# Patient Record
Sex: Female | Born: 1975
Health system: Southern US, Community
[De-identification: ages and names within clinical notes are randomized; demographics above are authoritative.]

## PROBLEM LIST (undated history)

## (undated) DIAGNOSIS — I10 Essential (primary) hypertension: Secondary | ICD-10-CM

---
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
## 2012-05-22 ENCOUNTER — Emergency Department (HOSPITAL_BASED_OUTPATIENT_CLINIC_OR_DEPARTMENT_OTHER)
Admission: EM | Admit: 2012-05-22 | Discharge: 2012-05-22 | Disposition: A | Discharge status: Home / Self Care | Payer: BC Managed Care – PPO | Attending: Emergency Medicine | Admitting: Emergency Medicine

## 2012-05-22 ENCOUNTER — Encounter (HOSPITAL_BASED_OUTPATIENT_CLINIC_OR_DEPARTMENT_OTHER): Payer: Self-pay | Admitting: Emergency Medicine

## 2012-05-22 DIAGNOSIS — I10 Essential (primary) hypertension: Secondary | ICD-10-CM | POA: Insufficient documentation

## 2012-05-22 DIAGNOSIS — R002 Palpitations: Secondary | ICD-10-CM | POA: Insufficient documentation

## 2012-05-22 HISTORY — DX: Essential (primary) hypertension: I10

## 2012-05-22 LAB — BASIC METABOLIC PANEL
BUN: 11 mg/dL (ref 6–23)
Calcium: 9.3 mg/dL (ref 8.4–10.5)
GFR calc Af Amer: 84 mL/min — ABNORMAL LOW (ref >90)
GFR calc non Af Amer: 73 mL/min — ABNORMAL LOW (ref >90)
Glucose, Bld: 107 mg/dL — ABNORMAL HIGH (ref 70–99)
Sodium: 139 mEq/L (ref 135–145)

## 2012-05-22 LAB — PREGNANCY, URINE: Preg Test, Ur: NEGATIVE

## 2012-05-22 LAB — CBC
MCHC: 34.9 g/dL (ref 30.0–36.0)
MCV: 76.4 fL — ABNORMAL LOW (ref 78.0–100.0)
RBC: 4.5 MIL/uL (ref 3.87–5.11)
RDW: 14.9 % (ref 11.5–15.5)
WBC: 6.2 10*3/uL (ref 4.0–10.5)

## 2012-05-22 MED ORDER — OXYMETAZOLINE HCL 0.05 % NA SOLN
1.0000 | Freq: Once | NASAL | Status: AC
  Administered 2012-05-22: 1 via NASAL

## 2012-05-22 MED ORDER — OXYMETAZOLINE HCL 0.05 % NA SOLN
NASAL | Status: AC
  Administered 2012-05-22: 1 via NASAL
  Filled 2012-05-22: qty 15

## 2012-05-22 NOTE — ED Provider Notes (Signed)
History     CSN: [id number]  Arrival date & time 05/22/12  4098   First MD Initiated Contact with Patient 05/22/12 [phone]      Chief Complaint  Patient presents with  . Dizziness    (Consider location/radiation/quality/duration/timing/severity/associated sxs/prior treatment) HPI Pt presents with c/o feeling lightheaded and palpitations.  Felt that her heart was racing.  She had taken a cold medication (Wal-phed) this morning shortly before symptoms began.  She states she has not had this medication in the past.  It contains pseudoephedrine.  No chest pain, no syncope.  Coworkers called 911.  Pt took the medication for nasal congestion.  She states the palpitations have mostly resolved upon arrival to the ED.  There are no other associated systemic symptoms, there are no other alleviating or modifying factors.   Past Medical History  Diagnosis Date  . Hypertension     History reviewed. No pertinent past surgical history.  No family history on file.  History  Substance Use Topics  . Smoking status: Never Smoker   . Smokeless tobacco: Not on file  . Alcohol Use: No    OB History    Grav Para Term Preterm Abortions TAB SAB Ect Mult Living                  Review of Systems ROS reviewed and all otherwise negative except for mentioned in HPI  Allergies  Review of patient's allergies indicates no known allergies.  Home Medications   Current Outpatient Rx  Name Route Sig Dispense Refill  . AMLODIPINE BESY-BENAZEPRIL HCL 5-40 MG PO CAPS Oral Take 1 capsule by mouth daily.    Marland Kitchen LEVONORGESTREL-ETHINYL ESTRAD 0.1-20 MG-MCG PO TABS Oral Take 1 tablet by mouth daily.      BP 129/84  Pulse 87  Temp 97.4 F (36.3 C) (Oral)  Resp 18  Ht 5\' 7"  (1.702 m)  Wt 313 lb (141.976 kg)  BMI 49.02 kg/m2  SpO2 100%  LMP 05/12/2012 Vitals reviewed Physical Exam Physical Examination: General appearance - alert, well appearing, and in no distress Mental status - alert, oriented to  person, place, and time Eyes - pupils equal and reactive, no conjunctival injection, no scleral icterus Mouth - mucous membranes moist, pharynx normal without lesions Chest - clear to auscultation, no wheezes, rales or rhonchi, symmetric air entry Heart - normal rate, regular rhythm, normal S1, S2, no murmurs, rubs, clicks or gallops Abdomen - soft, nontender, nondistended, no masses or organomegaly Musculoskeletal - no joint tenderness, deformity or swelling Extremities - peripheral pulses normal, no pedal edema, no clubbing or cyanosis Skin - normal coloration and turgor, no rashes  ED Course  Procedures (including critical care time)   Date: 05/22/2012  Rate: 81  Rhythm: normal sinus rhythm  QRS Axis: normal  Intervals: normal  ST/T Wave abnormalities: normal  Conduction Disutrbances: none  Narrative Interpretation: unremarkable     Labs Reviewed  CBC - Abnormal; Notable for the following:    HCT 34.4 (*)     MCV 76.4 (*)     All other components within normal limits  BASIC METABOLIC PANEL - Abnormal; Notable for the following:    Glucose, Bld 107 (*)     GFR calc non Af Amer 73 (*)     GFR calc Af Amer 84 (*)     All other components within normal limits  PREGNANCY, URINE   No results found.   1. Palpitations       MDM  Pt presenting with c/o palpitations after taking decongestant medication.  Pt states symptoms have mostly resolved upon arrival to the ED.  EKG reassuring, labs show no electrolyte abnormalities or anemia.  Urine preg negative.  Vitals also reassuring.  Discharged with strict return precautions.  Pt agreeable with plan.        Ethelda Chick, MD 05/22/12 1246

## 2012-05-22 NOTE — ED Notes (Addendum)
Pt to ED via EMS from work. Pt c/o lightheadedness and "heart racing" after taking Wal-phed at approx 0730.

## 2012-08-17 ENCOUNTER — Encounter (HOSPITAL_BASED_OUTPATIENT_CLINIC_OR_DEPARTMENT_OTHER): Payer: Self-pay | Admitting: *Deleted

## 2012-08-17 ENCOUNTER — Emergency Department (HOSPITAL_BASED_OUTPATIENT_CLINIC_OR_DEPARTMENT_OTHER): Payer: BC Managed Care – PPO

## 2012-08-17 ENCOUNTER — Emergency Department (HOSPITAL_BASED_OUTPATIENT_CLINIC_OR_DEPARTMENT_OTHER)
Admission: EM | Admit: 2012-08-17 | Discharge: 2012-08-17 | Disposition: A | Discharge status: Home / Self Care | Payer: BC Managed Care – PPO | Attending: Emergency Medicine | Admitting: Emergency Medicine

## 2012-08-17 DIAGNOSIS — S63501A Unspecified sprain of right wrist, initial encounter: Secondary | ICD-10-CM

## 2012-08-17 DIAGNOSIS — Z3202 Encounter for pregnancy test, result negative: Secondary | ICD-10-CM | POA: Insufficient documentation

## 2012-08-17 DIAGNOSIS — Z79899 Other long term (current) drug therapy: Secondary | ICD-10-CM | POA: Insufficient documentation

## 2012-08-17 DIAGNOSIS — I10 Essential (primary) hypertension: Secondary | ICD-10-CM | POA: Insufficient documentation

## 2012-08-17 DIAGNOSIS — S63502A Unspecified sprain of left wrist, initial encounter: Secondary | ICD-10-CM

## 2012-08-17 DIAGNOSIS — Y929 Unspecified place or not applicable: Secondary | ICD-10-CM | POA: Insufficient documentation

## 2012-08-17 DIAGNOSIS — S63612A Unspecified sprain of right middle finger, initial encounter: Secondary | ICD-10-CM

## 2012-08-17 DIAGNOSIS — Y9389 Activity, other specified: Secondary | ICD-10-CM | POA: Insufficient documentation

## 2012-08-17 DIAGNOSIS — S20219A Contusion of unspecified front wall of thorax, initial encounter: Secondary | ICD-10-CM

## 2012-08-17 DIAGNOSIS — S39012A Strain of muscle, fascia and tendon of lower back, initial encounter: Secondary | ICD-10-CM

## 2012-08-17 DIAGNOSIS — S161XXA Strain of muscle, fascia and tendon at neck level, initial encounter: Secondary | ICD-10-CM

## 2012-08-17 DIAGNOSIS — S139XXA Sprain of joints and ligaments of unspecified parts of neck, initial encounter: Secondary | ICD-10-CM | POA: Insufficient documentation

## 2012-08-17 DIAGNOSIS — S6390XA Sprain of unspecified part of unspecified wrist and hand, initial encounter: Secondary | ICD-10-CM | POA: Insufficient documentation

## 2012-08-17 DIAGNOSIS — S335XXA Sprain of ligaments of lumbar spine, initial encounter: Secondary | ICD-10-CM | POA: Insufficient documentation

## 2012-08-17 DIAGNOSIS — S63509A Unspecified sprain of unspecified wrist, initial encounter: Secondary | ICD-10-CM | POA: Insufficient documentation

## 2012-08-17 MED ORDER — HYDROCODONE-ACETAMINOPHEN 5-325 MG PO TABS: 2.0000 | ORAL_TABLET | Freq: Four times a day (QID) | ORAL | Status: DC | PRN

## 2012-08-17 MED ORDER — IBUPROFEN 400 MG PO TABS
400.0000 mg | ORAL_TABLET | Freq: Once | ORAL | Status: AC
  Administered 2012-08-17: 400 mg via ORAL
  Filled 2012-08-17: qty 1
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
## 2013-04-04 LAB — HM PAP SMEAR

## 2013-05-03 ENCOUNTER — Emergency Department (HOSPITAL_BASED_OUTPATIENT_CLINIC_OR_DEPARTMENT_OTHER)
Admission: EM | Admit: 2013-05-03 | Discharge: 2013-05-03 | Disposition: A | Discharge status: Home / Self Care | Payer: BC Managed Care – PPO | Attending: Emergency Medicine | Admitting: Emergency Medicine

## 2013-05-03 ENCOUNTER — Encounter (HOSPITAL_BASED_OUTPATIENT_CLINIC_OR_DEPARTMENT_OTHER): Payer: Self-pay | Admitting: *Deleted

## 2013-05-03 DIAGNOSIS — L259 Unspecified contact dermatitis, unspecified cause: Secondary | ICD-10-CM

## 2013-05-03 DIAGNOSIS — Z79899 Other long term (current) drug therapy: Secondary | ICD-10-CM | POA: Insufficient documentation

## 2013-05-03 DIAGNOSIS — I10 Essential (primary) hypertension: Secondary | ICD-10-CM | POA: Insufficient documentation

## 2013-05-03 DIAGNOSIS — R21 Rash and other nonspecific skin eruption: Secondary | ICD-10-CM | POA: Insufficient documentation

## 2013-05-03 MED ORDER — HYDROXYZINE HCL 25 MG PO TABS: 25.0000 mg | ORAL_TABLET | Freq: Four times a day (QID) | ORAL | Status: DC

## 2013-05-03 MED ORDER — PREDNISONE 50 MG PO TABS
60.0000 mg | ORAL_TABLET | Freq: Once | ORAL | Status: AC
  Administered 2013-05-03: 60 mg via ORAL
  Filled 2013-05-03: qty 1

## 2013-05-03 MED ORDER — PREDNISONE 20 MG PO TABS: ORAL_TABLET | ORAL | Status: DC

## 2013-05-03 NOTE — ED Notes (Signed)
No rash noted on hand or feet. Scale of 1 to 10, itch about 7/10. ABC intact.

## 2013-05-03 NOTE — ED Provider Notes (Signed)
CSN: [id number]     Arrival date & time 05/03/13  1609 History  This chart was scribed for Rolan Bucco, MD by Karle Plumber, ED Scribe. This patient was seen in room MH05/MH05 and the patient's care was started at 04:42 PM.    Chief Complaint  Patient presents with  . Pruritis    The history is provided by the patient. No language interpreter was used.   HPI Comments:  Shelia Mcgee is a 37 y.o. female who presents to the Emergency Department complaining of a constant worsening, severe pruritis onset two days ago. She reports it started on bilateral hands and spread to bilateral  feet. She reports some associated mild swelling of the areas. Pt denies any new foods, lotions, creams, or new medications. She denies any recent insect bites.  She states she has taken Benadryl and used some over the counter creams for the symptoms. Pt denies fever, congestion, wheezing, cough, shortness of breath, nausea, vomiting, facial or tongue swelling, rash or any other symptoms. Pt denies smoking or alcohol use.  She reports h/o HTN.    Past Medical History  Diagnosis Date  . Hypertension    History reviewed. No pertinent past surgical history. No family history on file. History  Substance Use Topics  . Smoking status: Never Smoker   . Smokeless tobacco: Not on file  . Alcohol Use: No   OB History   Grav Para Term Preterm Abortions TAB SAB Ect Mult Living                 Review of Systems  Constitutional: Negative for fever, chills, diaphoresis and fatigue.  HENT: Negative for congestion, rhinorrhea and sneezing.   Eyes: Negative.   Respiratory: Negative for cough, chest tightness and shortness of breath.   Cardiovascular: Negative for chest pain and leg swelling.  Gastrointestinal: Negative for nausea, vomiting, abdominal pain, diarrhea and blood in stool.  Genitourinary: Negative for frequency, hematuria, flank pain and difficulty urinating.  Musculoskeletal: Negative for back pain and  arthralgias.  Skin: Positive for rash.  Neurological: Negative for dizziness, speech difficulty, weakness, numbness and headaches.    Allergies  Review of patient's allergies indicates no known allergies.  Home Medications   Current Outpatient Rx  Name  Route  Sig  Dispense  Refill  . amLODipine-benazepril (LOTREL) 5-40 MG per capsule   Oral   Take 1 capsule by mouth daily.         Marland Kitchen HYDROcodone-acetaminophen (NORCO) 5-325 MG per tablet   Oral   Take 2 tablets by mouth every 6 (six) hours as needed for pain.   20 tablet   0   . hydrOXYzine (ATARAX/VISTARIL) 25 MG tablet   Oral   Take 1 tablet (25 mg total) by mouth every 6 (six) hours.   12 tablet   0   . levonorgestrel-ethinyl estradiol (AVIANE,ALESSE,LESSINA) 0.1-20 MG-MCG tablet   Oral   Take 1 tablet by mouth daily.         . predniSONE (DELTASONE) 20 MG tablet      2 tabs po daily x 4 days   8 tablet   0    Triage Vitals: BP 135/94  Pulse 82  Temp(Src) 98.2 F (36.8 C) (Oral)  Resp 20  Ht 5\' 7"  (1.702 m)  Wt 329 lb (149.233 kg)  BMI 51.52 kg/m2  SpO2 100%  LMP 04/04/2013 Physical Exam  Constitutional: She is oriented to person, place, and time. She appears well-developed and well-nourished.  HENT:  Head: Normocephalic and atraumatic.  Eyes: Pupils are equal, round, and reactive to light.  Neck: Normal range of motion. Neck supple.  Cardiovascular: Normal rate, regular rhythm and normal heart sounds.   Pulmonary/Chest: Effort normal and breath sounds normal. No respiratory distress. She has no wheezes. She has no rales. She exhibits no tenderness.  Abdominal: Soft. Bowel sounds are normal. There is no tenderness. There is no rebound and no guarding.  Musculoskeletal: Normal range of motion. She exhibits no edema.  Lymphadenopathy:    She has no cervical adenopathy.  Neurological: [name] and oriented to person, place, and time.  Skin: Skin is warm and dry. No rash noted.  Mild erythema to  the palms of both hands.  Minimal swelling.  No other rash noted.  No scaling.  Psychiatric: She has a normal mood and affect.    ED Course  Procedures (including critical care time) DIAGNOSTIC STUDIES: Oxygen Saturation is 100% on RA, normal by my interpretation.   COORDINATION OF CARE: 4:53 PM- Patient advised of plan to give a dose of prednisone in the ED and give prescription for 5 day course of prednisone and Vistaril for itching for treatment and patient agrees.   Medications  predniSONE (DELTASONE) tablet 60 mg (60 mg Oral Given 05/03/13 1652)    Labs Review Labs Reviewed - No data to display Imaging Review No results found.  MDM   1. Contact dermatitis    Pt's symptoms are likely related to a contact dermatitis.  No angioedema.  No signs of infection.  Will try steroids, atarax, f/u with her PMD for a recheck   I personally performed the services described in this documentation, which was scribed in my presence.  The recorded information has been reviewed and considered.    Rolan Bucco, MD 05/03/13 [phone]

## 2013-05-03 NOTE — ED Notes (Signed)
Pt c/o itching hands and feet since Thursday pm- hands swollen today

## 2014-01-13 ENCOUNTER — Encounter (HOSPITAL_BASED_OUTPATIENT_CLINIC_OR_DEPARTMENT_OTHER): Payer: Self-pay | Admitting: Emergency Medicine

## 2014-01-13 ENCOUNTER — Emergency Department (HOSPITAL_BASED_OUTPATIENT_CLINIC_OR_DEPARTMENT_OTHER): Payer: BC Managed Care – PPO

## 2014-01-13 ENCOUNTER — Emergency Department (HOSPITAL_BASED_OUTPATIENT_CLINIC_OR_DEPARTMENT_OTHER)
Admission: EM | Admit: 2014-01-13 | Discharge: 2014-01-13 | Disposition: A | Discharge status: Home / Self Care | Payer: BC Managed Care – PPO | Attending: Emergency Medicine | Admitting: Emergency Medicine

## 2014-01-13 DIAGNOSIS — Z79899 Other long term (current) drug therapy: Secondary | ICD-10-CM | POA: Insufficient documentation

## 2014-01-13 DIAGNOSIS — R071 Chest pain on breathing: Secondary | ICD-10-CM | POA: Insufficient documentation

## 2014-01-13 DIAGNOSIS — I10 Essential (primary) hypertension: Secondary | ICD-10-CM | POA: Insufficient documentation

## 2014-01-13 DIAGNOSIS — E669 Obesity, unspecified: Secondary | ICD-10-CM | POA: Insufficient documentation

## 2014-01-13 DIAGNOSIS — R079 Chest pain, unspecified: Secondary | ICD-10-CM

## 2014-01-13 LAB — COMPREHENSIVE METABOLIC PANEL
ALT: 12 U/L (ref 0–35)
AST: 17 U/L (ref 0–37)
Albumin: 3.7 g/dL (ref 3.5–5.2)
Alkaline Phosphatase: 43 U/L (ref 39–117)
BUN: 12 mg/dL (ref 6–23)
CHLORIDE: 101 meq/L (ref 96–112)
CO2: 22 meq/L (ref 19–32)
Calcium: 9.3 mg/dL (ref 8.4–10.5)
Creatinine, Ser: 1.1 mg/dL (ref 0.50–1.10)
GFR calc Af Amer: 74 mL/min — ABNORMAL LOW (ref >90)
GFR, EST NON AFRICAN AMERICAN: 64 mL/min — AB (ref >90)
Glucose, Bld: 93 mg/dL (ref 70–99)
Potassium: 3.8 mEq/L (ref 3.7–5.3)
SODIUM: 138 meq/L (ref 137–147)
Total Protein: 7.8 g/dL (ref 6.0–8.3)

## 2014-01-13 LAB — CBC
HCT: 37.6 % (ref 36.0–46.0)
Hemoglobin: 13.1 g/dL (ref 12.0–15.0)
MCH: 27.1 pg (ref 26.0–34.0)
MCHC: 34.8 g/dL (ref 30.0–36.0)
MCV: 77.7 fL — AB (ref 78.0–100.0)
Platelets: 351 10*3/uL (ref 150–400)
RBC: 4.84 MIL/uL (ref 3.87–5.11)
RDW: 15 % (ref 11.5–15.5)
WBC: 6.4 10*3/uL (ref 4.0–10.5)

## 2014-01-13 LAB — TROPONIN I
Troponin I: <0.3 ng/mL (ref <0.30)
Troponin I: <0.3 ng/mL (ref <0.30)

## 2014-01-13 LAB — PROTIME-INR
INR: 0.97 (ref 0.00–1.49)
Prothrombin Time: 12.7 seconds (ref 11.6–15.2)

## 2014-01-13 LAB — APTT: aPTT: 29 seconds (ref 24–37)

## 2014-01-13 MED ORDER — ASPIRIN 81 MG PO CHEW
324.0000 mg | CHEWABLE_TABLET | Freq: Once | ORAL | Status: AC
  Administered 2014-01-13: 324 mg via ORAL
  Filled 2014-01-13: qty 4

## 2014-01-13 MED ORDER — SODIUM CHLORIDE 0.9 % IV SOLN
1000.0000 mL | INTRAVENOUS | Status: DC
  Administered 2014-01-13: 1000 mL via INTRAVENOUS

## 2014-01-13 NOTE — ED Notes (Signed)
Patient transported to X-ray via stretcher per tech. 

## 2014-01-13 NOTE — Discharge Instructions (Signed)
Chest Pain (Nonspecific) °It is often hard to give a specific diagnosis for the cause of chest pain. There is always a chance that your pain could be related to something serious, such as a heart attack or a blood clot in the lungs. You need to follow up with your caregiver for further evaluation. °CAUSES  °· Heartburn. °· Pneumonia or bronchitis. °· Anxiety or stress. °· Inflammation around your heart (pericarditis) or lung (pleuritis or pleurisy). °· A blood clot in the lung. °· A collapsed lung (pneumothorax). It can develop suddenly on its own (spontaneous pneumothorax) or from injury (trauma) to the chest. °· Shingles infection (herpes zoster virus). °The chest wall is composed of bones, muscles, and cartilage. Any of these can be the source of the pain. °· The bones can be bruised by injury. °· The muscles or cartilage can be strained by coughing or overwork. °· The cartilage can be affected by inflammation and become sore (costochondritis). °DIAGNOSIS  °Lab tests or other studies, such as X-rays, electrocardiography, stress testing, or cardiac imaging, may be needed to find the cause of your pain.  °TREATMENT  °· Treatment depends on what may be causing your chest pain. Treatment may include: °· Acid blockers for heartburn. °· Anti-inflammatory medicine. °· Pain medicine for inflammatory conditions. °· Antibiotics if an infection is present. °· You may be advised to change lifestyle habits. This includes stopping smoking and avoiding alcohol, caffeine, and chocolate. °· You may be advised to keep your head raised (elevated) when sleeping. This reduces the chance of acid going backward from your stomach into your esophagus. °· Most of the time, nonspecific chest pain will improve within 2 to 3 days with rest and mild pain medicine. °HOME CARE INSTRUCTIONS  °· If antibiotics were prescribed, take your antibiotics as directed. Finish them even if you start to feel better. °· For the next few days, avoid physical  activities that bring on chest pain. Continue physical activities as directed. °· Do not smoke. °· Avoid drinking alcohol. °· Only take over-the-counter or prescription medicine for pain, discomfort, or fever as directed by your caregiver. °· Follow your caregiver's suggestions for further testing if your chest pain does not go away. °· Keep any follow-up appointments you made. If you do not go to an appointment, you could develop lasting (chronic) problems with pain. If there is any problem keeping an appointment, you must call to reschedule. °SEEK MEDICAL CARE IF:  °· You think you are having problems from the medicine you are taking. Read your medicine instructions carefully. °· Your chest pain does not go away, even after treatment. °· You develop a rash with blisters on your chest. °SEEK IMMEDIATE MEDICAL CARE IF:  °· You have increased chest pain or pain that spreads to your arm, neck, jaw, back, or abdomen. °· You develop shortness of breath, an increasing cough, or you are coughing up blood. °· You have severe back or abdominal pain, feel nauseous, or vomit. °· You develop severe weakness, fainting, or chills. °· You have a fever. °THIS IS AN EMERGENCY. Do not wait to see if the pain will go away. Get medical help at once. Call your local emergency services (911 in [place] Do not drive yourself to the hospital. °MAKE SURE YOU:  °· Understand these instructions. °· Will watch your condition. °· Will get help right away if you are not doing well or get worse. °Document Released: 05/31/2005 Document Revised: 11/13/2011 Document Reviewed: 03/26/2008 °[employer]® Patient Information ©2014 [employer]. ° °

## 2014-01-13 NOTE — ED Notes (Signed)
Pt ambulatory to restroom no difficulty.  

## 2014-01-13 NOTE — ED Provider Notes (Signed)
CSN: [id number]     Arrival date & time 01/13/14  1737 History  This chart was scribed for Shelia KrasJon R Rolen Conger, MD by Dorothey Basemania Sutton, ED Scribe. This patient was seen in room MH11/MH11 and the patient's care was started at 6:04 PM.    Chief Complaint  Patient presents with  . Chest Pain   The history is provided by the patient. No language interpreter was used.   HPI Comments: Shelia Mcgee is a 38 y.o. Female with a history of HTN who presents to the Emergency Department complaining of an intermittent, pressure-like pain to left side of the chest onset yesterday that she states has been progressively worsening to a constant pain over the last 5 hours. She reports that the pain began radiating into the left arm about 3 hours ago. She states that these symptoms are new for her. Patient denies any exacerbating or alleviating factors. She denies shortness of breath, nausea, vomiting, abdominal pain, diaphoresis, leg swelling. She denies current steroid use. Patient uses oral contraceptive. She denies history of DVT/PE or other cardiac problems. Patient has no other pertinent medical history.   Past Medical History  Diagnosis Date  . Hypertension    History reviewed. No pertinent past surgical history. No family history on file. History  Substance Use Topics  . Smoking status: Never Smoker   . Smokeless tobacco: Not on file  . Alcohol Use: No   OB History   Grav Para Term Preterm Abortions TAB SAB Ect Mult Living                 Review of Systems  Constitutional: Negative for diaphoresis.  Respiratory: Negative for shortness of breath.   Cardiovascular: Positive for chest pain. Negative for leg swelling.  Gastrointestinal: Negative for nausea, vomiting and abdominal pain.  All other systems reviewed and are negative.  Allergies  Review of patient's allergies indicates no known allergies.  Home Medications   Prior to Admission medications   Medication Sig Start Date End Date Taking?  Authorizing Provider  amLODipine-benazepril (LOTREL) 5-40 MG per capsule Take 1 capsule by mouth daily.    Historical Provider, MD  HYDROcodone-acetaminophen (NORCO) 5-325 MG per tablet Take 2 tablets by mouth every 6 (six) hours as needed for pain. 08/17/12   Hurman HornJohn M Bednar, MD  hydrOXYzine (ATARAX/VISTARIL) 25 MG tablet Take 1 tablet (25 mg total) by mouth every 6 (six) hours. 05/03/13   Rolan BuccoMelanie Belfi, MD  levonorgestrel-ethinyl estradiol (AVIANE,ALESSE,LESSINA) 0.1-20 MG-MCG tablet Take 1 tablet by mouth daily.    Historical Provider, MD          Triage Vitals: BP 141/89  Pulse 78  Temp(Src) 98.5 F (36.9 C) (Oral)  Resp 20  Ht 5\' 7"  (1.702 m)  Wt 342 lb (155.13 kg)  BMI 53.55 kg/m2  SpO2 99%  LMP 12/23/2013  Physical Exam  Nursing note and vitals reviewed. Constitutional: She is oriented to person, place, and time. She appears well-developed and well-nourished. No distress.  Obese.   HENT:  Head: Normocephalic and atraumatic.  Eyes: Conjunctivae are normal.  Neck: Normal range of motion. Neck supple.  Cardiovascular: Normal rate, regular rhythm and normal heart sounds.   Pulmonary/Chest: Effort normal and breath sounds normal. No respiratory distress. She exhibits tenderness.  Mild tenderness to palpation to the anterior chest wall.   Abdominal: Soft. She exhibits no distension. There is no tenderness.  Musculoskeletal: Normal range of motion.  Neurological: [name] and oriented to person, place, and time.  Skin: Skin is warm and dry.  Psychiatric: She has a normal mood and affect. Her behavior is normal.    ED Course  Procedures (including critical care time)  DIAGNOSTIC STUDIES: Oxygen Saturation is 99% on room air, normal by my interpretation.    COORDINATION OF CARE: 6:08 PM- Discussed that initial EKG results were normal. Ordered troponin, APTT, CBC, CMP, Protime-INR, and a chest x-ray. Ordered IV fluids and aspirin to manage symptoms. Discussed treatment plan  with patient at bedside and patient verbalized agreement.   7:47 PM- Discussed that preliminary lab and imaging results are normal. Ordered an additional troponin and will continue to monitor the patient. Discussed treatment plan with patient at bedside and patient verbalized agreement.   9:39 PM- Discussed normal lab and imaging results. Patient stable for discharge. Advised her to follow up with a PCP. Discussed treatment plan with patient at bedside and patient verbalized agreement.    Labs Review Labs Reviewed  CBC - Abnormal; Notable for the following:    MCV 77.7 (*)    All other components within normal limits  COMPREHENSIVE METABOLIC PANEL - Abnormal; Notable for the following:    Total Bilirubin <0.2 (*)    GFR calc non Af Amer 64 (*)    GFR calc Af Amer 74 (*)    All other components within normal limits  APTT  PROTIME-INR  TROPONIN I  TROPONIN I    Imaging Review Dg Chest 2 View  01/13/2014   CLINICAL DATA:  Left-sided chest pain radiating down the left arm.  EXAM: CHEST  2 VIEW  COMPARISON:  Chest x-ray 08/17/2012.  FINDINGS: Lung volumes are normal. No consolidative airspace disease. No pleural effusions. No pneumothorax. No pulmonary nodule or mass noted. Pulmonary vasculature and the cardiomediastinal silhouette are within normal limits.  IMPRESSION: No radiographic evidence of acute cardiopulmonary disease.   Electronically Signed   By: Trudie Reedaniel  Entrikin M.D.   On: 01/13/2014 18:41     EKG Interpretation   Date/Time:  Tuesday Jan 13 2014 17:42:09 EDT Ventricular Rate:  76 PR Interval:  148 QRS Duration: 80 QT Interval:  378 QTC Calculation: 425 R Axis:   66 Text Interpretation:  Normal sinus rhythm Normal ECG No significant change  since last tracing Confirmed by Jennefer Kopp  MD-J, Tonyia Marschall ([id number]) on 01/13/2014  5:46:30 PM      MDM   Final diagnoses:  Chest pain    Pt with history of HTN but otherwise no known CAD or Hx of PE.  2 sets of negative cardiac enzymes.   Symptoms since yesterday.  Doubt ACS, PE. Follow up with PCP.  Warning signs and precautions discussed.  I personally performed the services described in this documentation, which was scribed in my presence.  The recorded information has been reviewed and is accurate.    Shelia KrasJon R Rosealee Recinos, MD 01/13/14 [phone]

## 2014-01-13 NOTE — ED Notes (Signed)
Pressure in her left chest since yesterday. States she is having radiation into her left arm.

## 2014-03-06 ENCOUNTER — Emergency Department (INDEPENDENT_AMBULATORY_CARE_PROVIDER_SITE_OTHER)
Admission: EM | Admit: 2014-03-06 | Discharge: 2014-03-06 | Disposition: A | Discharge status: Home / Self Care | Payer: BC Managed Care – PPO | Source: Home / Self Care

## 2014-03-06 ENCOUNTER — Encounter: Payer: Self-pay | Admitting: Emergency Medicine

## 2014-03-06 DIAGNOSIS — B3731 Acute candidiasis of vulva and vagina: Secondary | ICD-10-CM

## 2014-03-06 DIAGNOSIS — B373 Candidiasis of vulva and vagina: Secondary | ICD-10-CM

## 2014-03-06 LAB — POCT URINALYSIS DIP (MANUAL ENTRY)
Bilirubin, UA: NEGATIVE
Blood, UA: NEGATIVE
Glucose, UA: NEGATIVE
Ketones, POC UA: NEGATIVE
Leukocytes, UA: NEGATIVE
Nitrite, UA: NEGATIVE
Protein Ur, POC: NEGATIVE
Spec Grav, UA: 1.015 (ref 1.005–1.03)
Urobilinogen, UA: 0.2 (ref 0–1)
pH, UA: 7 (ref 5–8)

## 2014-03-06 MED ORDER — FLUCONAZOLE 200 MG PO TABS: 200.0000 mg | ORAL_TABLET | Freq: Every day | ORAL | Status: DC

## 2014-03-06 NOTE — ED Notes (Signed)
Reports itching and light discharge on perineum x 5 days; felt it was yeast infection and tried OTC monistat-1 day tx which did not resolve problem. Some burning upon urination.

## 2014-03-06 NOTE — ED Provider Notes (Signed)
CSN: [id number]     Arrival date & time 03/06/14  1712 History   None    Chief Complaint  Patient presents with  . Vaginitis   (Consider location/radiation/quality/duration/timing/severity/associated sxs/prior Treatment) HPI Reports itching and light nonodorous vaginal discharge  x 5 days; she felt it was yeast infection and tried OTC monistat-1 day tx which did not resolve problem. Some burning upon urination.  Denies back or abdominal pain or nausea or vomiting or fever or chills. She is not sexually active and denies chance of pregnancy. On BCPs to regulate her periods. Her last menstrual period was about 3 weeks ago. Past Medical History  Diagnosis Date  . Hypertension    History reviewed. No pertinent past surgical history. Family History  Problem Relation Age of Onset  . Hypertension Mother   . Hypertension Father   . Diabetes Father    History  Substance Use Topics  . Smoking status: Never Smoker   . Smokeless tobacco: Not on file  . Alcohol Use: No   OB History   Grav Para Term Preterm Abortions TAB SAB Ect Mult Living                 Review of Systems  All other systems reviewed and are negative.   Allergies  Review of patient's allergies indicates no known allergies.  Home Medications   Prior to Admission medications   Medication Sig Start Date End Date Taking? Authorizing Provider  amLODipine-benazepril (LOTREL) 5-40 MG per capsule Take 1 capsule by mouth daily.    Historical Provider, MD  fluconazole (DIFLUCAN) 200 MG tablet Take 1 tablet (200 mg total) by mouth daily. For 3 days.  For yeast infection 03/06/14   Lajean Manesavid Massey, MD  levonorgestrel-ethinyl estradiol (AVIANE,ALESSE,LESSINA) 0.1-20 MG-MCG tablet Take 1 tablet by mouth daily.    Historical Provider, MD   BP 150/88  Pulse 72  Temp(Src) 98.2 F (36.8 C) (Oral)  Resp 16  Ht 5\' 7"  (1.702 m)  Wt 329 lb (149.233 kg)  BMI 51.52 kg/m2  SpO2 100%  LMP 02/10/2014 Physical Exam  Nursing note and  vitals reviewed. Constitutional: She is oriented to person, place, and time. She appears well-developed and well-nourished. No distress.  HENT:  Head: Normocephalic and atraumatic.  Eyes: Conjunctivae and EOM are normal. Pupils are equal, round, and reactive to light. No scleral icterus.  Neck: Normal range of motion.  Cardiovascular: Normal rate.   Pulmonary/Chest: Effort normal.  Abdominal: She exhibits no distension.  Musculoskeletal: Normal range of motion.  Neurological: [name] and oriented to person, place, and time.  Skin: Skin is warm.  Psychiatric: She has a normal mood and affect.   She declined pelvic exam and preferred to simply check urinalysis and she performed manual vaginal swab for us to evaluate under microscope ED Course  Procedures (including critical care time) Labs Review Labs Reviewed  POCT URINALYSIS DIP (MANUAL ENTRY)  POCT WET + KOH PREP ([facility])   Results for orders placed during the hospital encounter of 03/06/14  POCT URINALYSIS DIP (MANUAL ENTRY)      Result Value Ref Range   Color, UA yellow     Clarity, UA clear     Glucose, UA neg     Bilirubin, UA negative     Bilirubin, UA negative     Spec Grav, UA 1.015  1.005 - 1.03   Blood, UA negative     pH, UA 7.0  5 - 8  Protein Ur, POC negative     Urobilinogen, UA 0.2  0 - 1   Nitrite, UA Negative     Leukocytes, UA Negative      Wet prep: Positive for WBCs and yeast. Rare clue cells seen.  MDM   1. Yeast infection of the vagina    Treatment options discussed, as well as risks, benefits, alternatives. Patient voiced understanding and agreement with the following plans: Diflucan 200 mg by mouth daily x3 days. Other advice given. Followup with PCP if no better one week, sooner if worse or new symptoms. Precautions discussed. Red flags discussed. Questions invited and answered. Patient voiced understanding and agreement.     Lajean Manesavid Massey, MD 03/06/14 [phone]

## 2014-04-29 ENCOUNTER — Encounter: Payer: Self-pay | Admitting: Family

## 2014-04-29 ENCOUNTER — Ambulatory Visit (INDEPENDENT_AMBULATORY_CARE_PROVIDER_SITE_OTHER): Payer: BC Managed Care – PPO | Admitting: Family

## 2014-04-29 VITALS — BP 134/92 | HR 78 | Temp 98.2°F | Resp 16 | Ht 67.0 in | Wt 320.1 lb

## 2014-04-29 DIAGNOSIS — I10 Essential (primary) hypertension: Secondary | ICD-10-CM

## 2014-04-29 DIAGNOSIS — N760 Acute vaginitis: Secondary | ICD-10-CM

## 2014-04-29 DIAGNOSIS — M79609 Pain in unspecified limb: Secondary | ICD-10-CM

## 2014-04-29 DIAGNOSIS — Z23 Encounter for immunization: Secondary | ICD-10-CM

## 2014-04-29 DIAGNOSIS — M79674 Pain in right toe(s): Secondary | ICD-10-CM

## 2014-04-29 DIAGNOSIS — Z0000 Encounter for general adult medical examination without abnormal findings: Secondary | ICD-10-CM

## 2014-04-29 DIAGNOSIS — G8929 Other chronic pain: Secondary | ICD-10-CM

## 2014-04-29 HISTORY — DX: Essential (primary) hypertension: I10

## 2014-04-29 HISTORY — DX: Acute vaginitis: N76.0

## 2014-04-29 MED ORDER — FLUCONAZOLE 150 MG PO TABS: ORAL_TABLET | ORAL | Status: DC

## 2014-04-29 MED ORDER — NYSTATIN 100000 UNIT/GM EX POWD: CUTANEOUS | Status: DC

## 2014-04-29 NOTE — Patient Instructions (Signed)
Please complete lab work prior to leaving. Schedule non-fasting physical at the front desk. Welcome to [facility]!

## 2014-04-29 NOTE — Assessment & Plan Note (Signed)
Fair BP today. Continue lotrel. She is on ocp and I reminded her not to become pregnant while on lotrel.  She verbalizes understanding.

## 2014-04-29 NOTE — Progress Notes (Signed)
Pre visit review using our clinic review tool, if applicable. No additional management support is needed unless otherwise documented below in the visit note. 

## 2014-04-29 NOTE — Assessment & Plan Note (Signed)
Will obtain uric acid level to exclude gout. Tylenol prn.

## 2014-04-29 NOTE — Progress Notes (Signed)
Subjective:    Patient ID: Shelia Mcgee, female    DOB: [birth year], 38 y.o.   MRN: [id number]  HPI  Shelia Mcgee is a 38 yr old female who presents today to establish care.  1) Intermittent right great toe pain x 3 weeks. Reports associated joint stiffness x 1-2 months. Pain is located in the distal right toe.  Denies family hx of gout. Using tylenol with mild improvement in her symptoms.    2) Vaginal itching and discharge x 1 month, not relieved by otc meds. Has tried monistat 3 and monitstat 7.  Reports that she went to an urgent care last month and was told that she had yeast and was given a 3 day oral treatment ? Diflucan.  Noted that treatment helped initially.    3) HTN- maintained on lotrel.  She was diagnosed at age 42.  Tolerating lotrel.     Review of Systems  Constitutional:       Reports that she has lost 28 pounds since February  HENT: Negative for hearing loss and postnasal drip.   Eyes: Negative for visual disturbance.  Respiratory: Negative for cough.   Cardiovascular: Negative for chest pain.  Gastrointestinal: Negative for nausea, vomiting and diarrhea.  Genitourinary: Positive for vaginal discharge. Negative for dysuria and frequency.  Musculoskeletal: Negative for back pain.       Some hip stiffness and knee pain  Skin: Positive for rash.       Reports that she develops itching/irritation in skin fold  Neurological: Negative for headaches.  Hematological: Negative for adenopathy.  Psychiatric/Behavioral:       Denies depression/anxiety   Past Medical History  Diagnosis Date  . Hypertension     History   Social History  . Marital Status: Single    Spouse Name: N/A    Number of Children: N/A  . Years of Education: N/A   Occupational History  . Not on file.   Social History Main Topics  . Smoking status: Never Smoker   . Smokeless tobacco: Never Used  . Alcohol Use: No  . Drug Use: No  . Sexual Activity: No   Other Topics Concern  . 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 Hip surgery Right 1992    States that bones were slipping. Stabilized it with 3 screws x 1 yr then had screws removed.    Family History  Problem Relation Age of Onset  . Hypertension Mother   . Hypertension Father   . Diabetes Father   . Heart attack Maternal Uncle     No Known Allergies  Current Outpatient Prescriptions on File Prior to Visit  Medication Sig Dispense Refill  . amLODipine-benazepril (LOTREL) 5-40 MG per capsule Take 1 capsule by mouth daily.      Marland Kitchen levonorgestrel-ethinyl estradiol (AVIANE,ALESSE,LESSINA) 0.1-20 MG-MCG tablet Take 1 tablet by mouth daily.       No current facility-administered medications on file prior to visit.    BP 134/92  Pulse 78  Temp(Src) 98.2 F (36.8 C) (Oral)  Resp 16  Ht  (1.702 m)  Wt 320 lb 1.9 oz (145.205 kg)  BMI 50.13 kg/m2  SpO2 99%  LMP 04/13/2014       Objective:  Physical Exam  Constitutional: She is oriented to person, place, and time. She appears well-developed and well-nourished. No distress.  Morbidly obese AA female  Cardiovascular: Normal rate and regular rhythm.   No murmur heard. Pulmonary/Chest: Effort normal and breath sounds normal. No respiratory distress. She has no wheezes. She has no rales. She exhibits no tenderness.  Genitourinary: Vaginal discharge found.  Neurological: [name] and oriented to person, place, and time.  Psychiatric: She has a normal mood and affect. Her behavior is normal. Judgment and thought content normal.          Assessment & Plan:

## 2014-04-29 NOTE — Assessment & Plan Note (Addendum)
Wet prep/GC Chlamydia testing performed today. Empiric rx for yeast based on exam with diflucan.  Further recs pending results.

## 2014-04-29 NOTE — Addendum Note (Signed)
Addended by: Mervin Kung A on: 04/29/2014 09:36 AM   Modules accepted: Orders

## 2014-04-30 LAB — WET PREP BY MOLECULAR PROBE
CANDIDA SPECIES: POSITIVE — AB
Gardnerella vaginalis: NEGATIVE
TRICHOMONAS VAG: NEGATIVE

## 2014-04-30 LAB — GC/CHLAMYDIA PROBE AMP
CT PROBE, AMP APTIMA: NEGATIVE
GC PROBE AMP APTIMA: NEGATIVE

## 2014-05-01 ENCOUNTER — Encounter: Payer: Self-pay | Admitting: Family

## 2014-05-22 ENCOUNTER — Encounter: Payer: Self-pay | Admitting: Family

## 2014-05-22 ENCOUNTER — Ambulatory Visit (INDEPENDENT_AMBULATORY_CARE_PROVIDER_SITE_OTHER): Payer: BC Managed Care – PPO | Admitting: Family

## 2014-05-22 VITALS — BP 134/87 | HR 79 | Temp 98.4°F | Resp 16 | Ht 67.0 in | Wt 322.6 lb

## 2014-05-22 DIAGNOSIS — M79674 Pain in right toe(s): Secondary | ICD-10-CM

## 2014-05-22 DIAGNOSIS — Z0000 Encounter for general adult medical examination without abnormal findings: Secondary | ICD-10-CM

## 2014-05-22 DIAGNOSIS — M79609 Pain in unspecified limb: Secondary | ICD-10-CM

## 2014-05-22 DIAGNOSIS — B351 Tinea unguium: Secondary | ICD-10-CM

## 2014-05-22 DIAGNOSIS — R0789 Other chest pain: Secondary | ICD-10-CM

## 2014-05-22 LAB — CBC WITH DIFFERENTIAL/PLATELET
BASOS PCT: 0.5 % (ref 0.0–3.0)
Basophils Absolute: 0 10*3/uL (ref 0.0–0.1)
EOS ABS: 0 10*3/uL (ref 0.0–0.7)
Eosinophils Relative: 0.3 % (ref 0.0–5.0)
HCT: 38.5 % (ref 36.0–46.0)
Hemoglobin: 12.6 g/dL (ref 12.0–15.0)
LYMPHS PCT: 31 % (ref 12.0–46.0)
Lymphs Abs: 2.3 10*3/uL (ref 0.7–4.0)
MCHC: 32.6 g/dL (ref 30.0–36.0)
MCV: 82.4 fl (ref 78.0–100.0)
Monocytes Absolute: 0.5 10*3/uL (ref 0.1–1.0)
Monocytes Relative: 6.3 % (ref 3.0–12.0)
NEUTROS PCT: 61.9 % (ref 43.0–77.0)
Neutro Abs: 4.5 10*3/uL (ref 1.4–7.7)
Platelets: 358 10*3/uL (ref 150.0–400.0)
RBC: 4.67 Mil/uL (ref 3.87–5.11)
RDW: 15.4 % (ref 11.5–15.5)
WBC: 7.3 10*3/uL (ref 4.0–10.5)

## 2014-05-22 LAB — URINALYSIS, ROUTINE W REFLEX MICROSCOPIC
Bilirubin Urine: NEGATIVE
HGB URINE DIPSTICK: NEGATIVE
KETONES UR: NEGATIVE
Leukocytes, UA: NEGATIVE
Nitrite: NEGATIVE
RBC / HPF: NONE SEEN (ref 0–?)
Specific Gravity, Urine: <=1.005 — AB (ref 1.000–1.030)
Total Protein, Urine: NEGATIVE
URINE GLUCOSE: NEGATIVE
Urobilinogen, UA: 0.2 (ref 0.0–1.0)
WBC, UA: NONE SEEN (ref 0–?)
pH: 6 (ref 5.0–8.0)

## 2014-05-22 LAB — BASIC METABOLIC PANEL
BUN: 12 mg/dL (ref 6–23)
CALCIUM: 8.8 mg/dL (ref 8.4–10.5)
CO2: 23 meq/L (ref 19–32)
CREATININE: 1.1 mg/dL (ref 0.4–1.2)
Chloride: 104 mEq/L (ref 96–112)
GFR: 72.27 mL/min (ref >60.00)
Glucose, Bld: 80 mg/dL (ref 70–99)
Potassium: 3.7 mEq/L (ref 3.5–5.1)
Sodium: 135 mEq/L (ref 135–145)

## 2014-05-22 LAB — LIPID PANEL
CHOL/HDL RATIO: 4
Cholesterol: 161 mg/dL (ref 0–200)
HDL: 43.7 mg/dL (ref >39.00)
LDL CALC: 91 mg/dL (ref 0–99)
NonHDL: 117.3
Triglycerides: 132 mg/dL (ref 0.0–149.0)
VLDL: 26.4 mg/dL (ref 0.0–40.0)

## 2014-05-22 LAB — HEPATIC FUNCTION PANEL
ALT: 17 U/L (ref 0–35)
AST: 20 U/L (ref 0–37)
Albumin: 3.9 g/dL (ref 3.5–5.2)
Alkaline Phosphatase: 38 U/L — ABNORMAL LOW (ref 39–117)
BILIRUBIN DIRECT: 0 mg/dL (ref 0.0–0.3)
BILIRUBIN TOTAL: 0.3 mg/dL (ref 0.2–1.2)
TOTAL PROTEIN: 8 g/dL (ref 6.0–8.3)

## 2014-05-22 LAB — TSH: TSH: 0.35 u[IU]/mL (ref 0.35–4.50)

## 2014-05-22 NOTE — Assessment & Plan Note (Signed)
Will refer to podiatry for onychomycosis and to address pain in the right nailbed.

## 2014-05-22 NOTE — Progress Notes (Signed)
Pre visit review using our clinic review tool, if applicable. No additional management support is needed unless otherwise documented below in the visit note. 

## 2014-05-22 NOTE — Progress Notes (Signed)
Subjective:    Patient ID: Shelia Mcgee, female    DOB: 1976/08/20, 38 y.o.   MRN: [id number]  HPI  Patient presents today for complete physical.  Immunizations: tetanus/flu up to date Diet: could be better,  Wt Readings from Last 3 Encounters:  05/22/14 322 lb 9.6 oz (146.33 kg)  04/29/14 320 lb 1.9 oz (145.205 kg)  03/06/14 329 lb (149.233 kg)  Exercise: using fit bit, getting regular exercise Pap Smear: up to date Dental: due  R great toenail pain/thickening- reports that it hurts to press down on her right great toenail.    Atypical chest pain- Occasional left sided chest pain with exercise. Does not occur every time.  No associated SOB, non-radiating, no nausea. Resolves if she continues to exercise.  Might have occurred once at rest but she is not sure.   Review of Systems  HENT: Negative for rhinorrhea.   Respiratory: Negative for cough.   Gastrointestinal: Negative for nausea, vomiting and diarrhea.  Genitourinary: Negative for urgency, frequency and menstrual problem.  Musculoskeletal: Positive for arthralgias.  Skin: Negative for rash.  Psychiatric/Behavioral:       Denies depression/anxiety   Past Medical History  Diagnosis Date  . Hypertension     History   Social History  . Marital Status: Single    Spouse Name: N/A    Number of Children: N/A  . Years of Education: N/A   Occupational History  . Not on file.   Social History Main Topics  . Smoking status: Never Smoker   . Smokeless tobacco: Never Used  . Alcohol Use: No  . Drug Use: No  . Sexual Activity: No   Other Topics Concern  . 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 Hip surgery Right 1992    States that bones were slipping. Stabilized it with 3 screws x 1 yr then  had screws removed.    Family History  Problem Relation Age of Onset  . Hypertension Mother   . Hypertension Father   . Diabetes Father   . Heart attack Maternal Uncle     No Known Allergies  Current Outpatient Prescriptions on File Prior to Visit  Medication Sig Dispense Refill  . amLODipine-benazepril (LOTREL) 5-40 MG per capsule Take 1 capsule by mouth daily.      Marland Kitchen levonorgestrel-ethinyl estradiol (AVIANE,ALESSE,LESSINA) 0.1-20 MG-MCG tablet Take 1 tablet by mouth daily.      . Multiple Vitamins-Minerals (MULTIVITAMIN WITH MINERALS) tablet Take 1 tablet by mouth daily.      . nabumetone (RELAFEN) 750 MG tablet Take 750 mg by mouth 2 (two) times daily as needed.      . nystatin (MYCOSTATIN/NYSTOP) 100000 UNIT/GM POWD Apply twice daily to affected area  30 g  1   No current facility-administered medications on file prior to visit.    BP 134/87  Pulse 79  Temp(Src) 98.4 F (36.9 C) (Oral)  Resp 16  Ht  (1.702 m)  Wt 322 lb 9.6 oz (146.33 kg)  BMI 50.51 kg/m2  SpO2 100%  LMP 05/12/2014       Objective:   Physical Exam Physical Exam  Constitutional: Morbidly obese AA female. She is oriented to person, place, and time. She appears well-developed and well-nourished. No  distress.  HENT:  Head: Normocephalic and atraumatic.  Right Ear: Tympanic membrane and ear canal normal.  Left Ear: Tympanic membrane and ear canal normal.  Mouth/Throat: Oropharynx is clear and moist.  Eyes: Pupils are equal, round, and reactive to light. No scleral icterus.  Neck: Normal range of motion. No thyromegaly present.  Cardiovascular: Normal rate and regular rhythm.   No murmur heard. Pulmonary/Chest: Effort normal and breath sounds normal. No respiratory distress. He has no wheezes. She has no rales. She exhibits no tenderness.  Abdominal: Soft. Bowel sounds are normal. He exhibits no distension and no mass. There is no tenderness. There is no rebound and no guarding.    Musculoskeletal: She exhibits no edema.  Lymphadenopathy:    She has no cervical adenopathy.  Neurological: [name] and oriented to person, place, and time.  She exhibits normal muscle tone. Coordination normal.  Skin: Skin is warm and dry. toenails are thickened. R great toe nailbed is without erythema/swelling.  Nail does appear to be slightly lifted from the nailbed.   Psychiatric: She has a normal mood and affect. Her behavior is normal. Judgment and thought content normal.  Breasts: Examined lying Right: Without masses, retractions, discharge or axillary adenopathy.  Left: Without masses, retractions, discharge or axillary adenopathy.  Pelvic: deferred     Assessment & Plan:          Assessment & Plan:

## 2014-05-22 NOTE — Addendum Note (Signed)
Addended by: Verdie Shire on: 05/22/2014 08:05 AM   Modules accepted: Orders

## 2014-05-22 NOTE — Assessment & Plan Note (Signed)
Immunizations reviewed and up to date.  Obtain fasting labs.  Advised pt to schedule routine dental follow up.

## 2014-05-22 NOTE — Patient Instructions (Addendum)
Please complete lab work prior to leaving.  Continue to work on [employer], exercise, weight loss. You will be contacted about your referral to podiatry and stress test. Schedule follow up dental.  Follow up in 3 months for blood pressure recheck.

## 2014-05-22 NOTE — Addendum Note (Signed)
Addended by: Eustace Quail on: 05/22/2014 08:13 AM   Modules accepted: Orders

## 2014-05-22 NOTE — Addendum Note (Signed)
Addended by: Eustace Quail on: 05/22/2014 08:21 AM   Modules accepted: Orders

## 2014-05-22 NOTE — Assessment & Plan Note (Addendum)
EKG is performed today and reviewed. It shows NSR without acute or ischemic changes.  Will refer for exercise myoview. Pt is advised to go to the ED if she develops severe or recurrent chest pain that does not resolve on its own.

## 2014-05-23 LAB — HIV ANTIBODY (ROUTINE TESTING W REFLEX): HIV 1&2 Ab, 4th Generation: NONREACTIVE

## 2014-05-24 ENCOUNTER — Encounter: Payer: Self-pay | Admitting: Family

## 2014-06-02 ENCOUNTER — Encounter (HOSPITAL_COMMUNITY): Payer: BC Managed Care – PPO

## 2014-06-03 ENCOUNTER — Encounter: Payer: Self-pay | Admitting: Podiatrist

## 2014-06-03 ENCOUNTER — Encounter: Payer: Self-pay | Admitting: *Deleted

## 2014-06-03 ENCOUNTER — Ambulatory Visit (INDEPENDENT_AMBULATORY_CARE_PROVIDER_SITE_OTHER): Payer: BC Managed Care – PPO | Admitting: Podiatrist

## 2014-06-03 VITALS — BP 119/79 | HR 90 | Resp 16 | Ht 67.0 in | Wt 319.0 lb

## 2014-06-03 DIAGNOSIS — B351 Tinea unguium: Secondary | ICD-10-CM

## 2014-06-03 MED ORDER — EFINACONAZOLE 10 % EX SOLN: 1.0000 [drp] | Freq: Every day | CUTANEOUS | Status: DC

## 2014-06-03 NOTE — Progress Notes (Signed)
   Subjective:    Patient ID: Shelia Mcgee, female    DOB: 08/29/1976, 38 y.o.   MRN: [id number]  HPI Comments: "I have a bad looking nail"  Patient c/o thick, dark nail 1st nail left for about 2 months. She has tried OTC powders and trims-no help.     Review of Systems  Musculoskeletal: Positive for arthralgias.  All other systems reviewed and are negative.      Objective:   Physical Exam Patient is awake, alert, and oriented x 3.  In no acute distress.  Vascular status is intact with palpable pedal pulses at 2/4 DP and PT bilateral and capillary refill time within normal limits. Neurological sensation is also intact bilaterally via Semmes Weinstein monofilament at 5/5 sites. Light touch, vibratory sensation, Achilles tendon reflex is intact. Dermatological exam reveals skin color, turger and texture as normal. No open lesions present.  Musculature intact with dorsiflexion, plantarflexion, inversion, eversion.  Left hallux nail is slightly darkened with a yellowish brownish discoloration and streaking at the distal portion of the toenail. It does appear to be mycotic. It is not painful or deformed at this time. 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 It is not bothering her either.    Assessment & Plan:  Mycotic toenail left first, brachymetatarsia   plan: The sample of the toenail to send for culture. Started her on [place]ublia. Also discussed surgery to correct her fourth toes and discussed mini rail with callus distraction. She will consider the option and call if she would like to consider surgery. She will call with the Jublia is not effective in 3 months. We will consider Lamisil at that time.

## 2014-06-03 NOTE — Patient Instructions (Addendum)
The short toes you have are due to a condition called "brachymetatarsia"-- fixing it is a pretty involved and lenthy process called callus distraction with a mini external fixator.  I would encourage you to google it and see if it is something that bothers you enough to have it fixed.  If you do , we are happy to help!!  I am calling you in a medication called Jublia to a pharmacy that will be able to get you the best price on this particular medication-- it is called [facility].  They will call to confirm that you are interested in the topical therapy-- [place].  If you do not hear from them within 2-3 days, please let our office know.  You apply the topical to unpolished nails daily.

## 2014-06-04 NOTE — Addendum Note (Signed)
Addended by: Lottie RaterPREVETTE, ASHLEY E on: 06/04/2014 09:09 AM   Modules accepted: Orders

## 2014-07-08 ENCOUNTER — Encounter: Payer: Self-pay | Admitting: Podiatrist

## 2014-08-12 ENCOUNTER — Encounter (HOSPITAL_COMMUNITY): Payer: BC Managed Care – PPO

## 2014-08-21 ENCOUNTER — Other Ambulatory Visit (HOSPITAL_COMMUNITY)
Admission: RE | Admit: 2014-08-21 | Discharge: 2014-08-21 | Disposition: A | Discharge status: Home / Self Care | Payer: BC Managed Care – PPO | Source: Ambulatory Visit | Attending: Family | Admitting: Family

## 2014-08-21 ENCOUNTER — Ambulatory Visit (INDEPENDENT_AMBULATORY_CARE_PROVIDER_SITE_OTHER): Payer: BC Managed Care – PPO | Admitting: Family

## 2014-08-21 ENCOUNTER — Encounter: Payer: Self-pay | Admitting: Family

## 2014-08-21 VITALS — BP 120/86 | HR 74 | Temp 97.8°F | Resp 16 | Ht 67.0 in | Wt 314.6 lb

## 2014-08-21 DIAGNOSIS — N760 Acute vaginitis: Secondary | ICD-10-CM | POA: Insufficient documentation

## 2014-08-21 DIAGNOSIS — I10 Essential (primary) hypertension: Secondary | ICD-10-CM

## 2014-08-21 DIAGNOSIS — R0789 Other chest pain: Secondary | ICD-10-CM

## 2014-08-21 NOTE — Assessment & Plan Note (Signed)
Suspect BV.  Wet prep performed, await results.

## 2014-08-21 NOTE — Assessment & Plan Note (Signed)
Stable on lotrel. Advised pt to continue OCP as long as she is taking lotrel as lisinopril is not a safe medication for pregnancy. She verbalizes understanding.

## 2014-08-21 NOTE — Progress Notes (Signed)
Subjective:    Patient ID: Shelia Mcgee, female    DOB: 03/13/1976, 38 y.o.   MRN: [id number]  HPI  Ms. Shelia Mcgee is a 38 yr old female who presents today for follow up.  1) HTN- Patient is currently maintained on the following medications for blood pressure: lotrel Patient reports good compliance with blood pressure medications. Patient denies shortness of breath or swelling. Last 3 blood pressure readings in our office are as follows:  BP Readings from Last 3 Encounters:  08/21/14 120/86  06/03/14 119/79  05/22/14 134/87   2) Atypical chest pain-  She is scheduled for a nuclear stress on 12/28. Reports that she had 2 episodes of chest pain at rest.  Lasted about 20-30 minutes, resolved with aspirin.    3) Yeast infections- has one now, used monistat, resolving. Taking probiotic.  Had on 1 month ago.  Reports that it always starts around the time of her period. She reports    Review of Systems    see HPI  Past Medical History  Diagnosis Date  . Hypertension     History   Social History  . Marital Status: Single    Spouse Name: N/A    Number of Children: N/A  . Years of Education: N/A   Occupational History  . Not on file.   Social History Main Topics  . Smoking status: Never Smoker   . Smokeless tobacco: Never Used  . Alcohol Use: No  . Drug Use: No  . Sexual Activity: No   Other Topics Concern  . 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 Hip surgery Right 1992    States that bones were slipping. Stabilized it with 3 screws x 1 yr then had screws removed.    Family History  Problem Relation Age of Onset  . Hypertension Mother   . Hypertension Father   . Diabetes Father   . Heart attack Maternal Uncle     No Known  Allergies  Current Outpatient Prescriptions on File Prior to Visit  Medication Sig Dispense Refill  . amLODipine-benazepril (LOTREL) 5-40 MG per capsule Take 1 capsule by mouth daily.    . Efinaconazole 10 % SOLN Apply 1 drop topically daily. 8 mL 2  . levonorgestrel-ethinyl estradiol (AVIANE,ALESSE,LESSINA) 0.1-20 MG-MCG tablet Take 1 tablet by mouth daily.    . Multiple Vitamins-Minerals (MULTIVITAMIN WITH MINERALS) tablet Take 1 tablet by mouth daily.     No current facility-administered medications on file prior to visit.    BP 120/86 mmHg  Pulse 74  Temp(Src) 97.8 F (36.6 C) (Oral)  Resp 16  Ht 5\' 7"  (1.702 m)  Wt 314 lb 9.6 oz (142.702 kg)  BMI 49.26 kg/m2  SpO2 98%  LMP 08/12/2014    Objective:   Physical Exam  Constitutional: She is oriented to person, place, and time. She appears well-developed and well-nourished. No distress.  Cardiovascular: Normal rate and regular rhythm.   No murmur heard. Pulmonary/Chest: Effort normal and breath sounds normal. No respiratory distress. She has no wheezes. She has no rales. She exhibits no tenderness.  Genitourinary:  Small amount of white discharge noted at introitus.  Musculoskeletal: She exhibits no edema.  Neurological: [name] and oriented to person, place, and time.  Psychiatric: She has a normal mood and affect. Her behavior is normal. Judgment and thought content normal.          Assessment & Plan:

## 2014-08-21 NOTE — Patient Instructions (Signed)
We will contact you with your results. Follow up in 3 months. Happy Holidays!

## 2014-08-21 NOTE — Progress Notes (Signed)
Pre visit review using our clinic review tool, if applicable. No additional management support is needed unless otherwise documented below in the visit note. 

## 2014-08-21 NOTE — Addendum Note (Signed)
Addended by: Mervin KungFERGERSON, Helma Argyle A on: 08/21/2014 09:17 AM   Modules accepted: Orders

## 2014-08-21 NOTE — Assessment & Plan Note (Signed)
Pt is advised to keep upcoming appointment for stress test. Go to ED if she develops chest pain that does not resolve on its own.

## 2014-08-24 ENCOUNTER — Telehealth: Payer: Self-pay | Admitting: Family

## 2014-08-24 LAB — CERVICOVAGINAL ANCILLARY ONLY
WET PREP (BD AFFIRM): NEGATIVE
Wet Prep (BD Affirm): NEGATIVE
Wet Prep (BD Affirm): POSITIVE — AB

## 2014-08-24 MED ORDER — FLUCONAZOLE 150 MG PO TABS: ORAL_TABLET | ORAL | Status: DC

## 2014-08-24 NOTE — Telephone Encounter (Signed)
Wet prep + for yeast. Rx with diflucan.

## 2014-08-24 NOTE — Telephone Encounter (Signed)
Left message to return my call.  

## 2014-08-24 NOTE — Telephone Encounter (Signed)
Notified pt and she voices understanding. 

## 2014-08-31 ENCOUNTER — Telehealth (HOSPITAL_COMMUNITY): Payer: Self-pay | Admitting: *Deleted

## 2014-08-31 ENCOUNTER — Ambulatory Visit (HOSPITAL_COMMUNITY): Payer: BC Managed Care – PPO

## 2014-08-31 DIAGNOSIS — R9439 Abnormal result of other cardiovascular function study: Secondary | ICD-10-CM

## 2014-09-01 ENCOUNTER — Ambulatory Visit (HOSPITAL_COMMUNITY): Payer: BC Managed Care – PPO | Attending: Cardiovascular Disease | Admitting: Radiology

## 2014-09-01 ENCOUNTER — Ambulatory Visit (HOSPITAL_COMMUNITY): Payer: BC Managed Care – PPO

## 2014-09-01 DIAGNOSIS — R0789 Other chest pain: Secondary | ICD-10-CM | POA: Diagnosis not present

## 2014-09-01 MED ORDER — TECHNETIUM TC 99M SESTAMIBI GENERIC - CARDIOLITE
30.0000 | Freq: Once | INTRAVENOUS | Status: AC | PRN
  Administered 2014-09-01: 30 via INTRAVENOUS

## 2014-09-01 NOTE — Progress Notes (Signed)
[facility] SITE 3 NUCLEAR MED [address] [place], [place] [address] [phone]    Cardiology Nuclear Med Study  Dalbert GarnetShanca Mcgee is a 38 y.o. female     MRN : [id number]     DOB: 03/17/1976  Procedure Date: 09/01/2014  Nuclear Med Background Indication for Stress Test:  Evaluation for Ischemia History:  No known CAD Cardiac Risk Factors: Hypertension  Symptoms:  Chest Tightness    Nuclear Pre-Procedure Caffeine/Decaff Intake:  None> 12 hrs NPO After: 11:00am   Lungs:  clear O2 Sat: 99% on room air. IV 0.9% NS with Angio Cath:  22g  IV Site: L Forearm, tolerated well  IV Started by:  Doyne Keelonya Yount, CNMT  Chest Size (in):  48 Cup Size: DDD  Height: 5\' 7"  (1.702 m)  Weight:  315 lb (142.883 kg)  BMI:  Body mass index is 49.32 kg/(m^2). Tech Comments:  N/A    Nuclear Med Study 1 or 2 day study: 2 day  Stress Test Type:  Stress  Reading MD: N/A  Order Authorizing Provider:  Danise EdgeStacey Blyth, MD, and Sandford CrazeMelissa O'Sullivan, NP  Resting Radionuclide: Technetium 774m Sestamibi  Resting Radionuclide Dose: 33.0 mCi on 09/03/14   Stress Radionuclide:  Technetium 7074m Sestamibi  Stress Radionuclide Dose: 33.0 mCi on 09/01/14           Stress Protocol Rest HR: 71 Stress HR: 169  Rest BP: 128/75 Stress BP: 206/98  Exercise Time (min): 6:00 METS: 7.2   Predicted Max HR: 184 bpm % Max HR: 91.85 bpm Rate Pressure Product: [id number]   Dose of Adenosine (mg):  n/a Dose of Lexiscan: n/a mg  Dose of Atropine (mg): n/a Dose of Dobutamine: n/a mcg/kg/min (at max HR)  Stress Test Technologist: Nelson ChimesSharon Brooks, BS-ES  Nuclear Technologist:  Doyne Keelonya Yount, CNMT     Rest Procedure:  Myocardial perfusion imaging was performed at rest 45 minutes following the intravenous administration of Technetium 6774m Sestamibi. Rest ECG: NSR - Normal EKG  Stress Procedure:  The patient exercised on the treadmill utilizing the Bruce Protocol for 6:00 minutes. The patient stopped due to increased BP  And 4/10  chest discomfort.  BP extremely elevated, however had patient straighten arm and place on techs shoulder and diastolic BP was lower.  Technetium 5574m Sestamibi was injected at peak exercise and myocardial perfusion imaging was performed after a brief delay.  Stress ECG: No significant change from baseline ECG  QPS Raw Data Images:  There is a significant breast shadow that accounts for the anterior attenuation and significant diaphragmatic attenuation. Stress Images:  There is decreased uptake in the mid and apical anterior wall and mid and basal inferolateral walls. Rest Images:  There is decreased uptake in the apical anterior wall and mid and basal inferolateral walls. Subtraction (SDS):  There is a medium sized, moderate intensity defect involving the mid and apical anterior wall and mid and basal inferolateral walls.  There is some mild reversibility in the mid anterior and mid and basal inferolateral walls that could represent a small area of ischemia but also could be due to shifting breast and diaphragmatic attenuation.  There was signifiant breast attenuation on RAW images. Transient Ischemic Dilatation (Normal <1.22):  0.82 Lung/Heart Ratio (Normal <0.45):  0.27  Quantitative Gated Spect Images QGS EDV:  110 ml QGS ESV:  44 ml  Impression Exercise Capacity:  Fair exercise capacity. BP Response:  Hypertensive blood pressure response. Clinical Symptoms:  Typical chest pain. ECG Impression:  No significant  ST segment change suggestive of ischemia. Comparison with Prior Nuclear Study: No images to compare  Overall Impression:  Intermediate risk stress nuclear study: There is a medium sized, moderate intensity defect involving the mid and apical anterior wall and mid and basal inferolateral walls.  There is some mild reversibility in the mid anterior and mid and basal inferolateral walls that could represent a small area of ischemia but also could be due to shifting breast and  diaphragmatic attenuation.  There was signifiant breast attenuation on RAW images..  LV Ejection Fraction: 60%.  LV Wall Motion:  NL LV Function; NL Wall Motion  Signed: Armanda Magicraci Surah Pelley, MD [facility] 09/03/2014

## 2014-09-03 ENCOUNTER — Ambulatory Visit (HOSPITAL_COMMUNITY): Payer: BC Managed Care – PPO | Attending: Cardiovascular Disease

## 2014-09-03 DIAGNOSIS — R0989 Other specified symptoms and signs involving the circulatory and respiratory systems: Secondary | ICD-10-CM

## 2014-09-03 MED ORDER — TECHNETIUM TC 99M SESTAMIBI GENERIC - CARDIOLITE
33.0000 | Freq: Once | INTRAVENOUS | Status: AC | PRN
Start: 2014-09-03 — End: 2014-09-03
  Administered 2014-09-03: 33 via INTRAVENOUS

## 2014-09-05 NOTE — Telephone Encounter (Signed)
Please see mychart message below and confirm that pt has read- if not please contact pt.

## 2014-09-07 ENCOUNTER — Telehealth: Payer: Self-pay | Admitting: Family

## 2014-09-07 NOTE — Telephone Encounter (Signed)
Caller name: Laritza Relation to pt: self Call back number: [phone] ext [phone] Pharmacy:  Reason for call:   Patient is requesting additional info regarding stress results. She states that she did not understand mychart message.

## 2014-09-07 NOTE — Telephone Encounter (Signed)
Pt returned my call.  Questions answered, she is agreeable to proceed with cardiology referral.

## 2014-09-07 NOTE — Telephone Encounter (Signed)
Left message on cell requesting call back.  Tried work number no answer.  When pt calls back please let her know that the stress test was abnormal, could be shadowing from breast tissue or could be due to heart problem.  I would like her to meet with cardiology. They can further evaluate her and decide if she needs any further testing to evaluate.

## 2014-09-07 NOTE — Telephone Encounter (Signed)
Chart review indicates pt read mychart message on 09/07/14 at 8:49am.

## 2014-10-07 ENCOUNTER — Telehealth: Payer: Self-pay | Admitting: Family

## 2014-10-07 MED ORDER — AMLODIPINE BESY-BENAZEPRIL HCL 5-40 MG PO CAPS: 1.0000 | ORAL_CAPSULE | Freq: Every day | ORAL | Status: DC

## 2014-10-07 NOTE — Telephone Encounter (Signed)
30 day supply sent to pharmacy, pt notified.

## 2014-10-07 NOTE — Telephone Encounter (Signed)
Caller name: Linnell FullingShanca Relation to pt: self  Call back number: [phone] ext [phone] Pharmacy:  Reason for call:   Patient is requesting a 30 day supply [facility] [address]. She is out and needs and emergency supply until her mail order arrives.

## 2014-10-21 MED ORDER — AMLODIPINE BESY-BENAZEPRIL HCL 5-40 MG PO CAPS: 1.0000 | ORAL_CAPSULE | Freq: Every day | ORAL | Status: DC

## 2014-10-21 NOTE — Addendum Note (Signed)
Addended by: Mervin KungFERGERSON, Zykiria Bruening A on: 10/21/2014 02:47 PM   Modules accepted: Orders

## 2014-10-21 NOTE — Telephone Encounter (Signed)
90 day supply sent to mail order. Pt notified.

## 2014-10-21 NOTE — Telephone Encounter (Addendum)
Caller name: Dalbert GarnetCooper, Amberlin Relation to pt: self  Call back number: [phone] Pharm[facility]: [facility] [place] [address] [phone] (Phone) [phone] (Fax)         Reason for call:  Pt requesting amLODipine-benazepril (LOTREL) 5-40 MG per capsule please send to  [facility] [place] [address] [phone] (Phone) [phone] (Fax)

## 2014-10-26 NOTE — Telephone Encounter (Signed)
Received fax request from [facility] for Lotrel 10/40mg . Per pt's chart, pt is taking 5/40mg  once a day. Rx was previously sent to [facility] on 10/21/14, #90 a 1 refill. Faxed response to [facility] to see Rx from 10/21/14 and note difference of strength.

## 2014-10-27 ENCOUNTER — Encounter: Payer: Self-pay | Admitting: Family

## 2014-10-27 ENCOUNTER — Telehealth: Payer: Self-pay | Admitting: Family

## 2014-10-27 DIAGNOSIS — R9439 Abnormal result of other cardiovascular function study: Secondary | ICD-10-CM

## 2014-10-27 NOTE — Telephone Encounter (Signed)
Referral was placed on 1/2.  Could you please check status of apt?  I placed another referral today.

## 2014-10-27 NOTE — Telephone Encounter (Signed)
Caller name: Shelia Mcgee, Shelia Mcgee Relation to pt: self  Call back number: [phone] work #    Reason for call:   Pt in need of clarification regarding dosage   amLODipine-benazepril (LOTREL) 5-40 MG per capsule

## 2014-10-27 NOTE — Telephone Encounter (Signed)
Original order was not sent to our work due due to ordering provider placing [facility]. New order is ordered correctly will call and get appointment scheduled.

## 2014-10-27 NOTE — Telephone Encounter (Signed)
Pt states she received a notification from [facility] regarding a prescription for both Lotrel 5-40 mg and Lotrel 10-40 mg, and she wanted to know which she should take.  Pt was encouraged to take the 5-40 mg as this was the most recent prescription.  Pt stated understanding and agreed to comply.    During the call, the patient also had a question regarding whether or not stress test results were sent to a cardiologist.  A cardiology referral was noted in chart.  However, pt states she has not heard from anyone regarding this referral yet.  Please advise.

## 2014-11-20 ENCOUNTER — Encounter: Payer: Self-pay | Admitting: Family

## 2014-11-20 ENCOUNTER — Ambulatory Visit (INDEPENDENT_AMBULATORY_CARE_PROVIDER_SITE_OTHER): Payer: BLUE CROSS/BLUE SHIELD | Admitting: Family

## 2014-11-20 VITALS — BP 148/98 | HR 74 | Temp 98.0°F | Resp 16 | Ht 67.0 in | Wt 304.0 lb

## 2014-11-20 DIAGNOSIS — I10 Essential (primary) hypertension: Secondary | ICD-10-CM

## 2014-11-20 LAB — BASIC METABOLIC PANEL
BUN: 15 mg/dL (ref 6–23)
CO2: 27 mEq/L (ref 19–32)
CREATININE: 1.02 mg/dL (ref 0.40–1.20)
Calcium: 9 mg/dL (ref 8.4–10.5)
Chloride: 106 mEq/L (ref 96–112)
GFR: 78.63 mL/min (ref >60.00)
Glucose, Bld: 83 mg/dL (ref 70–99)
Potassium: 3.8 mEq/L (ref 3.5–5.1)
SODIUM: 137 meq/L (ref 135–145)

## 2014-11-20 MED ORDER — AMLODIPINE BESYLATE 10 MG PO TABS: 10.0000 mg | ORAL_TABLET | Freq: Every day | ORAL | Status: DC

## 2014-11-20 NOTE — Patient Instructions (Addendum)
Stop lotrel, start amlodipine 10mg .  Follow up in 1 month for complete physical.

## 2014-11-20 NOTE — Progress Notes (Signed)
Pre visit review using our clinic review tool, if applicable. No additional management support is needed unless otherwise documented below in the visit note. 

## 2014-11-20 NOTE — Progress Notes (Signed)
   Subjective:    Patient ID: Shelia Mcgee, female    DOB: 09/22/1975, 39 y.o.   MRN: [id number]  HPI  Shelia Mcgee presents today for 3 month follow up.  Patient is currently maintained on the following medications for blood pressure: lotrel Patient reports good compliance with blood pressure medications. Patient denies chest pain (since her last visit- stress test was negative), shortness of breath or swelling. Last 3 blood pressure readings in our office are as follows: BP Readings from Last 3 Encounters:  11/20/14 148/98  09/01/14 128/75  08/21/14 120/86  Home readings are as follows:  120-130/80's   Review of Systems    see HPI  Past Medical History  Diagnosis Date  . Hypertension     History   Social History  . Marital Status: Single    Spouse Name: N/A  . Number of Children: N/A  . Years of Education: N/A   Occupational History  . Not on file.   Social History Main Topics  . Smoking status: Never Smoker   . Smokeless tobacco: Never Used  . Alcohol Use: No  . Drug Use: No  . Sexual Activity: No   Other Topics Concern  . 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 Hip surgery Right 1992    States that bones were slipping. Stabilized it with 3 screws x 1 yr then had screws removed.    Family History  Problem Relation Age of Onset  . Hypertension Mother   . Hypertension Father   . Diabetes Father   . Heart attack Maternal Uncle     No Known Allergies  Current Outpatient Prescriptions on File Prior to Visit  Medication Sig Dispense Refill  . Efinaconazole 10 % SOLN Apply 1 drop topically daily. 8 mL 2  . fluconazole (DIFLUCAN) 150 MG tablet One tab by mouth today, may repeat in 3 days if symptoms do not improve. 2 tablet 0  .  Multiple Vitamins-Minerals (MULTIVITAMIN WITH MINERALS) tablet Take 1 tablet by mouth daily.    . nabumetone (RELAFEN) 750 MG tablet Take 750 mg by mouth 2 (two) times daily as needed.     No current facility-administered medications on file prior to visit.    BP 148/98 mmHg  Pulse 74  Temp(Src) 98 F (36.7 C) (Oral)  Resp 16  Ht 5\' 7"  (1.702 m)  Wt 304 lb (137.893 kg)  BMI 47.60 kg/m2  SpO2 98%  LMP 11/19/2014    Objective:   Physical Exam  Constitutional: She appears well-developed and well-nourished.  Cardiovascular: Normal rate, regular rhythm and normal heart sounds.   No murmur heard. Pulmonary/Chest: Effort normal and breath sounds normal. No respiratory distress. She has no wheezes.  Musculoskeletal: She exhibits no edema.  Psychiatric: She has a normal mood and affect. Her behavior is normal. Judgment and thought content normal.          Assessment & Plan:

## 2014-11-21 NOTE — Assessment & Plan Note (Signed)
BP is elevated in office today, but has been better at home and on prior visits.  Advised pt to continue to monitor her BP at home and let us know if BP remains elevated.  Continue current meds. Obtain bmet.

## 2014-11-22 NOTE — Progress Notes (Signed)
Patient ID: Shelia GarnetShanca Mcgee, female   DOB: 04/10/1976, 39 y.o.   MRN: [id number]     Cardiology Office Note   Date:  11/24/2014   ID:  Shelia Mcgee, DOB 09/03/1976, MRN [id number]  PCP:  Lemont Fillers'SULLIVAN,MELISSA S., NP  Cardiologist:   Charlton HawsPeter Ellie Spickler, MD   Chief Complaint  Patient presents with  . NEW PATIENT      History of Present Illness: Shelia GarnetShanca Baltzell is a 39 y.o. female who presents for evaluation of abnormal stress test  During office visit 08/21/14 Reports that she had 2 episodes of chest pain at rest. Lasted about 20-30 minutes, resolved with aspirin.  She has CRF;s HTN on Rx  Seen by Dr Peggyann Juba'Sullivan in January with chest pain and myovue ordered.  Reviewed images  Overall Impression: Intermediate risk stress nuclear study: There is a medium sized, moderate intensity defect involving the mid and apical anterior wall and mid and basal inferolateral walls. There is some mild reversibility in the mid anterior and mid and basal inferolateral walls that could represent a small area of ischemia but also could be due to shifting breast and diaphragmatic attenuation. There was signifiant breast attenuation on RAW images..  LV Ejection Fraction: 60%. LV Wall Motion: NL LV Function; NL Wall Motion   Pains have lessened with weight loss  BP elevated but just taken off ARB due to stopping birth control pills.  Pain in chest seemed more musculoskeletal   Past Medical History  Diagnosis Date  . Hypertension     Past Surgical History  Procedure Laterality Date  . Hip surgery Right 1992    States that bones were slipping. Stabilized it with 3 screws x 1 yr then had screws removed.     Current Outpatient Prescriptions  Medication Sig Dispense Refill  . amLODipine (NORVASC) 10 MG tablet Take 1 tablet (10 mg total) by mouth daily. 30 tablet 2  . Multiple Vitamins-Minerals (MULTIVITAMIN WITH MINERALS) tablet Take 1 tablet by mouth daily.    . nabumetone (RELAFEN) 750 MG tablet Take 750 mg by  mouth 2 (two) times daily as needed.    . Efinaconazole 10 % SOLN Apply 1 drop topically daily. (Patient not taking: Reported on 11/24/2014) 8 mL 2  . fluconazole (DIFLUCAN) 150 MG tablet One tab by mouth today, may repeat in 3 days if symptoms do not improve. (Patient not taking: Reported on 11/24/2014) 2 tablet 0   No current facility-administered medications for this visit.    Allergies:   Review of patient's allergies indicates no known allergies.    Social History:  The patient  reports that she has never smoked. She has never used smokeless tobacco. She reports that she does not drink alcohol or use illicit drugs.   Family History:  The patient's family history includes Diabetes in her father; Heart attack in her maternal uncle; Hypertension in her father and mother.    ROS:  Please see the history of present illness.   Otherwise, review of systems are positive for none.   All other systems are reviewed and negative.    PHYSICAL EXAM: VS:  BP 168/91 mmHg  Pulse 74  Ht 5\' 7"  (1.702 m)  Wt 308 lb (139.708 kg)  BMI 48.23 kg/m2  LMP 11/19/2014 , BMI Body mass index is 48.23 kg/(m^2). Obese black female  HEENT: normal Neck: no JVD, carotid bruits, or masses Cardiac:  RRR; no murmurs, rubs, or gallops,no edema  Respiratory:  clear to auscultation bilaterally, normal work of breathing GI:  soft, nontender, nondistended, + BS MS: no deformity or atrophy Skin: warm and dry, no rash Neuro:  Strength and sensation are intact Psych: euthymic mood, full affect   EKG:   05/22/14  SR rate 84  Normal ECG    Recent Labs: 05/22/2014: ALT 17; Hemoglobin 12.6; Platelets 358.0; TSH 0.35 11/20/2014: BUN 15; Creatinine 1.02; Potassium 3.8; Sodium 137    Lipid Panel    Component Value Date/Time   CHOL 161 05/22/2014 0805   TRIG 132.0 05/22/2014 0805   HDL 43.70 05/22/2014 0805   CHOLHDL 4 05/22/2014 0805   VLDL 26.4 05/22/2014 0805   LDLCALC 91 05/22/2014 0805      Wt Readings from  Last 3 Encounters:  11/24/14 308 lb (139.708 kg)  11/20/14 304 lb (137.893 kg)  09/01/14 315 lb (142.883 kg)      Other studies Reviewed: Additional studies/ records that were reviewed today include: Myovue and Epic records SEE HPI.    ASSESSMENT AND PLAN:  1.  Chest Pain:  Discussed scan result with patient Likely breast and diaphragm attenuation. Given body habitus there is no good non invasive imaging modality For her.  Discussed diagnostic cath and risk of dye reaction, stroke and bleeding  She prefers to wait and observe and see if things continue to improve with  Weight loss  2.  HTN  Told her to take amlodipine in am  F/u [place] in 4 weeks will likely need addition of diuretic    Current medicines are reviewed at length with the patient today.  The patient does not have concerns regarding medicines.  The following changes have been made:  Nitro called in   Labs/ tests ordered today include: None  No orders of the defined types were placed in this encounter.     Disposition:   FU with 3 months     Signed, Charlton Haws, MD  11/24/2014 11:15 AM    [facility] [address] [place], [place], [place]  [address] Phone: [phone]; Fax: [phone]

## 2014-11-24 ENCOUNTER — Encounter: Payer: Self-pay | Admitting: Cardiovascular Disease

## 2014-11-24 ENCOUNTER — Encounter: Payer: Self-pay | Admitting: *Deleted

## 2014-11-24 ENCOUNTER — Ambulatory Visit (INDEPENDENT_AMBULATORY_CARE_PROVIDER_SITE_OTHER): Payer: BLUE CROSS/BLUE SHIELD | Admitting: Cardiovascular Disease

## 2014-11-24 VITALS — BP 168/91 | HR 74 | Ht 67.0 in | Wt 308.0 lb

## 2014-11-24 DIAGNOSIS — R0789 Other chest pain: Secondary | ICD-10-CM

## 2014-11-24 MED ORDER — NITROGLYCERIN 0.4 MG SL SUBL: 0.4000 mg | SUBLINGUAL_TABLET | SUBLINGUAL | Status: DC | PRN

## 2014-11-24 NOTE — Patient Instructions (Addendum)
Your physician recommends that you schedule a follow-up appointment in: 3 MONTHS [facility]    Your physician recommends that you continue on your current medications as directed. Please refer to the Current Medication list given to you today.  Nitroglycerin sublingual tablets What is this medicine? NITROGLYCERIN (nye troe GLI ser in) is a type of vasodilator. It relaxes blood vessels, increasing the blood and oxygen supply to your heart. This medicine is used to relieve chest pain caused by angina. It is also used to prevent chest pain before activities like climbing stairs, going outdoors in cold weather, or sexual activity. This medicine may be used for other purposes; ask your health care provider or pharmacist if you have questions. COMMON BRAND NAME(S): Nitroquick, Nitrostat, Nitrotab What should I tell my health care provider before I take this medicine? They need to know if you have any of these conditions: -anemia -head injury, recent stroke, or bleeding in the brain -liver disease -previous heart attack -an unusual or allergic reaction to nitroglycerin, other medicines, foods, dyes, or preservatives -pregnant or trying to get pregnant -breast-feeding How should I use this medicine? Take this medicine by mouth as needed. At the first sign of an angina attack (chest pain or tightness) place one tablet under your tongue. You can also take this medicine 5 to 10 minutes before an event likely to produce chest pain. Follow the directions on the prescription label. Let the tablet dissolve under the tongue. Do not swallow whole. Replace the dose if you accidentally swallow it. It will help if your mouth is not dry. Saliva around the tablet will help it to dissolve more quickly. Do not eat or drink, smoke or chew tobacco while a tablet is dissolving. If you are not better within 5 minutes after taking ONE dose of nitroglycerin, call 9-1-1 immediately to seek emergency medical care. Do not  take more than 3 nitroglycerin tablets over 15 minutes. If you take this medicine often to relieve symptoms of angina, your doctor or health care professional may provide you with different instructions to manage your symptoms. If symptoms do not go away after following these instructions, it is important to call 9-1-1 immediately. Do not take more than 3 nitroglycerin tablets over 15 minutes. Talk to your pediatrician regarding the use of this medicine in children. Special care may be needed. Overdosage: If you think you have taken too much of this medicine contact a poison control center or emergency room at once. NOTE: This medicine is only for you. Do not share this medicine with others. What if I miss a dose? This does not apply. This medicine is only used as needed. What may interact with this medicine? Do not take this medicine with any of the following medications: -certain migraine medicines like ergotamine and dihydroergotamine (DHE) -medicines used to treat erectile dysfunction like sildenafil, tadalafil, and vardenafil -riociguat This medicine may also interact with the following medications: -alteplase -aspirin -heparin -medicines for high blood pressure -medicines for mental depression -other medicines used to treat angina -phenothiazines like chlorpromazine, mesoridazine, prochlorperazine, thioridazine This list may not describe all possible interactions. Give your health care provider a list of all the medicines, herbs, non-prescription drugs, or dietary supplements you use. Also tell them if you smoke, drink alcohol, or use illegal drugs. Some items may interact with your medicine. What should I watch for while using this medicine? Tell your doctor or health care professional if you feel your medicine is no longer working. Keep this  medicine with you at all times. Sit or lie down when you take your medicine to prevent falling if you feel dizzy or faint after using it. Try to  remain calm. This will help you to feel better faster. If you feel dizzy, take several deep breaths and lie down with your feet propped up, or bend forward with your head resting between your knees. You may get drowsy or dizzy. Do not drive, use machinery, or do anything that needs mental alertness until you know how this drug affects you. Do not stand or sit up quickly, especially if you are an older patient. This reduces the risk of dizzy or fainting spells. Alcohol can make you more drowsy and dizzy. Avoid alcoholic drinks. Do not treat yourself for coughs, colds, or pain while you are taking this medicine without asking your doctor or health care professional for advice. Some ingredients may increase your blood pressure. What side effects may I notice from receiving this medicine? Side effects that you should report to your doctor or health care professional as soon as possible: -blurred vision -dry mouth -skin rash -sweating -the feeling of extreme pressure in the head -unusually weak or tired Side effects that usually do not require medical attention (report to your doctor or health care professional if they continue or are bothersome): -flushing of the face or neck -headache -irregular heartbeat, palpitations -nausea, vomiting This list may not describe all possible side effects. Call your doctor for medical advice about side effects. You may report side effects to FDA at 1-800-FDA-1088. Where should I keep my medicine? Keep out of the reach of children. Store at room temperature between 20 and 25 degrees C (68 and 77 degrees F). Store in Retail buyer. Protect from light and moisture. Keep tightly closed. Throw away any unused medicine after the expiration date. NOTE: This sheet is a summary. It may not cover all possible information. If you have questions about this medicine, talk to your doctor, pharmacist, or health care provider.  2015, Elsevier/Gold Standard. (2013-06-19  17:57:36)

## 2014-12-25 ENCOUNTER — Encounter: Payer: BLUE CROSS/BLUE SHIELD | Admitting: Family

## 2014-12-25 ENCOUNTER — Ambulatory Visit (INDEPENDENT_AMBULATORY_CARE_PROVIDER_SITE_OTHER): Payer: BLUE CROSS/BLUE SHIELD | Admitting: Family

## 2014-12-25 ENCOUNTER — Encounter: Payer: Self-pay | Admitting: Family

## 2014-12-25 VITALS — BP 100/80 | HR 84 | Temp 98.2°F | Resp 16 | Ht 67.0 in | Wt 307.0 lb

## 2014-12-25 DIAGNOSIS — I10 Essential (primary) hypertension: Secondary | ICD-10-CM | POA: Diagnosis not present

## 2014-12-25 MED ORDER — AMLODIPINE BESYLATE 10 MG PO TABS: 10.0000 mg | ORAL_TABLET | Freq: Every day | ORAL | Status: DC

## 2014-12-25 NOTE — Progress Notes (Signed)
Pre visit review using our clinic review tool, if applicable. No additional management support is needed unless otherwise documented below in the visit note. 

## 2014-12-25 NOTE — Patient Instructions (Signed)
Follow up in 4 months 

## 2014-12-25 NOTE — Assessment & Plan Note (Addendum)
BP is stable based on today's reading (my manual repeat was 120/85) and based on home readings. Continue current meds.

## 2014-12-25 NOTE — Progress Notes (Signed)
   Subjective:    Patient ID: Shelia Mcgee, female    DOB: 02/21/1976, 39 y.o.   MRN: [id number]  HPI  Shelia Mcgee is a 39 yr old female who presents today for follow up.  Patient is currently maintained on the following medications for blood pressure: amlodipine Patient reports good compliance with blood pressure medications. Patient denies chest pain, shortness of breath or swelling. Last 3 blood pressure readings in our office are as follows: Home blood pressures averaging 120/85.      Review of Systems See HPI  Past Medical History  Diagnosis Date  . Hypertension     History   Social History  . Marital Status: Single    Spouse Name: N/A  . Number of Children: N/A  . Years of Education: N/A   Occupational History  . Not on file.   Social History Main Topics  . Smoking status: Never Smoker   . Smokeless tobacco: Never Used  . Alcohol Use: No  . Drug Use: No  . Sexual Activity: No   Other Topics Concern  . 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 Hip surgery Right 1992    States that bones were slipping. Stabilized it with 3 screws x 1 yr then had screws removed.    Family History  Problem Relation Age of Onset  . Hypertension Mother   . Hypertension Father   . Diabetes Father   . Heart attack Maternal Uncle     No Known Allergies  Current Outpatient Prescriptions on File Prior to Visit  Medication Sig Dispense Refill  . amLODipine (NORVASC) 10 MG tablet Take 1 tablet (10 mg total) by mouth daily. 30 tablet 2  . Efinaconazole 10 % SOLN Apply 1 drop topically daily. 8 mL 2  . fluconazole (DIFLUCAN) 150 MG tablet One tab by mouth today, may repeat in 3 days if symptoms do not improve. 2 tablet 0  . Multiple Vitamins-Minerals  (MULTIVITAMIN WITH MINERALS) tablet Take 1 tablet by mouth daily.    . nabumetone (RELAFEN) 750 MG tablet Take 750 mg by mouth 2 (two) times daily as needed.    . nitroGLYCERIN (NITROSTAT) 0.4 MG SL tablet Place 1 tablet (0.4 mg total) under the tongue every 5 (five) minutes as needed for chest pain. 25 tablet 3   No current facility-administered medications on file prior to visit.    BP 100/80 mmHg  Pulse 84  Temp(Src) 98.2 F (36.8 C)  Resp 16  Ht 5\' 7"  (1.702 m)  Wt 307 lb (139.254 kg)  BMI 48.07 kg/m2  SpO2 99%  LMP 12/21/2014       Objective:   Physical Exam  Constitutional: She appears well-developed and well-nourished.  Cardiovascular: Normal rate, regular rhythm and normal heart sounds.   No murmur heard. Pulmonary/Chest: Effort normal and breath sounds normal. No respiratory distress. She has no wheezes.  Psychiatric: She has a normal mood and affect. Her behavior is normal. Judgment and thought content normal.          Assessment & Plan:

## 2015-01-12 ENCOUNTER — Encounter: Payer: Self-pay | Admitting: Family

## 2015-01-18 ENCOUNTER — Ambulatory Visit: Payer: Self-pay | Admitting: Family

## 2015-01-22 ENCOUNTER — Ambulatory Visit (INDEPENDENT_AMBULATORY_CARE_PROVIDER_SITE_OTHER): Payer: BLUE CROSS/BLUE SHIELD | Admitting: Family

## 2015-01-22 ENCOUNTER — Encounter: Payer: Self-pay | Admitting: Family

## 2015-01-22 VITALS — BP 128/82 | HR 62 | Temp 97.8°F | Resp 16 | Ht 67.0 in | Wt 304.0 lb

## 2015-01-22 DIAGNOSIS — Z309 Encounter for contraceptive management, unspecified: Secondary | ICD-10-CM | POA: Insufficient documentation

## 2015-01-22 DIAGNOSIS — Z30011 Encounter for initial prescription of contraceptive pills: Secondary | ICD-10-CM

## 2015-01-22 LAB — POCT URINE HCG BY VISUAL COLOR COMPARISON TESTS: Preg Test, Ur: NEGATIVE

## 2015-01-22 MED ORDER — MICROGESTIN FE 1.5/30 1.5-30 MG-MCG PO TABS: 1.0000 | ORAL_TABLET | Freq: Every day | ORAL | Status: DC

## 2015-01-22 NOTE — Progress Notes (Signed)
Pre visit review using our clinic review tool, if applicable. No additional management support is needed unless otherwise documented below in the visit note. 

## 2015-01-22 NOTE — Patient Instructions (Signed)
Please follow up in the end of September for annual physical with Pap. Start birth control, continue regular condom use.

## 2015-01-22 NOTE — Addendum Note (Signed)
Addended by: Mervin KungFERGERSON, Riyanna Crutchley A on: 01/22/2015 01:51 PM   Modules accepted: Orders

## 2015-01-22 NOTE — Assessment & Plan Note (Signed)
Discussed continuing regular condom use. Obtain urine Hcg, if negative, begin microgestin.  Plan routine pap at next physical in September.

## 2015-01-22 NOTE — Progress Notes (Signed)
   Subjective:    Patient ID: Shelia Mcgee, female    DOB: 11/21/1975, 39 y.o.   MRN: [id number]  HPI  Shelia Mcgee is a 39 yr old female who presents today to discuss contraception.   Has been on orsythia in the past, but reports that certain generics caused her yeast infections. Reports+ condom use Reports that her periods are regular.  Medium flow, not much cramping G0P0 LMP was 5/11 Last pap was 2014  Review of Systems See HPI  Past Medical History  Diagnosis Date  . Hypertension     History   Social History  . Marital Status: Single    Spouse Name: N/A  . Number of Children: N/A  . Years of Education: N/A   Occupational History  . Not on file.   Social History Main Topics  . Smoking status: Never Smoker   . Smokeless tobacco: Never Used  . Alcohol Use: No  . Drug Use: No  . Sexual Activity: No   Other Topics Concern  . 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 Hip surgery Right 1992    States that bones were slipping. Stabilized it with 3 screws x 1 yr then had screws removed.    Family History  Problem Relation Age of Onset  . Hypertension Mother   . Hypertension Father   . Diabetes Father   . Heart attack Maternal Uncle     No Known Allergies  Current Outpatient Prescriptions on File Prior to Visit  Medication Sig Dispense Refill  . amLODipine (NORVASC) 10 MG tablet Take 1 tablet (10 mg total) by mouth daily. 90 tablet 1  . Efinaconazole 10 % SOLN Apply 1 drop topically daily. 8 mL 2  . Multiple Vitamins-Minerals (MULTIVITAMIN WITH MINERALS) tablet Take 1 tablet by mouth daily.    . nabumetone (RELAFEN) 750 MG tablet Take 750 mg by mouth 2 (two) times daily as needed.    . nitroGLYCERIN (NITROSTAT) 0.4 MG SL tablet Place 1 tablet  (0.4 mg total) under the tongue every 5 (five) minutes as needed for chest pain. 25 tablet 3   No current facility-administered medications on file prior to visit.    BP 128/82 mmHg  Pulse 62  Temp(Src) 97.8 F (36.6 C) (Oral)  Resp 16  Ht 5\' 7"  (1.702 m)  Wt 304 lb (137.893 kg)  BMI 47.60 kg/m2  SpO2 98%  LMP 01/19/2015       Objective:   Physical Exam  Constitutional: She is oriented to person, place, and time. She appears well-developed and well-nourished. No distress.  Neurological: [name] and oriented to person, place, and time.  Psychiatric: She has a normal mood and affect. Her behavior is normal. Judgment and thought content normal.          Assessment & Plan:

## 2015-02-03 NOTE — Progress Notes (Signed)
Patient ID: Shelia GarnetShanca Ravi, female   DOB: 06/24/1976, 39 y.o.   MRN: [id number]     Cardiology Office Note   Date:  02/03/2015   ID:  Shelia Mcgee, DOB 11/29/1975, MRN [id number]  PCP:  Lemont Fillers'SULLIVAN,MELISSA S., NP  Cardiologist:   Charlton HawsPeter Jennings Corado, MD   No chief complaint on file.     History of Present Illness: Shelia Mcgee is a 39 y.o. female who presents for evaluation of abnormal stress test  During office visit 08/21/14 Reports that she had 2 episodes of chest pain at rest. Lasted about 20-30 minutes, resolved with aspirin.  She has CRF;s HTN on Rx  Seen by Dr Peggyann Juba'Sullivan in January with chest pain and myovue ordered.  Reviewed images  Overall Impression: Intermediate risk stress nuclear study: There is a medium sized, moderate intensity defect involving the mid and apical anterior wall and mid and basal inferolateral walls. There is some mild reversibility in the mid anterior and mid and basal inferolateral walls that could represent a small area of ischemia but also could be due to shifting breast and diaphragmatic attenuation. There was signifiant breast attenuation on RAW images..  LV Ejection Fraction: 60%. LV Wall Motion: NL LV Function; NL Wall Motion   Pains have lessened with weight loss  BP elevated but just taken off ARB due to stopping birth control pills.  Pain in chest seemed more musculoskeletal  No further issues with chest pain Home BP readings normal.  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 Hypertension     Past Surgical History  Procedure Laterality Date  . Hip surgery Right 1992    States that bones were slipping. Stabilized it with 3 screws x 1 yr then had screws removed.     Current Outpatient Prescriptions  Medication Sig Dispense Refill  . amLODipine (NORVASC) 10 MG tablet Take 1 tablet (10 mg total) by mouth daily. 90 tablet 1  . Efinaconazole 10 % SOLN Apply 1 drop topically  daily. 8 mL 2  . MICROGESTIN FE 1.5/30 1.5-30 MG-MCG tablet Take 1 tablet by mouth daily. 1 Package 11  . Multiple Vitamins-Minerals (MULTIVITAMIN WITH MINERALS) tablet Take 1 tablet by mouth daily.    . nabumetone (RELAFEN) 750 MG tablet Take 750 mg by mouth 2 (two) times daily as needed.    . nitroGLYCERIN (NITROSTAT) 0.4 MG SL tablet Place 1 tablet (0.4 mg total) under the tongue every 5 (five) minutes as needed for chest pain. 25 tablet 3   No current facility-administered medications for this visit.    Allergies:   Review of patient's allergies indicates no known allergies.    Social History:  The patient  reports that she has never smoked. She has never used smokeless tobacco. She reports that she does not drink alcohol or use illicit drugs.   Family History:  The patient's family history includes Diabetes in her father; Heart attack in her maternal uncle; Hypertension in her father and mother.    ROS:  Please see the history of present illness.   Otherwise, review of systems are positive for none.   All other systems are reviewed and negative.    PHYSICAL EXAM: VS:  LMP 01/19/2015 , BMI There is no weight on file to calculate BMI. Obese black female  HEENT: normal Neck: no JVD, carotid bruits, or masses Cardiac:  RRR; no murmurs, rubs, or gallops,no edema  Respiratory:  clear  to auscultation bilaterally, normal work of breathing GI: soft, nontender, nondistended, + BS MS: no deformity or atrophy Skin: warm and dry, no rash Neuro:  Strength and sensation are intact Psych: euthymic mood, full affect   EKG:   05/22/14  SR rate 84  Normal ECG    Recent Labs: 05/22/2014: ALT 17; Hemoglobin 12.6; Platelets 358.0; TSH 0.35 11/20/2014: BUN 15; Creatinine 1.02; Potassium 3.8; Sodium 137    Lipid Panel    Component Value Date/Time   CHOL 161 05/22/2014 0805   TRIG 132.0 05/22/2014 0805   HDL 43.70 05/22/2014 0805   CHOLHDL 4 05/22/2014 0805   VLDL 26.4 05/22/2014 0805    LDLCALC 91 05/22/2014 0805      Wt Readings from Last 3 Encounters:  01/22/15 137.893 kg (304 lb)  12/25/14 139.254 kg (307 lb)  11/24/14 139.708 kg (308 lb)      Other studies Reviewed: Additional studies/ records that were reviewed today include: Myovue and Epic records SEE HPI.    ASSESSMENT AND PLAN:  1.  Chest Pain:  Resolved abnormal myovue likely breast artifact continue obsrevation  2.  HTN  Home readings are fine discussed lifestyle changes no need for diuretic at this time    Current medicines are reviewed at length with the patient today.  The patient does not have concerns regarding medicines.  The following changes have been made:   None   Labs/ tests ordered today include: None  No orders of the defined types were placed in this encounter.     Disposition:   FU in a year     Signed, Charlton Haws, MD  02/03/2015 11:05 AM    [facility] [address] [place], [place], [place]  [address] Phone: [phone]; Fax: [phone]

## 2015-02-08 ENCOUNTER — Ambulatory Visit (INDEPENDENT_AMBULATORY_CARE_PROVIDER_SITE_OTHER): Payer: BLUE CROSS/BLUE SHIELD | Admitting: Cardiovascular Disease

## 2015-02-08 ENCOUNTER — Encounter: Payer: Self-pay | Admitting: Cardiovascular Disease

## 2015-02-08 VITALS — BP 132/90 | HR 72 | Ht 67.0 in | Wt 306.8 lb

## 2015-02-08 DIAGNOSIS — I10 Essential (primary) hypertension: Secondary | ICD-10-CM

## 2015-02-08 NOTE — Patient Instructions (Signed)
Your physician wants you to follow-up in: YEAR WITH DR NISHAN  You will receive a reminder letter in the mail two months in advance. If you don't receive a letter, please call our office to schedule the follow-up appointment.  Your physician recommends that you continue on your current medications as directed. Please refer to the Current Medication list given to you today. 

## 2015-02-09 ENCOUNTER — Telehealth: Payer: Self-pay | Admitting: Cardiovascular Disease

## 2015-02-09 NOTE — Telephone Encounter (Signed)
Called patient and verified fax number. Will send work note to fax.

## 2015-02-09 NOTE — Telephone Encounter (Signed)
New message      Pt was seen yesterday.  Need note for work saying she was seen yesterday.  Please fax to [phone]----fax is at her desk

## 2015-02-12 ENCOUNTER — Encounter: Payer: Self-pay | Admitting: Family

## 2015-03-02 ENCOUNTER — Encounter: Payer: Self-pay | Admitting: Family

## 2015-03-05 ENCOUNTER — Encounter: Payer: Self-pay | Admitting: Family

## 2015-03-09 ENCOUNTER — Encounter: Payer: Self-pay | Admitting: Family

## 2015-03-09 ENCOUNTER — Ambulatory Visit (INDEPENDENT_AMBULATORY_CARE_PROVIDER_SITE_OTHER): Payer: BLUE CROSS/BLUE SHIELD | Admitting: Family

## 2015-03-09 VITALS — BP 134/86 | HR 66 | Temp 97.5°F | Resp 16 | Ht 67.0 in | Wt 306.0 lb

## 2015-03-09 DIAGNOSIS — J309 Allergic rhinitis, unspecified: Secondary | ICD-10-CM

## 2015-03-09 MED ORDER — FLUTICASONE PROPIONATE 50 MCG/ACT NA SUSP: 2.0000 | Freq: Every day | NASAL | Status: DC

## 2015-03-09 MED ORDER — CETIRIZINE HCL 10 MG PO TABS: 10.0000 mg | ORAL_TABLET | Freq: Every day | ORAL | Status: DC

## 2015-03-09 NOTE — Progress Notes (Signed)
Pre visit review using our clinic review tool, if applicable. No additional management support is needed unless otherwise documented below in the visit note. 

## 2015-03-09 NOTE — Progress Notes (Signed)
Subjective:    Patient ID: Shelia GarnetShanca Mcgee, female    DOB: 11/28/1975, 39 y.o.   MRN: [id number]  HPI  Shelia Mcgee is a 39 yr old female who presents today with chief complaint of nasal congestion. Nasal congestion has been present x 3 weeks.  She has not used afrin x 3 days. She reports that she feels that she "cannot breath through her nostrils"  She has not tried flonase, but she has tried claritin and zyrtec without improvement in her symptoms.   She reports some clear nasal discharge. Has some sinus pressure.  Denies fever.    Review of Systems    see HPI  Past Medical History  Diagnosis Date  . Hypertension     History   Social History  . Marital Status: Single    Spouse Name: N/A  . Number of Children: N/A  . Years of Education: N/A   Occupational History  . Not on file.   Social History Main Topics  . Smoking status: Never Smoker   . Smokeless tobacco: Never Used  . Alcohol Use: No  . Drug Use: No  . Sexual Activity: No   Other Topics Concern  . 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 Hip surgery Right 1992    States that bones were slipping. Stabilized it with 3 screws x 1 yr then had screws removed.    Family History  Problem Relation Age of Onset  . Hypertension Mother   . Hypertension Father   . Diabetes Father   . Heart attack Maternal Uncle     No Known Allergies  Current Outpatient Prescriptions on File Prior to Visit  Medication Sig Dispense Refill  . amLODipine (NORVASC) 10 MG tablet Take 1 tablet (10 mg total) by mouth daily. 90 tablet 1  . MICROGESTIN FE 1.5/30 1.5-30 MG-MCG tablet Take 1 tablet by mouth daily. 1 Package 11  . Multiple Vitamins-Minerals (MULTIVITAMIN WITH MINERALS) tablet Take 1 tablet by mouth daily.      . nabumetone (RELAFEN) 750 MG tablet Take 750 mg by mouth 2 (two) times daily as needed.    . nitroGLYCERIN (NITROSTAT) 0.4 MG SL tablet Place 1 tablet (0.4 mg total) under the tongue every 5 (five) minutes as needed for chest pain. 25 tablet 3   No current facility-administered medications on file prior to visit.    BP 134/86 mmHg  Pulse 66  Temp(Src) 97.5 F (36.4 C) (Oral)  Resp 16  Ht 5\' 7"  (1.702 m)  Wt 306 lb (138.801 kg)  BMI 47.92 kg/m2  SpO2 99%  LMP 03/06/2015    Objective:   Physical Exam  Constitutional: She is oriented to person, place, and time. She appears well-developed and well-nourished.  HENT:  Head: Normocephalic and atraumatic.  Right Ear: Tympanic membrane and ear canal normal.  Left Ear: Tympanic membrane and ear canal normal.  Nose: No mucosal edema or septal deviation.  Mouth/Throat: No oropharyngeal exudate, posterior oropharyngeal edema or posterior oropharyngeal erythema.  Cardiovascular: Normal rate, regular rhythm and normal heart sounds.   No murmur heard. Pulmonary/Chest: Effort normal and breath sounds normal. No respiratory distress. She has no wheezes.  Musculoskeletal: She exhibits no edema.  Lymphadenopathy:    She has no cervical  adenopathy.  Neurological: [name] and oriented to person, place, and time.  Skin: Skin is warm and dry.  Psychiatric: She has a normal mood and affect. Her behavior is normal. Judgment and thought content normal.          Assessment & Plan:

## 2015-03-09 NOTE — Patient Instructions (Signed)
Start flonase 2 sprays each nostril once daily. Start zyrtec. Call if symptoms worsen or if not improved in 2 weeks.

## 2015-03-10 DIAGNOSIS — J309 Allergic rhinitis, unspecified: Secondary | ICD-10-CM | POA: Insufficient documentation

## 2015-03-10 HISTORY — DX: Allergic rhinitis, unspecified: J30.9

## 2015-03-10 NOTE — Assessment & Plan Note (Signed)
Trial of zyrtec and flonase. Follow up if symptoms worsen or if symptoms do not improve.

## 2015-05-04 ENCOUNTER — Encounter: Payer: Self-pay | Admitting: Family

## 2015-05-04 NOTE — Telephone Encounter (Signed)
Spoke with pt. She states that she has been feeling sleepy after taking amlodipine. Had episode this morning about 2 hours after taking medication of "feeling an air bubble move across her chest from the left to the right, had slight tightness in chest and slight tingling of left arm". Pt denies any shortness of breath and states she is having no symptoms at present.  Per verbal from PCP, advised pt if symptoms return she should go to the ER for evaluation and we will see pt in the office tomorrow at 1:30pm.  Pt states she doesn't think she can get off work for appt tomorrow. Advised her she can go to urgent care at [facility] in [place] this evening if she is unable to get off work tomorrow. Again advised pt to go to ER if symptoms return. Pt voices understanding.

## 2015-05-04 NOTE — Telephone Encounter (Signed)
Left message for pt to return my call.

## 2015-05-05 ENCOUNTER — Ambulatory Visit (INDEPENDENT_AMBULATORY_CARE_PROVIDER_SITE_OTHER): Payer: BLUE CROSS/BLUE SHIELD | Admitting: Family

## 2015-05-05 ENCOUNTER — Encounter: Payer: Self-pay | Admitting: Family

## 2015-05-05 VITALS — BP 126/90 | HR 76 | Temp 98.0°F | Resp 16 | Ht 67.0 in | Wt 310.6 lb

## 2015-05-05 DIAGNOSIS — R079 Chest pain, unspecified: Secondary | ICD-10-CM | POA: Diagnosis not present

## 2015-05-05 DIAGNOSIS — R0789 Other chest pain: Secondary | ICD-10-CM | POA: Diagnosis not present

## 2015-05-05 MED ORDER — AMLODIPINE BESYLATE 10 MG PO TABS: 10.0000 mg | ORAL_TABLET | Freq: Every day | ORAL | Status: DC

## 2015-05-05 NOTE — Progress Notes (Signed)
Pre visit review using our clinic review tool, if applicable. No additional management support is needed unless otherwise documented below in the visit note. 

## 2015-05-05 NOTE — Patient Instructions (Signed)
Please go to the ER if you develop recurrent chest pain symptoms.  Restart amlodipine.

## 2015-05-05 NOTE — Assessment & Plan Note (Signed)
Reviewed cardiology note from March. The patient declined cardiac cath.  It was felt that her abnormal stress test was most likely due to breast attenuation, however, she declined cardiac cath which would be diagnostic.  We discussed getting her back in to see Dr. Eden Emms to re-discuss cardiac cath, but she declines at this time. She is agreeable to return to the ER if she develops recurrent chest pain however.

## 2015-05-05 NOTE — Progress Notes (Signed)
Subjective:    Patient ID: Shelia Mcgee, female    DOB: [birth year], 39 y.o.   MRN: [id number]  HPI   [name] is a 39 yr old female who presents today with chief complaint of chest pain.   She reports that she first noticed symptoms on Tuesday she took her meds at [facility] and it caused her extreme drowsiness.  She then noted some chest pressure or a "bubble" in the middle of her chest which moved to the left side of her chest.  She reports that the numbness in her left arm which occurred in the AM but resolved when she went outside. Returned later when she went back into the [facility] at her part time job.  She denies current numbness or chest pain.  She reports that she ran out of amlodipine for a period of time and amlodipine was restarted until 2-3 weeks ago. She denies SOB, swelling or calf pain.   She did have a stress test 09/02/15. Intermediate risk stress nuclear study: There is a medium sized, moderate intensity defect involving the mid and apical anterior wall and mid and basal inferolateral walls.     Review of Systems See HPI     Past Medical History  Diagnosis Date  . Hypertension     Social History   Social History  . Marital Status: Single    Spouse Name: N/A  . Number of Children: N/A  . Years of Education: N/A   Occupational History  . Not on file.   Social History Main Topics  . Smoking status: Never Smoker   . Smokeless tobacco: Never Used  . Alcohol Use: No  . Drug Use: No  . Sexual Activity: No   Other Topics Concern  . 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 Hip surgery Right 1992    States that bones were slipping. Stabilized it with 3 screws x 1 yr then had screws removed.    Family History  Problem Relation Age of Onset  .  Hypertension Mother   . Hypertension Father   . Diabetes Father   . Heart attack Maternal Uncle     No Known Allergies  Current Outpatient Prescriptions on File Prior to Visit  Medication Sig Dispense Refill  . amLODipine (NORVASC) 10 MG tablet Take 1 tablet (10 mg total) by mouth daily. 90 tablet 1  . cetirizine (ZYRTEC) 10 MG tablet Take 1 tablet (10 mg total) by mouth daily. 30 tablet 11  . fluticasone (FLONASE) 50 MCG/ACT nasal spray Place 2 sprays into both nostrils daily. 16 g 3  . MICROGESTIN FE 1.5/30 1.5-30 MG-MCG tablet Take 1 tablet by mouth daily. 1 Package 11  . Multiple Vitamins-Minerals (MULTIVITAMIN WITH MINERALS) tablet Take 1 tablet by mouth daily.    . nabumetone (RELAFEN) 750 MG tablet Take 750 mg by mouth 2 (two) times daily as needed.    . nitroGLYCERIN (NITROSTAT) 0.4 MG SL tablet Place 1 tablet (0.4 mg total) under the tongue every 5 (five) minutes as needed for chest pain. 25 tablet 3   No current facility-administered medications on file prior to visit.    BP 126/90 mmHg  Pulse 76  Temp(Src) 98 F (36.7 C) (Oral)  Resp 16  Ht  (1.702 m)  Wt 310 lb 9.6 oz (140.887 kg)  BMI 48.64 kg/m2  SpO2 100%  LMP 05/01/2015    Objective:   Physical Exam  Constitutional: She appears well-developed and well-nourished.  Cardiovascular: Normal rate, regular rhythm and normal heart sounds.   No murmur heard. Pulmonary/Chest: Effort normal and breath sounds normal. No respiratory distress. She has no wheezes.  Musculoskeletal: She exhibits no edema.  Psychiatric: She has a normal mood and affect. Her behavior is normal. Judgment and thought content normal.          Assessment & Plan:         Assessment & Plan:  EKG tracing is personally reviewed.  EKG notes NSR.  No acute changes.

## 2015-05-11 ENCOUNTER — Telehealth: Payer: Self-pay | Admitting: *Deleted

## 2015-05-11 MED ORDER — MICROGESTIN FE 1.5/30 1.5-30 MG-MCG PO TABS: 1.0000 | ORAL_TABLET | Freq: Every day | ORAL | Status: DC

## 2015-05-11 MED ORDER — AMLODIPINE BESYLATE 10 MG PO TABS: 10.0000 mg | ORAL_TABLET | Freq: Every day | ORAL | Status: DC

## 2015-05-11 NOTE — Telephone Encounter (Signed)
Received fax requests from [facility] for amlodipine and noreth/E ES FE. Verified with pt that she will need to start using [facility]. Rxs sent.

## 2015-05-12 ENCOUNTER — Ambulatory Visit: Payer: Self-pay | Admitting: Family

## 2015-05-24 ENCOUNTER — Telehealth: Payer: Self-pay | Admitting: *Deleted

## 2015-05-24 NOTE — Telephone Encounter (Signed)
Unable to reach patient at time of Pre-Visit Call.  Left message for patient to return call when available.    

## 2015-05-25 ENCOUNTER — Encounter: Payer: Self-pay | Admitting: Behavioral Health

## 2015-05-25 ENCOUNTER — Telehealth: Payer: Self-pay | Admitting: Behavioral Health

## 2015-05-25 NOTE — Telephone Encounter (Signed)
Pre-Visit Call completed with patient and chart updated.   Pre-Visit Info documented in Specialty Comments under Snap[employer]ot.    

## 2015-05-26 ENCOUNTER — Encounter: Payer: Self-pay | Admitting: Family

## 2015-05-26 ENCOUNTER — Ambulatory Visit (INDEPENDENT_AMBULATORY_CARE_PROVIDER_SITE_OTHER): Payer: BLUE CROSS/BLUE SHIELD | Admitting: Family

## 2015-05-26 ENCOUNTER — Other Ambulatory Visit (HOSPITAL_COMMUNITY)
Admission: RE | Admit: 2015-05-26 | Discharge: 2015-05-26 | Disposition: A | Discharge status: Home / Self Care | Payer: BLUE CROSS/BLUE SHIELD | Source: Ambulatory Visit | Attending: Family | Admitting: Family

## 2015-05-26 VITALS — BP 130/98 | HR 83 | Temp 98.2°F | Resp 16 | Ht 67.0 in | Wt 315.8 lb

## 2015-05-26 DIAGNOSIS — Z113 Encounter for screening for infections with a predominantly sexual mode of transmission: Secondary | ICD-10-CM | POA: Insufficient documentation

## 2015-05-26 DIAGNOSIS — N760 Acute vaginitis: Secondary | ICD-10-CM | POA: Insufficient documentation

## 2015-05-26 DIAGNOSIS — Z01419 Encounter for gynecological examination (general) (routine) without abnormal findings: Secondary | ICD-10-CM | POA: Diagnosis not present

## 2015-05-26 DIAGNOSIS — Z1151 Encounter for screening for human papillomavirus (HPV): Secondary | ICD-10-CM | POA: Diagnosis not present

## 2015-05-26 DIAGNOSIS — Z0000 Encounter for general adult medical examination without abnormal findings: Secondary | ICD-10-CM | POA: Diagnosis not present

## 2015-05-26 LAB — CBC WITH DIFFERENTIAL/PLATELET
Basophils Absolute: 0 10*3/uL (ref 0.0–0.1)
Basophils Relative: 0.6 % (ref 0.0–3.0)
Eosinophils Absolute: 0 10*3/uL (ref 0.0–0.7)
Eosinophils Relative: 0.5 % (ref 0.0–5.0)
HEMATOCRIT: 39.8 % (ref 36.0–46.0)
Hemoglobin: 12.9 g/dL (ref 12.0–15.0)
LYMPHS PCT: 35.5 % (ref 12.0–46.0)
Lymphs Abs: 2.7 10*3/uL (ref 0.7–4.0)
MCHC: 32.4 g/dL (ref 30.0–36.0)
MCV: 82.5 fl (ref 78.0–100.0)
MONOS PCT: 6.8 % (ref 3.0–12.0)
Monocytes Absolute: 0.5 10*3/uL (ref 0.1–1.0)
Neutro Abs: 4.3 10*3/uL (ref 1.4–7.7)
Neutrophils Relative %: 56.6 % (ref 43.0–77.0)
Platelets: 404 10*3/uL — ABNORMAL HIGH (ref 150.0–400.0)
RBC: 4.83 Mil/uL (ref 3.87–5.11)
RDW: 15.9 % — ABNORMAL HIGH (ref 11.5–15.5)
WBC: 7.6 10*3/uL (ref 4.0–10.5)

## 2015-05-26 LAB — URINALYSIS, ROUTINE W REFLEX MICROSCOPIC
Bilirubin Urine: NEGATIVE
HGB URINE DIPSTICK: NEGATIVE
Ketones, ur: NEGATIVE
Leukocytes, UA: NEGATIVE
NITRITE: NEGATIVE
RBC / HPF: NONE SEEN (ref 0–?)
Specific Gravity, Urine: 1.01 (ref 1.000–1.030)
TOTAL PROTEIN, URINE-UPE24: NEGATIVE
Urine Glucose: NEGATIVE
Urobilinogen, UA: 0.2 (ref 0.0–1.0)
WBC, UA: NONE SEEN (ref 0–?)
pH: 6.5 (ref 5.0–8.0)

## 2015-05-26 LAB — BASIC METABOLIC PANEL
BUN: 14 mg/dL (ref 6–23)
CALCIUM: 8.9 mg/dL (ref 8.4–10.5)
CO2: 29 meq/L (ref 19–32)
Chloride: 102 mEq/L (ref 96–112)
Creatinine, Ser: 0.99 mg/dL (ref 0.40–1.20)
GFR: 81.15 mL/min (ref >60.00)
Glucose, Bld: 79 mg/dL (ref 70–99)
Potassium: 3.6 mEq/L (ref 3.5–5.1)
SODIUM: 137 meq/L (ref 135–145)

## 2015-05-26 LAB — HEPATIC FUNCTION PANEL
ALBUMIN: 3.8 g/dL (ref 3.5–5.2)
ALK PHOS: 34 U/L — AB (ref 39–117)
ALT: 13 U/L (ref 0–35)
AST: 19 U/L (ref 0–37)
Bilirubin, Direct: 0.1 mg/dL (ref 0.0–0.3)
TOTAL PROTEIN: 7.3 g/dL (ref 6.0–8.3)
Total Bilirubin: 0.6 mg/dL (ref 0.2–1.2)

## 2015-05-26 LAB — LIPID PANEL
CHOLESTEROL: 155 mg/dL (ref 0–200)
HDL: 47.8 mg/dL (ref >39.00)
LDL Cholesterol: 86 mg/dL (ref 0–99)
NonHDL: 107.06
TRIGLYCERIDES: 107 mg/dL (ref 0.0–149.0)
Total CHOL/HDL Ratio: 3
VLDL: 21.4 mg/dL (ref 0.0–40.0)

## 2015-05-26 LAB — TSH: TSH: 1.94 u[IU]/mL (ref 0.35–4.50)

## 2015-05-26 NOTE — Patient Instructions (Signed)
Please complete lab work prior to leaving. Work on healthy diet, exercise, weight loss.   

## 2015-05-26 NOTE — Assessment & Plan Note (Signed)
Discussed healthy diet, exercise, weight loss.  She will get flu shot from employer. Tetanus up to date. Pap performed today.  She requests STD screening. Obtain fasting labs.

## 2015-05-26 NOTE — Progress Notes (Signed)
Subjective:    Patient ID: Shelia Mcgee, female    DOB: [birth year], 39 y.o.   MRN: [id number]  HPI  Shelia Mcgee is a 39 yr old female who presents today for cpx.    Immunizations:  Tetanus up to date Diet: not healthy Exercise: no Pap Smear: 8/14 Dental:  Due  Wt Readings from Last 3 Encounters:  05/26/15 315 lb 12.8 oz (143.246 kg)  05/05/15 310 lb 9.6 oz (140.887 kg)  03/09/15 306 lb (138.801 kg)   BP Readings from Last 3 Encounters:  05/26/15 130/98  05/05/15 126/90  03/09/15 134/86      Review of Systems  Constitutional: Negative for unexpected weight change.  HENT: Negative for hearing loss and rhinorrhea.   Eyes: Negative for visual disturbance.  Respiratory: Negative for cough.   Cardiovascular: Negative for leg swelling.  Gastrointestinal: Negative for diarrhea and constipation.  Genitourinary: Negative for dysuria, frequency and menstrual problem.  Musculoskeletal: Negative for myalgias and arthralgias.  Skin: Negative for rash.  Neurological: Negative for headaches.  Hematological: Negative for adenopathy.  Psychiatric/Behavioral:       Denies depression/anxiety       Past Medical History  Diagnosis Date  . Hypertension     Social History   Social History  . Marital Status: Single    Spouse Name: N/A  . Number of Children: N/A  . Years of Education: N/A   Occupational History  . Not on file.   Social History Main Topics  . Smoking status: Never Smoker   . Smokeless tobacco: Never Used  . Alcohol Use: No  . Drug Use: No  . Sexual Activity: No   Other Topics Concern  . 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 Hip surgery Right 1992    States that bones were slipping. Stabilized it with 3 screws x 1 yr then had screws  removed.    Family History  Problem Relation Age of Onset  . Hypertension Mother   . Hypertension Father   . Diabetes Father   . Heart attack Maternal Uncle     No Known Allergies  Current Outpatient Prescriptions on File Prior to Visit  Medication Sig Dispense Refill  . amLODipine (NORVASC) 10 MG tablet Take 1 tablet (10 mg total) by mouth daily. 90 tablet 1  . cetirizine (ZYRTEC) 10 MG tablet Take 1 tablet (10 mg total) by mouth daily. 30 tablet 11  . fluticasone (FLONASE) 50 MCG/ACT nasal spray Place 2 sprays into both nostrils daily. 16 g 3  . MICROGESTIN FE 1.5/30 1.5-30 MG-MCG tablet Take 1 tablet by mouth daily. 90 tablet 1  . Multiple Vitamins-Minerals (MULTIVITAMIN WITH MINERALS) tablet Take 1 tablet by mouth daily.    . nitroGLYCERIN (NITROSTAT) 0.4 MG SL tablet Place 1 tablet (0.4 mg total) under the tongue every 5 (five) minutes as needed for chest pain. 25 tablet 3   No current facility-administered medications on file prior to visit.    BP 130/98 mmHg  Pulse 83  Temp(Src) 98.2 F (36.8 C) (Oral)  Resp 16  Ht  (1.702 m)  Wt 315 lb 12.8 oz (143.246 kg)  BMI 49.45 kg/m2  SpO2 98%  LMP 05/01/2015    Objective:   Physical Exam  Physical Exam  Constitutional: She is oriented to person, place, and time. She appears well-developed and well-nourished. No distress.  HENT:  Head: Normocephalic and atraumatic.  Right Ear: Tympanic membrane and ear canal normal.  Left Ear: Tympanic membrane and ear canal normal.  Mouth/Throat: Oropharynx is clear and moist.  Eyes: Pupils are equal, round, and reactive to light. No scleral icterus.  Neck: Normal range of motion. No thyromegaly present.  Cardiovascular: Normal rate and regular rhythm.   No murmur heard. Pulmonary/Chest: Effort normal and breath sounds normal. No respiratory distress. He has no wheezes. She has no rales. She exhibits no tenderness.  Abdominal: Soft. Bowel sounds are normal. He exhibits no  distension and no mass. There is no tenderness. There is no rebound and no guarding.  Musculoskeletal: She exhibits no edema.  Lymphadenopathy:    She has no cervical adenopathy.  Neurological: [name] and oriented to person, place, and time.  She exhibits normal muscle tone. Coordination normal.  Skin: Skin is warm and dry.  Psychiatric: She has a normal mood and affect. Her behavior is normal. Judgment and thought content normal.  Breasts: Examined lying Right: Without masses, retractions, discharge or axillary adenopathy.  Left: Without masses, retractions, discharge or axillary adenopathy.  Inguinal/mons: Normal without inguinal adenopathy  External genitalia: Normal  BUS/Urethra/Skene's glands: Normal  Bladder: Normal  Vagina: Normal  Cervix: Normal - small nabothian cysts noted.  Uterus: normal in size, shape and contour. Midline and mobile  Adnexa/parametria:  Rt: Without masses or tenderness.  Lt: Without masses or tenderness.  Anus and perineum: Normal           Assessment & Plan:    DBP up today, she states she has not taken her AM meds yet.        Assessment & Plan:

## 2015-05-26 NOTE — Progress Notes (Signed)
Pre visit review using our clinic review tool, if applicable. No additional management support is needed unless otherwise documented below in the visit note. 

## 2015-05-27 LAB — CYTOLOGY - PAP

## 2015-05-27 LAB — RPR

## 2015-05-27 LAB — HIV ANTIBODY (ROUTINE TESTING W REFLEX): HIV: NONREACTIVE

## 2015-05-28 ENCOUNTER — Other Ambulatory Visit (INDEPENDENT_AMBULATORY_CARE_PROVIDER_SITE_OTHER): Payer: BLUE CROSS/BLUE SHIELD

## 2015-05-28 ENCOUNTER — Encounter: Payer: Self-pay | Admitting: Family

## 2015-05-28 DIAGNOSIS — R748 Abnormal levels of other serum enzymes: Secondary | ICD-10-CM

## 2015-05-28 LAB — VITAMIN D 25 HYDROXY (VIT D DEFICIENCY, FRACTURES): VITD: 26.98 ng/mL — AB (ref 30.00–100.00)

## 2015-05-31 ENCOUNTER — Other Ambulatory Visit: Payer: Self-pay | Admitting: Family

## 2015-05-31 DIAGNOSIS — E559 Vitamin D deficiency, unspecified: Secondary | ICD-10-CM

## 2015-05-31 HISTORY — DX: Vitamin D deficiency, unspecified: E55.9

## 2015-05-31 NOTE — Telephone Encounter (Signed)
Vitamin D level is low.  Advise patient to begin vit D 50000 units PO once weekly for 12 weeks, then repeat vit D level (dx Vit D deficiency).     

## 2015-06-01 MED ORDER — VITAMIN D (ERGOCALCIFEROL) 1.25 MG (50000 UNIT) PO CAPS: 50000.0000 [IU] | ORAL_CAPSULE | ORAL | Status: DC

## 2015-06-01 NOTE — Telephone Encounter (Signed)
Pt returning call. Please call at work # [phone] x [phone].

## 2015-06-01 NOTE — Telephone Encounter (Signed)
Left message for pt to return my call.

## 2015-06-01 NOTE — Telephone Encounter (Signed)
Attempted to reach pt and left message for pt to check mychart message.

## 2015-06-01 NOTE — Telephone Encounter (Signed)
Rx sent, see mychart message.

## 2015-08-16 ENCOUNTER — Other Ambulatory Visit (INDEPENDENT_AMBULATORY_CARE_PROVIDER_SITE_OTHER): Payer: BLUE CROSS/BLUE SHIELD

## 2015-08-16 DIAGNOSIS — E559 Vitamin D deficiency, unspecified: Secondary | ICD-10-CM | POA: Diagnosis not present

## 2015-08-16 LAB — VITAMIN D 25 HYDROXY (VIT D DEFICIENCY, FRACTURES): VITD: 33.29 ng/mL (ref 30.00–100.00)

## 2015-08-17 ENCOUNTER — Other Ambulatory Visit: Payer: Self-pay | Admitting: Family

## 2015-08-17 MED ORDER — VITAMIN D3 75 MCG (3000 UT) PO TABS: 1.0000 | ORAL_TABLET | Freq: Every day | ORAL | Status: AC

## 2015-08-28 ENCOUNTER — Emergency Department (INDEPENDENT_AMBULATORY_CARE_PROVIDER_SITE_OTHER)
Admission: EM | Admit: 2015-08-28 | Discharge: 2015-08-28 | Disposition: A | Discharge status: Home / Self Care | Payer: BLUE CROSS/BLUE SHIELD | Source: Home / Self Care | Attending: Emergency Medicine | Admitting: Emergency Medicine

## 2015-08-28 ENCOUNTER — Encounter (HOSPITAL_COMMUNITY): Payer: Self-pay

## 2015-08-28 DIAGNOSIS — J0100 Acute maxillary sinusitis, unspecified: Secondary | ICD-10-CM

## 2015-08-28 MED ORDER — HYDROCOD POLST-CPM POLST ER 10-8 MG/5ML PO SUER: 5.0000 mL | Freq: Two times a day (BID) | ORAL | Status: DC | PRN

## 2015-08-28 MED ORDER — IBUPROFEN 800 MG PO TABS: 800.0000 mg | ORAL_TABLET | Freq: Three times a day (TID) | ORAL | Status: DC | PRN

## 2015-08-28 MED ORDER — MOMETASONE FUROATE 50 MCG/ACT NA SUSP: 2.0000 | Freq: Every day | NASAL | Status: DC

## 2015-08-28 MED ORDER — AMOXICILLIN-POT CLAVULANATE 875-125 MG PO TABS
1.0000 | ORAL_TABLET | Freq: Two times a day (BID) | ORAL | Status: DC
Start: 2015-08-28 — End: 2015-11-08

## 2015-08-28 NOTE — ED Notes (Signed)
Pt has had sinus congestion,sneezing and headaches for 1 week. OTC meds are not helping Pt alert and oriented

## 2015-08-28 NOTE — ED Provider Notes (Signed)
SUBJECTIVE:  Shelia Mcgee is a 39 y.o. female who presents with nasal congestion, postnasal drip, ST, nonproductive cough, maxillary sinus pain/pressure, purulent nasal d/c for the past 2 weeks. Symptoms are better with Afrin, worse with change in temperature. She has been taking over-the-counter cold medications. No fevers, N/V, other HA, bodyaches, dental pain. States feels simlar to prev episodes of sinusitis. PMH  sinus infections, hypertension. No h/o DM, asthma, emphysema, COPD. Pt is not a smoker.   Past Medical History:   Hypertension                                                Past Surgical History:   HIP SURGERY                                     Right 1992           Comment:States that bones were slipping. Stabilized it               with 3 screws x 1 yr then had screws removed.  Review of patient's family history indicates:   Hypertension                   Mother                   Hypertension                   Father                   Diabetes                       Father                   Heart attack                   Maternal Uncle             Smoking Status: Never Smoker                     Smokeless Status: Never Used                       Alcohol Use: No             No current facility-administered medications for this encounter. Current outpatient prescriptions: .  amLODipine (NORVASC) 10 MG tablet, Take 1 tablet (10 mg total) by mouth daily., Disp: 90 tablet, Rfl: 1.  cetirizine (ZYRTEC) 10 MG tablet, Take 1 tablet (10 mg total) by mouth daily., Disp: 30 tablet, Rfl: 11.  Cholecalciferol (VITAMIN D3) 3000 UNITS TABS, Take 1 tablet by mouth daily., Disp: 30 tablet, Rfl: .  fluticasone (FLONASE) 50 MCG/ACT nasal spray, Place 2 sprays into both nostrils daily., Disp: 16 g, Rfl: 3.  MICROGESTIN FE 1.5/30 1.5-30 MG-MCG tablet, Take 1 tablet by mouth daily., Disp: 90 tablet, Rfl: 1.  Multiple Vitamins-Minerals (MULTIVITAMIN WITH MINERALS) tablet, Take 1 tablet by mouth  daily., Disp: , Rfl: .  nitroGLYCERIN (NITROSTAT) 0.4 MG SL tablet, Place 1 tablet (0.4 mg total) under the tongue every 5 (five) minutes as needed for chest pain., Disp: 25 tablet, Rfl: 3  No Known Allergies   ROS  As noted in HPI.   Physical Exam  BP 140/90 mmHg  Pulse 85  Temp(Src) 98.2 F (36.8 C) (Oral)  SpO2 99%  LMP 08/21/2015  Constitutional: Well developed, well nourished, no acute distress Eyes:  EOMI, conjunctiva normal bilaterally HENT: Normocephalic, atraumatic,mucus membranes moist. TMs normal bilaterally. + purulent nasal congestion. Swollen  red turbinates. + maxillary sinus tenderness. Oropharynx normal  - postnasal drip.  Respiratory: Normal inspiratory effort lungs clear bilaterally good air movement Cardiovascular: Normal rate and rhythm, no murmurs rubs gallops GI: nondistended skin: No rash, skin intact Musculoskeletal: no deformities Neurologic: Alert & oriented x 3, no focal neuro deficits Psychiatric: Speech and behavior appropriate   ED Course   Medications - No data to display  No orders of the defined types were placed in this encounter.    No results found for this or any previous visit (from the past 24 hour(s)). No results found.  ED Clinical Impression  Maxillary sinusitis  ED Assessment/Plan   Pt with indications for abx of induration the symptoms. Will start augmentin, in addition to nasal steriods, mucinex-d- instructed patient to to watch blood pressures while taking this will switch to Mucinex if it elevates her blood pressure significantly-  saline nasal irrigation, increase fluids, tylenol/motrin prn pain. Discussed MDM and plan with pt. Discussed sn/sx that should prompt return to the UC or ED. Pt agrees with plan and will f/u with PMD prn.    *This clinic note was created using Dragon dictation software. Therefore, there may be occasional mistakes despite careful proofreading.   Domenick GongAshley Nejla Reasor, MD 08/28/15 [phone]

## 2015-08-28 NOTE — Discharge Instructions (Signed)
Take the medication as written. Return if you get worse, have a fever >100.4, or for any concerns. You may take 800 mg of motrin with 1 gram of tylenol up to 3 times a day as needed for pain. This is an effective combination for pain.  Most sinus infections are viral and do not need antibiotics unless you have a high fever, have had this for 10 days, or you get better and then get sick again. Use a neti pot or the NeilMed sinus rinse as often as you want to to reduce nasal congestion. Follow the directions on the box.  ° °Go to www.goodrx.com to look up your medications. This will give you a list of where you can find your prescriptions at the most affordable prices.  ° °

## 2015-09-14 ENCOUNTER — Encounter: Payer: Self-pay | Admitting: Family

## 2015-10-18 ENCOUNTER — Other Ambulatory Visit: Payer: Self-pay | Admitting: Family

## 2015-10-18 NOTE — Telephone Encounter (Signed)
90 day supply of amlodipine and microgestin sent to mail order. Pt is due for 6 month follow up with Melissa in March.  Please call pt to schedule appt.  Thanks!

## 2015-10-20 NOTE — Telephone Encounter (Signed)
LVM informing pt of medication and to also schedule FU advised by PCP

## 2015-11-08 ENCOUNTER — Encounter: Payer: Self-pay | Admitting: Family

## 2015-11-08 ENCOUNTER — Ambulatory Visit (INDEPENDENT_AMBULATORY_CARE_PROVIDER_SITE_OTHER): Payer: BLUE CROSS/BLUE SHIELD | Admitting: Family

## 2015-11-08 VITALS — BP 140/94 | HR 74 | Temp 97.5°F | Resp 16 | Ht 67.0 in | Wt 336.6 lb

## 2015-11-08 DIAGNOSIS — I10 Essential (primary) hypertension: Secondary | ICD-10-CM

## 2015-11-08 DIAGNOSIS — M1990 Unspecified osteoarthritis, unspecified site: Secondary | ICD-10-CM | POA: Insufficient documentation

## 2015-11-08 DIAGNOSIS — E559 Vitamin D deficiency, unspecified: Secondary | ICD-10-CM

## 2015-11-08 HISTORY — DX: Unspecified osteoarthritis, unspecified site: M19.90

## 2015-11-08 MED ORDER — MELOXICAM 7.5 MG PO TABS: 7.5000 mg | ORAL_TABLET | Freq: Every day | ORAL | Status: DC | PRN

## 2015-11-08 MED ORDER — AMLODIPINE BESYLATE 10 MG PO TABS: ORAL_TABLET | ORAL | Status: DC

## 2015-11-08 NOTE — Assessment & Plan Note (Signed)
rx sent for meloxicam prn. Advised pt to take only when she really needs it, otherwise use tylenol.

## 2015-11-08 NOTE — Progress Notes (Signed)
Pre visit review using our clinic review tool, if applicable. No additional management support is needed unless otherwise documented below in the visit note. 

## 2015-11-08 NOTE — Patient Instructions (Addendum)
Check your blood pressure once daily at home, bring readings (plus your home meter) to your follow up in 2 weeks.

## 2015-11-08 NOTE — Assessment & Plan Note (Addendum)
Pt reports white coat htn. Advised pt to check bp daily at home and record and bring readings + meter to her follow up in 2 weeks. BP is high here. Continue amlodipine.

## 2015-11-08 NOTE — Assessment & Plan Note (Signed)
Continue vit D.  Plan to check labs next visit (lab is closed).

## 2015-11-08 NOTE — Progress Notes (Signed)
Subjective:    Patient ID: Shelia Mcgee, female    DOB: 02/26/1976, 40 y.o.   MRN: [id number]  HPI  Ms. Shelia Mcgee is a 40 yr old female who presents today for follo wup.  HTN- currently maintained on amlodipine 10mg . 127-129/89-92 at home.    BP Readings from Last 3 Encounters:  11/08/15 140/94  08/28/15 140/90  05/26/15 130/98   Vit D deficiency- maintained on otc vit D.   Joint pain- has seen ortho in the past. Was using nabumetone prn.  Has R hip and pain both knees.  Requests refill.    Review of Systems  Respiratory: Negative for shortness of breath.   Cardiovascular: Negative for chest pain and leg swelling.   Past Medical History  Diagnosis Date  . Hypertension     Social History   Social History  . Marital Status: Single    Spouse Name: N/A  . Number of Children: N/A  . Years of Education: N/A   Occupational History  . Not on file.   Social History Main Topics  . Smoking status: Never Smoker   . Smokeless tobacco: Never Used  . Alcohol Use: No  . Drug Use: No  . Sexual Activity: No   Other Topics Concern  . 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 Hip surgery Right 1992    States that bones were slipping. Stabilized it with 3 screws x 1 yr then had screws removed.    Family History  Problem Relation Age of Onset  . Hypertension Mother   . Hypertension Father   . Diabetes Father   . Heart attack Maternal Uncle     No Known Allergies  Current Outpatient Prescriptions on File Prior to Visit  Medication Sig Dispense Refill  . Cholecalciferol (VITAMIN D3) 3000 UNITS TABS Take 1 tablet by mouth daily. 30 tablet   . MICROGESTIN FE 1.5/30 1.5-30 MG-MCG tablet TAKE 1 TABLET DAILY 84 tablet 0  .  Multiple Vitamins-Minerals (MULTIVITAMIN WITH MINERALS) tablet Take 1 tablet by mouth daily.    . nitroGLYCERIN (NITROSTAT) 0.4 MG SL tablet Place 1 tablet (0.4 mg total) under the tongue every 5 (five) minutes as needed for chest pain. (Patient not taking: Reported on 11/08/2015) 25 tablet 3  . [DISCONTINUED] cetirizine (ZYRTEC) 10 MG tablet Take 1 tablet (10 mg total) by mouth daily. 30 tablet 11  . [DISCONTINUED] fluticasone (FLONASE) 50 MCG/ACT nasal spray Place 2 sprays into both nostrils daily. 16 g 3   No current facility-administered medications on file prior to visit.    BP 140/94 mmHg  Pulse 74  Temp(Src) 97.5 F (36.4 C) (Oral)  Resp 16  Ht 5\' 7"  (1.702 m)  Wt 336 lb 9.6 oz (152.681 kg)  BMI 52.71 kg/m2  SpO2 100%  LMP 10/20/2015       Objective:   Physical Exam  Constitutional: She appears well-developed and well-nourished.  Cardiovascular: Normal rate, regular rhythm and normal heart sounds.   No murmur heard. Pulmonary/Chest: Effort normal and breath sounds normal. No respiratory distress. She has no wheezes.  Psychiatric: She has a normal mood and affect. Her behavior is normal. Judgment and thought content normal.  Assessment & Plan:

## 2015-11-23 ENCOUNTER — Encounter: Payer: Self-pay | Admitting: Family

## 2015-11-23 ENCOUNTER — Ambulatory Visit (INDEPENDENT_AMBULATORY_CARE_PROVIDER_SITE_OTHER): Payer: BLUE CROSS/BLUE SHIELD | Admitting: Family

## 2015-11-23 ENCOUNTER — Telehealth: Payer: Self-pay | Admitting: Family

## 2015-11-23 VITALS — BP 129/93 | HR 85 | Temp 98.0°F | Resp 18 | Ht 67.0 in | Wt 332.4 lb

## 2015-11-23 DIAGNOSIS — I10 Essential (primary) hypertension: Secondary | ICD-10-CM | POA: Diagnosis not present

## 2015-11-23 LAB — BASIC METABOLIC PANEL
BUN: 13 mg/dL (ref 6–23)
CALCIUM: 9.2 mg/dL (ref 8.4–10.5)
CO2: 28 mEq/L (ref 19–32)
CREATININE: 1 mg/dL (ref 0.40–1.20)
Chloride: 105 mEq/L (ref 96–112)
GFR: 80 mL/min (ref >60.00)
Glucose, Bld: 89 mg/dL (ref 70–99)
Potassium: 3.7 mEq/L (ref 3.5–5.1)
Sodium: 139 mEq/L (ref 135–145)

## 2015-11-23 NOTE — Patient Instructions (Signed)
Please complete lab work prior to leaving.   

## 2015-11-23 NOTE — Telephone Encounter (Signed)
See mychart.  

## 2015-11-23 NOTE — Progress Notes (Signed)
Subjective:    Patient ID: Shelia GarnetShanca Mcgee, female    DOB: 02/10/1976, 40 y.o.   MRN: [id number]  HPI  Ms.  Shelia Mcgee is a 40 yr old female who presents today for follow up of her blood pressure.  Reports good compliance with medications.  Continues low sodium diet, increasing walking.  BP Readings from Last 3 Encounters:  11/23/15 129/93  11/08/15 140/94  08/28/15 140/90     Review of Systems  Constitutional: Negative for fatigue.  Cardiovascular:       Mild pedal edema at the end of the day  Neurological: Negative for dizziness, syncope, weakness and headaches.   Past Medical History  Diagnosis Date  . Hypertension     Social History   Social History  . Marital Status: Single    Spouse Name: N/A  . Number of Children: N/A  . Years of Education: N/A   Occupational History  . Not on file.   Social History Main Topics  . Smoking status: Never Smoker   . Smokeless tobacco: Never Used  . Alcohol Use: No  . Drug Use: No  . Sexual Activity: No   Other Topics Concern  . 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 Hip surgery Right 1992    States that bones were slipping. Stabilized it with 3 screws x 1 yr then had screws removed.    Family History  Problem Relation Age of Onset  . Hypertension Mother   . Hypertension Father   . Diabetes Father   . Heart attack Maternal Uncle     No Known Allergies  Current Outpatient Prescriptions on File Prior to Visit  Medication Sig Dispense Refill  . amLODipine (NORVASC) 10 MG tablet TAKE 1 TABLET DAILY (DISCONTINUE PREVIOUS PRESCRIPTIONS FOR THIS MEDICATION) 90 tablet 0  . Cholecalciferol (VITAMIN D3) 3000 UNITS TABS Take 1 tablet by mouth daily. 30 tablet   . meloxicam  (MOBIC) 7.5 MG tablet Take 1 tablet (7.5 mg total) by mouth daily as needed for pain. 90 tablet 0  . MICROGESTIN FE 1.5/30 1.5-30 MG-MCG tablet TAKE 1 TABLET DAILY 84 tablet 0  . Multiple Vitamins-Minerals (MULTIVITAMIN WITH MINERALS) tablet Take 1 tablet by mouth daily.    . nitroGLYCERIN (NITROSTAT) 0.4 MG SL tablet Place 1 tablet (0.4 mg total) under the tongue every 5 (five) minutes as needed for chest pain. 25 tablet 3  . [DISCONTINUED] cetirizine (ZYRTEC) 10 MG tablet Take 1 tablet (10 mg total) by mouth daily. 30 tablet 11  . [DISCONTINUED] fluticasone (FLONASE) 50 MCG/ACT nasal spray Place 2 sprays into both nostrils daily. 16 g 3   No current facility-administered medications on file prior to visit.    BP 129/93 mmHg  Pulse 85  Temp(Src) 98 F (36.7 C) (Oral)  Resp 18  Ht 5\' 7"  (1.702 m)  Wt 332 lb 6.4 oz (150.776 kg)  BMI 52.05 kg/m2  SpO2 98%  LMP 11/17/2015       Objective:   Physical Exam  Constitutional: She is oriented to person, place, and time. She appears well-developed and well-nourished.  HENT:  Head: Normocephalic and atraumatic.  Cardiovascular: Normal rate, regular rhythm and normal heart sounds.   No murmur heard.  Pulmonary/Chest: Effort normal and breath sounds normal. No respiratory distress. She has no wheezes.  Musculoskeletal:  Trace bilateral LE edema  Neurological: [name] and oriented to person, place, and time.  Skin: Skin is warm.  Psychiatric: She has a normal mood and affect. Her behavior is normal. Judgment and thought content normal.          Assessment & Plan:

## 2015-11-23 NOTE — Assessment & Plan Note (Signed)
DBP borderline elevated. I will ask pt to check at home daily x 1 week and send me readings via mychart.  If DBP remains 90 or greater consider addition an additional medication.

## 2015-11-23 NOTE — Progress Notes (Signed)
Pre visit review using our clinic review tool, if applicable. No additional management support is needed unless otherwise documented below in the visit note. 

## 2015-12-02 ENCOUNTER — Encounter: Payer: Self-pay | Admitting: Family

## 2016-01-11 ENCOUNTER — Encounter (HOSPITAL_BASED_OUTPATIENT_CLINIC_OR_DEPARTMENT_OTHER): Payer: Self-pay | Admitting: *Deleted

## 2016-01-11 ENCOUNTER — Emergency Department (HOSPITAL_BASED_OUTPATIENT_CLINIC_OR_DEPARTMENT_OTHER)
Admission: EM | Admit: 2016-01-11 | Discharge: 2016-01-11 | Disposition: A | Discharge status: Home / Self Care | Payer: BLUE CROSS/BLUE SHIELD | Attending: Emergency Medicine | Admitting: Emergency Medicine

## 2016-01-11 ENCOUNTER — Emergency Department (HOSPITAL_BASED_OUTPATIENT_CLINIC_OR_DEPARTMENT_OTHER): Payer: BLUE CROSS/BLUE SHIELD

## 2016-01-11 DIAGNOSIS — I10 Essential (primary) hypertension: Secondary | ICD-10-CM | POA: Diagnosis not present

## 2016-01-11 DIAGNOSIS — M79641 Pain in right hand: Secondary | ICD-10-CM | POA: Diagnosis present

## 2016-01-11 DIAGNOSIS — M25531 Pain in right wrist: Secondary | ICD-10-CM

## 2016-01-11 NOTE — ED Notes (Signed)
Pain in her right forearm, wrist and hand since yesterday. Started after moving her arm so she is not sure if she injured her arm.

## 2016-01-11 NOTE — ED Provider Notes (Signed)
CSN: [id number]     Arrival date & time 01/11/16  1907 History   First MD Initiated Contact with Patient 01/11/16 1927     Chief Complaint  Patient presents with  . Hand Pain     (Consider location/radiation/quality/duration/timing/severity/associated sxs/prior Treatment) HPI   40 year old female presents emergency Department with chief complaint of right wrist and hand pain. Patient states that yesterday she was washing dishes when her foot slipped. She reached out with her right hand, twisting her wrist in a supinated position to catch the phone. She states she had some pain that has been persistent since that time. She denies severe pain, numbness or tingling, inability to use the wrist. No previous history of wrist injuries.  Past Medical History  Diagnosis Date  . Hypertension    Past Surgical History  Procedure Laterality Date  . Hip surgery Right 1992    States that bones were slipping. Stabilized it with 3 screws x 1 yr then had screws removed.   Family History  Problem Relation Age of Onset  . Hypertension Mother   . Hypertension Father   . Diabetes Father   . Heart attack Maternal Uncle    Social History  Substance Use Topics  . Smoking status: Never Smoker   . Smokeless tobacco: Never Used  . Alcohol Use: No   OB History    No data available     Review of Systems  Ten systems reviewed and are negative for acute change, except as noted in the HPI.    Allergies  Review of patient's allergies indicates no known allergies.  Home Medications   Prior to Admission medications   Medication Sig Start Date End Date Taking? Authorizing Provider  amLODipine (NORVASC) 10 MG tablet TAKE 1 TABLET DAILY (DISCONTINUE PREVIOUS PRESCRIPTIONS FOR THIS MEDICATION) 11/08/15   Sandford Craze, NP  Cholecalciferol (VITAMIN D3) 3000 UNITS TABS Take 1 tablet by mouth daily. 08/17/15   Sandford Craze, NP  meloxicam (MOBIC) 7.5 MG tablet Take 1 tablet (7.5 mg total) by mouth  daily as needed for pain. 11/08/15   Sandford Craze, NP  MICROGESTIN FE 1.5/30 1.5-30 MG-MCG tablet TAKE 1 TABLET DAILY 10/18/15   Sandford Craze, NP  Multiple Vitamins-Minerals (MULTIVITAMIN WITH MINERALS) tablet Take 1 tablet by mouth daily.    Historical Provider, MD  nitroGLYCERIN (NITROSTAT) 0.4 MG SL tablet Place 1 tablet (0.4 mg total) under the tongue every 5 (five) minutes as needed for chest pain. 11/24/14   Wendall Stade, MD   BP 150/98 mmHg  Pulse 78  Temp(Src) 98.7 F (37.1 C) (Oral)  Resp 20  Ht  (1.702 m)  Wt 145.151 kg  BMI 50.11 kg/m2  SpO2 100%  LMP 01/04/2016 Physical Exam  Constitutional: She is oriented to person, place, and time. She appears well-developed and well-nourished. No distress.  HENT:  Head: Normocephalic and atraumatic.  Eyes: Conjunctivae are normal. No scleral icterus.  Neck: Normal range of motion.  Cardiovascular: Normal rate, regular rhythm and normal heart sounds.  Exam reveals no gallop and no friction rub.   No murmur heard. Pulmonary/Chest: Effort normal and breath sounds normal. No respiratory distress.  Abdominal: Soft. Bowel sounds are normal. She exhibits no distension and no mass. There is no tenderness. There is no guarding.  Musculoskeletal:  Right hand and wrist exam are performed. She is tender along the dorsal surface of the wrist. No swelling or bruising. Full range of motion of the wrist and hand including flexion, extension, ulnar  and radial deviation. Full strength. Strong grip strength with flexion and extension, abduction, abduction and opposition of the fingers. Neurovascularly intact.  Neurological: [name] and oriented to person, place, and time.  Skin: Skin is warm and dry. She is not diaphoretic.  Nursing note and vitals reviewed.   ED Course  Procedures (including critical care time) Labs Review Labs Reviewed - No data to display  Imaging Review Dg Hand Complete Right  01/11/2016  CLINICAL DATA:   Pain in the hand after catching an item last night. Third and fourth metacarpal pain extending to the wrist. EXAM: RIGHT HAND - COMPLETE 3+ VIEW COMPARISON:  08/17/2012 FINDINGS: Several small ossicles lateral to the distal pole of the scaphoid. A smaller ossicle was shown back in 2013. I doubt that these are acute given their well corticated appearance. Slightly triangular shape of the lunate on the frontal projection, similar to the 2013 exam, but with normal scapholunate angle and normal lunate -capitate relationship on the lateral projection. Small geode in the proximal pole of the hamate. I do not see a fracture. IMPRESSION: 1. Small ossicles adjacent to the lateral margin of the scaphoid, probably chronic given the appearance. 2. Small geode in the proximal pole of the hamate. 3. If pain persists despite conservative therapy, MRI may be warranted for further characterization. Electronically Signed   By: Gaylyn RongWalter  Liebkemann M.D.   On: 01/11/2016 19:42   I have personally reviewed and evaluated these images and lab results as part of my medical decision-making.   EKG Interpretation None      MDM   Final diagnoses:  None    Patient with wrist pain. I suspect she pulled a muscle. We'll place the patient in a wrist splint, ice and anti-inflammatories. Follow-up with or the hand specialist if continuing to have pain. Pierce safe for discharge at this time. Discussed return positive.    Arthor CaptainAbigail Marilea Gwynne, PA-C 01/11/16 2102  Geoffery Lyonsouglas Delo, MD 01/11/16 2220

## 2016-01-11 NOTE — ED Notes (Signed)
Patient transported to X-ray 

## 2016-01-11 NOTE — Discharge Instructions (Signed)
Use OTC aleve 2 x a day for the next 5-7 days. Take it with a meal  Ice your wrist 3 times a day. Wear the brace at all times except with bathing.  Wrist Pain There are many things that can cause wrist pain. Some common causes include:  An injury to the wrist area, such as a sprain, strain, or fracture.  Overuse of the joint.  A condition that causes increased pressure on a nerve in the wrist (carpal tunnel syndrome).  Wear and tear of the joints that occurs with aging (osteoarthritis).  A variety of other types of arthritis. Sometimes, the cause of wrist pain is not known. The pain often goes away when you follow your health care provider's instructions for relieving pain at home. If your wrist pain continues, tests may need to be done to diagnose your condition. HOME CARE INSTRUCTIONS Pay attention to any changes in your symptoms. Take these actions to help with your pain:  Rest the wrist area for at least 48 hours or as told by your health care provider.  If directed, apply ice to the injured area:  Put ice in a plastic bag.  Place a towel between your skin and the bag.  Leave the ice on for 20 minutes, 2-3 times per day.  Keep your arm raised (elevated) above the level of your heart while you are sitting or lying down.  If a splint or elastic bandage has been applied, use it as told by your health care provider.  Remove the splint or bandage only as told by your health care provider.  Loosen the splint or bandage if your fingers become numb or have a tingling feeling, or if they turn cold or blue.  Take over-the-counter and prescription medicines only as told by your health care provider.  Keep all follow-up visits as told by your health care provider. This is important. SEEK MEDICAL CARE IF:  Your pain is not helped by treatment.  Your pain gets worse. SEEK IMMEDIATE MEDICAL CARE IF:  Your fingers become swollen.  Your fingers turn white, very red, or cold and  blue.  Your fingers are numb or have a tingling feeling.  You have difficulty moving your fingers.   This information is not intended to replace advice given to you by your health care provider. Make sure you discuss any questions you have with your health care provider.   Document Released: 05/31/2005 Document Revised: 05/12/2015 Document Reviewed: 01/06/2015 Elsevier Interactive Patient Education [employer]2016 Elsevier Inc.

## 2016-02-07 ENCOUNTER — Telehealth: Payer: Self-pay | Admitting: Family

## 2016-02-07 ENCOUNTER — Encounter: Payer: Self-pay | Admitting: Family

## 2016-02-07 MED ORDER — FLUCONAZOLE 150 MG PO TABS: ORAL_TABLET | ORAL | Status: DC

## 2016-02-07 NOTE — Telephone Encounter (Addendum)
°  Pharmacy: [id number] - [place], [place] - [address] [phone] (Phone) [phone] (Fax)         Reason for call:  Ivy Lynn[pharmacy states patient called requesting antidepressant medication (patient doesn't know the name of medication) please advise

## 2016-02-07 NOTE — Telephone Encounter (Signed)
Spoke with pharmacy. States they did not receive diflucan Rx. Verbal given to pharmacist, notified pt.

## 2016-02-07 NOTE — Telephone Encounter (Signed)
Patient states pharmacy never received fluconazole (DIFLUCAN) 150 MG tablet. Please disregard message below

## 2016-02-16 ENCOUNTER — Other Ambulatory Visit: Payer: Self-pay | Admitting: Family

## 2016-02-28 ENCOUNTER — Ambulatory Visit (HOSPITAL_BASED_OUTPATIENT_CLINIC_OR_DEPARTMENT_OTHER)
Admission: RE | Admit: 2016-02-28 | Discharge: 2016-02-28 | Disposition: A | Discharge status: Home / Self Care | Payer: BLUE CROSS/BLUE SHIELD | Source: Ambulatory Visit | Attending: Family | Admitting: Family

## 2016-02-28 ENCOUNTER — Encounter: Payer: Self-pay | Admitting: Family

## 2016-02-28 ENCOUNTER — Ambulatory Visit (INDEPENDENT_AMBULATORY_CARE_PROVIDER_SITE_OTHER): Payer: BLUE CROSS/BLUE SHIELD | Admitting: Family

## 2016-02-28 VITALS — BP 132/86 | HR 69 | Temp 97.7°F | Resp 18 | Ht 67.0 in | Wt 322.6 lb

## 2016-02-28 DIAGNOSIS — M25561 Pain in right knee: Secondary | ICD-10-CM

## 2016-02-28 DIAGNOSIS — M25551 Pain in right hip: Secondary | ICD-10-CM | POA: Insufficient documentation

## 2016-02-28 DIAGNOSIS — I10 Essential (primary) hypertension: Secondary | ICD-10-CM | POA: Diagnosis not present

## 2016-02-28 LAB — POCT URINE HCG BY VISUAL COLOR COMPARISON TESTS: Preg Test, Ur: NEGATIVE

## 2016-02-28 NOTE — Progress Notes (Signed)
Subjective:    Patient ID: Shelia Mcgee, female    DOB: 03/26/1976, 40 y.o.   MRN: [id number]  HPI   Ms. Shelia Mcgee is a 40 yr old female who presents today for follow up of hypertension.  Current medications include amlodipine 10mg  once daily. Reports good compliance with medications. Notes mild LE edema.    BP Readings from Last 3 Encounters:  02/28/16 132/86  01/11/16 150/98  11/23/15 129/93   R hip pain- reports that this occurs after prolonged sitting. Also can occur with walking.  Reports bilateral knee pain R>L. Pt is requesting x-rays to further evaluate.    Review of Systems  Respiratory: Negative for shortness of breath.   Cardiovascular: Negative for chest pain.   Past Medical History  Diagnosis Date  . Hypertension      Social History   Social History  . Marital Status: Single    Spouse Name: N/A  . Number of Children: N/A  . Years of Education: N/A   Occupational History  . Not on file.   Social History Main Topics  . Smoking status: Never Smoker   . Smokeless tobacco: Never Used  . Alcohol Use: No  . Drug Use: No  . Sexual Activity: No   Other Topics Concern  . 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 Hip surgery Right 1992    States that bones were slipping. Stabilized it with 3 screws x 1 yr then had screws removed.    Family History  Problem Relation Age of Onset  . Hypertension Mother   . Hypertension Father   . Diabetes Father   . Heart attack Maternal Uncle     No Known Allergies  Current Outpatient Prescriptions on File Prior to Visit  Medication Sig Dispense Refill  . amLODipine (NORVASC) 10 MG tablet TAKE 1 TABLET DAILY (DISCONTINUE PREVIOUS PRESCRIPTIONS FOR THIS MEDICATION) 90  tablet 1  . Cholecalciferol (VITAMIN D3) 3000 UNITS TABS Take 1 tablet by mouth daily. 30 tablet   . fluconazole (DIFLUCAN) 150 MG tablet 1 tab by mouth today, may repeat in 3 days if symptoms are not resolved. 2 tablet 0  . meloxicam (MOBIC) 7.5 MG tablet Take 1 tablet (7.5 mg total) by mouth daily as needed for pain. 90 tablet 0  . MICROGESTIN FE 1.5/30 1.5-30 MG-MCG tablet TAKE 1 TABLET DAILY 84 tablet 1  . Multiple Vitamins-Minerals (MULTIVITAMIN WITH MINERALS) tablet Take 1 tablet by mouth daily.    . nitroGLYCERIN (NITROSTAT) 0.4 MG SL tablet Place 1 tablet (0.4 mg total) under the tongue every 5 (five) minutes as needed for chest pain. 25 tablet 3  . [DISCONTINUED] cetirizine (ZYRTEC) 10 MG tablet Take 1 tablet (10 mg total) by mouth daily. 30 tablet 11  . [DISCONTINUED] fluticasone (FLONASE) 50 MCG/ACT nasal spray Place 2 sprays into both nostrils daily. 16 g 3   No current facility-administered medications on file prior to visit.    BP 132/86 mmHg  Pulse 69  Temp(Src) 97.7 F (36.5 C) (Oral)  Resp 18  Ht 5\' 7"  (1.702 m)  Wt 322 lb 9.6 oz (146.33 kg)  BMI 50.51 kg/m2  SpO2 98%  LMP 02/05/2016  Objective:   Physical Exam  Constitutional: She appears well-developed and well-nourished.  Cardiovascular: Normal rate, regular rhythm and normal heart sounds.   No murmur heard. Pulmonary/Chest: Effort normal and breath sounds normal. No respiratory distress. She has no wheezes.  Musculoskeletal:  Trace bilateral LE edema  Psychiatric: She has a normal mood and affect. Her behavior is normal. Judgment and thought content normal.          Assessment & Plan:  HTN- stable on current meds, obtain bmet.  R hip pain- will obtain R hip x ray to further evaluated for DJD, discussed importance of weight loss. Check urine HCG prior to x rays.   R knee pain- obtain x ray to further evaluate.

## 2016-02-28 NOTE — Addendum Note (Signed)
Addended by: Mervin KungFERGERSON, Merla Sawka A on: 02/28/2016 07:29 PM   Modules accepted: Orders, SmartSet

## 2016-02-28 NOTE — Progress Notes (Signed)
Pre visit review using our clinic review tool, if applicable. No additional management support is needed unless otherwise documented below in the visit note. 

## 2016-02-28 NOTE — Patient Instructions (Addendum)
Please complete your x rays on the first floor. Follow up as scheduled for your physical in September.

## 2016-02-29 ENCOUNTER — Encounter: Payer: Self-pay | Admitting: Family

## 2016-05-30 ENCOUNTER — Encounter: Payer: Self-pay | Admitting: Family

## 2016-05-30 ENCOUNTER — Ambulatory Visit (INDEPENDENT_AMBULATORY_CARE_PROVIDER_SITE_OTHER): Payer: BLUE CROSS/BLUE SHIELD | Admitting: Family

## 2016-05-30 DIAGNOSIS — Z23 Encounter for immunization: Secondary | ICD-10-CM | POA: Diagnosis not present

## 2016-05-30 DIAGNOSIS — Z0000 Encounter for general adult medical examination without abnormal findings: Secondary | ICD-10-CM | POA: Diagnosis not present

## 2016-05-30 DIAGNOSIS — I10 Essential (primary) hypertension: Secondary | ICD-10-CM

## 2016-05-30 LAB — CBC WITH DIFFERENTIAL/PLATELET
BASOS ABS: 0 10*3/uL (ref 0.0–0.1)
Basophils Relative: 0.4 % (ref 0.0–3.0)
EOS PCT: 1.6 % (ref 0.0–5.0)
Eosinophils Absolute: 0.1 10*3/uL (ref 0.0–0.7)
HCT: 40.8 % (ref 36.0–46.0)
Hemoglobin: 13.5 g/dL (ref 12.0–15.0)
LYMPHS ABS: 2.3 10*3/uL (ref 0.7–4.0)
Lymphocytes Relative: 37.2 % (ref 12.0–46.0)
MCHC: 33.1 g/dL (ref 30.0–36.0)
MCV: 80.2 fl (ref 78.0–100.0)
MONO ABS: 0.4 10*3/uL (ref 0.1–1.0)
MONOS PCT: 6.7 % (ref 3.0–12.0)
NEUTROS ABS: 3.4 10*3/uL (ref 1.4–7.7)
NEUTROS PCT: 54.1 % (ref 43.0–77.0)
PLATELETS: 397 10*3/uL (ref 150.0–400.0)
RBC: 5.08 Mil/uL (ref 3.87–5.11)
RDW: 15.9 % — ABNORMAL HIGH (ref 11.5–15.5)
WBC: 6.3 10*3/uL (ref 4.0–10.5)

## 2016-05-30 LAB — URINALYSIS, ROUTINE W REFLEX MICROSCOPIC
BILIRUBIN URINE: NEGATIVE
KETONES UR: NEGATIVE
LEUKOCYTES UA: NEGATIVE
Nitrite: NEGATIVE
PH: 6.5 (ref 5.0–8.0)
Specific Gravity, Urine: 1.01 (ref 1.000–1.030)
TOTAL PROTEIN, URINE-UPE24: NEGATIVE
UROBILINOGEN UA: 0.2 (ref 0.0–1.0)
Urine Glucose: NEGATIVE

## 2016-05-30 LAB — BASIC METABOLIC PANEL
BUN: 15 mg/dL (ref 6–23)
CALCIUM: 9.1 mg/dL (ref 8.4–10.5)
CO2: 30 meq/L (ref 19–32)
Chloride: 101 mEq/L (ref 96–112)
Creatinine, Ser: 0.99 mg/dL (ref 0.40–1.20)
GFR: 80.71 mL/min (ref >60.00)
GLUCOSE: 79 mg/dL (ref 70–99)
Potassium: 4.2 mEq/L (ref 3.5–5.1)
SODIUM: 138 meq/L (ref 135–145)

## 2016-05-30 LAB — HEPATIC FUNCTION PANEL
ALBUMIN: 3.8 g/dL (ref 3.5–5.2)
ALK PHOS: 44 U/L (ref 39–117)
ALT: 15 U/L (ref 0–35)
AST: 19 U/L (ref 0–37)
BILIRUBIN TOTAL: 0.5 mg/dL (ref 0.2–1.2)
Bilirubin, Direct: 0 mg/dL (ref 0.0–0.3)
TOTAL PROTEIN: 7.5 g/dL (ref 6.0–8.3)

## 2016-05-30 LAB — LIPID PANEL
Cholesterol: 153 mg/dL (ref 0–200)
HDL: 52.7 mg/dL (ref >39.00)
LDL Cholesterol: 85 mg/dL (ref 0–99)
NONHDL: 99.9
Total CHOL/HDL Ratio: 3
Triglycerides: 73 mg/dL (ref 0.0–149.0)
VLDL: 14.6 mg/dL (ref 0.0–40.0)

## 2016-05-30 LAB — TSH: TSH: 0.81 u[IU]/mL (ref 0.35–4.50)

## 2016-05-30 MED ORDER — AMLODIPINE BESYLATE 10 MG PO TABS: ORAL_TABLET | ORAL | 1 refills | Status: DC

## 2016-05-30 NOTE — Progress Notes (Signed)
Subjective:    Patient ID: Shelia Mcgee, female    DOB: 02/10/1976, 40 y.o.   MRN: [id number]  HPI  Shelia Mcgee is a 40 yr old female who presents today for cpx.  Immunizations: tetanus is up to date.  Flu shot today Diet: will begin packing a lunch.  Exercise: not exercising as much as she would like Pap Smear: 2016, normal Dental: up to date Vision: due  Wt Readings from Last 3 Encounters:  05/30/16 (!) 323 lb 12.8 oz (146.9 kg)  02/28/16 (!) 322 lb 9.6 oz (146.3 kg)  01/11/16 (!) 320 lb (145.2 kg)   HTN- reports dbp 85-90 at home, sbp 122-132 BP Readings from Last 3 Encounters:  05/30/16 137/90  02/28/16 132/86  01/11/16 150/98    Review of Systems  Constitutional: Positive for unexpected weight change.  HENT: Negative for hearing loss and rhinorrhea.   Eyes: Negative for visual disturbance.  Respiratory: Negative for cough.   Cardiovascular: Negative for leg swelling.  Gastrointestinal: Negative for constipation and diarrhea.  Genitourinary: Negative for dysuria and frequency.  Musculoskeletal: Negative for arthralgias and myalgias.  Skin: Negative for rash.  Neurological: Negative for headaches.  Hematological: Negative for adenopathy.  Psychiatric/Behavioral:       Denies anxiety/depression       Past Medical History:  Diagnosis Date  . Hypertension      Social History   Social History  . Marital status: Single    Spouse name: N/A  . Number of children: N/A  . Years of education: N/A   Occupational History  . Not on file.   Social History Main Topics  . Smoking status: Never Smoker  . Smokeless tobacco: Never Used  . Alcohol use No  . Drug use: No  . Sexual activity: No   Other Topics Concern  . 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 HIP SURGERY Right 1992   States that bones were slipping. Stabilized it with 3 screws x 1 yr then had screws removed.    Family History  Problem Relation Age of Onset  . Hypertension Mother   . Hypertension Father   . Diabetes Father   . Heart attack Maternal Uncle     No Known Allergies  Current Outpatient Prescriptions on File Prior to Visit  Medication Sig Dispense Refill  . amLODipine (NORVASC) 10 MG tablet TAKE 1 TABLET DAILY (DISCONTINUE PREVIOUS PRESCRIPTIONS FOR THIS MEDICATION) 90 tablet 1  . Cholecalciferol (VITAMIN D3) 3000 UNITS TABS Take 1 tablet by mouth daily. 30 tablet   . fluconazole (DIFLUCAN) 150 MG tablet 1 tab by mouth today, may repeat in 3 days if symptoms are not resolved. 2 tablet 0  . meloxicam (MOBIC) 7.5 MG tablet Take 1 tablet (7.5 mg total) by mouth daily as needed for pain. 90 tablet 0  . MICROGESTIN FE 1.5/30 1.5-30 MG-MCG tablet TAKE 1 TABLET DAILY 84 tablet 1  . Multiple Vitamins-Minerals (MULTIVITAMIN WITH MINERALS) tablet Take 1 tablet by mouth daily.    . nitroGLYCERIN (NITROSTAT) 0.4 MG SL tablet Place 1 tablet (0.4 mg total) under the tongue every 5 (five) minutes as needed for chest pain. 25 tablet 3  . [DISCONTINUED] cetirizine (ZYRTEC) 10  MG tablet Take 1 tablet (10 mg total) by mouth daily. 30 tablet 11  . [DISCONTINUED] fluticasone (FLONASE) 50 MCG/ACT nasal spray Place 2 sprays into both nostrils daily. 16 g 3   No current facility-administered medications on file prior to visit.     BP 137/90 (BP Location: Right Arm, Cuff Size: Large)   Pulse 80   Temp 98.1 F (36.7 C) (Oral)   Resp 18   Ht 5' 6.5" (1.689 m)   Wt (!) 323 lb 12.8 oz (146.9 kg)   LMP 05/30/2016   SpO2 100% Comment: room air  BMI 51.48 kg/m    Objective:   Physical Exam Physical Exam  Constitutional: She is oriented to person, place, and time. She appears well-developed and well-nourished. No distress.  HENT:    Head: Normocephalic and atraumatic.  Right Ear: Tympanic membrane and ear canal normal.  Left Ear: Tympanic membrane and ear canal normal.  Mouth/Throat: Oropharynx is clear and moist.  Eyes: Pupils are equal, round, and reactive to light. No scleral icterus.  Neck: Normal range of motion. No thyromegaly present.  Cardiovascular: Normal rate and regular rhythm.   No murmur heard. Pulmonary/Chest: Effort normal and breath sounds normal. No respiratory distress. He has no wheezes. She has no rales. She exhibits no tenderness.  Abdominal: Soft. Bowel sounds are normal. She exhibits no distension and no mass. There is no tenderness. There is no rebound and no guarding.  Musculoskeletal: She exhibits no edema.  Lymphadenopathy:    She has no cervical adenopathy.  Neurological: [name] and oriented to person, place, and time. She has normal patellar reflexes. She exhibits normal muscle tone. Coordination normal.  Skin: Skin is warm and dry.  Psychiatric: She has a normal mood and affect. Her behavior is normal. Judgment and thought content normal.  Breasts: Examined lying Right: Without masses, retractions, discharge or axillary adenopathy.  Left: Without masses, retractions, discharge or axillary adenopathy.  Pelvic: deferred     Assessment & Plan:          Assessment & Plan:

## 2016-05-30 NOTE — Progress Notes (Signed)
Pre visit review using our clinic review tool, if applicable. No additional management support is needed unless otherwise documented below in the visit note. 

## 2016-05-30 NOTE — Assessment & Plan Note (Signed)
DBP a bit high today.  Advised pt to continue to monitor her home BP's and call if she sees multiple DBP >90.  Otherwise, continue current meds and plan repeat bp in the office in 3 months.

## 2016-05-30 NOTE — Patient Instructions (Addendum)
Please complete lab work prior to leaving. Continue to work on healthy diet, exercise, weight loss.  

## 2016-05-30 NOTE — Assessment & Plan Note (Addendum)
Discussed healthy diet, exercise and weight loss.  Obtain routine labs.  Immunizations reviewed and up to date.

## 2016-06-14 ENCOUNTER — Encounter: Payer: Self-pay | Admitting: Family

## 2016-06-14 ENCOUNTER — Other Ambulatory Visit: Payer: Self-pay | Admitting: Family

## 2016-06-14 MED ORDER — FLUCONAZOLE 150 MG PO TABS: ORAL_TABLET | ORAL | 0 refills | Status: DC

## 2016-06-15 ENCOUNTER — Telehealth: Payer: Self-pay | Admitting: Family

## 2016-06-15 NOTE — Telephone Encounter (Signed)
Spoke with pharmacy tech to verify medication was filled for patient. Pharmacy tech states that they've received medication request.

## 2016-06-15 NOTE — Telephone Encounter (Signed)
fluconazole (DIFLUCAN) 150 MG tablet  Patient requested this medication and it was sent in yesterday. However patient states that the pharmacy still does not have the prescription. Can we please resend?

## 2016-08-22 ENCOUNTER — Encounter: Payer: Self-pay | Admitting: Family

## 2016-08-22 ENCOUNTER — Telehealth: Payer: Self-pay | Admitting: Family

## 2016-08-22 ENCOUNTER — Other Ambulatory Visit: Payer: Self-pay | Admitting: Family

## 2016-08-22 ENCOUNTER — Ambulatory Visit (INDEPENDENT_AMBULATORY_CARE_PROVIDER_SITE_OTHER): Payer: BLUE CROSS/BLUE SHIELD | Admitting: Family

## 2016-08-22 VITALS — BP 141/108 | HR 77 | Temp 98.1°F | Resp 16 | Ht 66.5 in | Wt 325.4 lb

## 2016-08-22 DIAGNOSIS — I10 Essential (primary) hypertension: Secondary | ICD-10-CM

## 2016-08-22 DIAGNOSIS — R0789 Other chest pain: Secondary | ICD-10-CM

## 2016-08-22 MED ORDER — METOPROLOL SUCCINATE ER 25 MG PO TB24: 25.0000 mg | ORAL_TABLET | Freq: Every day | ORAL | 1 refills | Status: DC

## 2016-08-22 NOTE — Telephone Encounter (Signed)
Left detailed message on cell and to call if any questions. 

## 2016-08-22 NOTE — Telephone Encounter (Signed)
Please let pt know that her EKG is normal, but I would like for her to meet with cardiology again for follow up to see if they want her to do any additional testing.

## 2016-08-22 NOTE — Progress Notes (Signed)
Pre visit review using our clinic review tool, if applicable. No additional management support is needed unless otherwise documented below in the visit note. 

## 2016-08-22 NOTE — Progress Notes (Signed)
Subjective:    Patient ID: Shelia Mcgee, female    DOB: 11/20/1975, 40 y.o.   MRN: [id number]  HPI   Shelia Mcgee is a 40 yr old female who presents today for follow up of Shelia Mcgee hypertension. Shelia Mcgee is maintained on amlodipine 10mg .    BP Readings from Last 3 Encounters:  08/22/16 (!) 141/108  05/30/16 137/90  02/28/16 132/86   Notes intermittent chest pressure which is associated with stress.   Review of Systems See HPI  Past Medical History:  Diagnosis Date  . Hypertension      Social History   Social History  . Marital status: Single    Spouse name: N/A  . Number of children: N/A  . Years of education: N/A   Occupational History  . Not on file.   Social History Main Topics  . Smoking status: Never Smoker  . Smokeless tobacco: Never Used  . Alcohol use No  . Drug use: No  . Sexual activity: No   Other Topics Concern  . 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 HIP SURGERY Right 1992   States that bones were slipping. Stabilized it with 3 screws x 1 yr then had screws removed.    Family History  Problem Relation Age of Onset  . Hypertension Mother   . Hypertension Father   . Diabetes Father   . Heart attack Maternal Uncle     No Known Allergies  Current Outpatient Prescriptions on File Prior to Visit  Medication Sig Dispense Refill  . amLODipine (NORVASC) 10 MG tablet TAKE 1 TABLET DAILY (DISCONTINUE PREVIOUS PRESCRIPTIONS FOR THIS MEDICATION) 90 tablet 1  . Cholecalciferol (VITAMIN D3) 3000 UNITS TABS Take 1 tablet by mouth daily. 30 tablet   . meloxicam (MOBIC) 7.5 MG tablet TAKE 1 TABLET DAILY AS NEEDED FOR PAIN 90 tablet 0  . MICROGESTIN FE 1.5/30 1.5-30 MG-MCG tablet TAKE 1 TABLET DAILY 84 tablet 1    . Multiple Vitamins-Minerals (MULTIVITAMIN WITH MINERALS) tablet Take 1 tablet by mouth daily.    . nitroGLYCERIN (NITROSTAT) 0.4 MG SL tablet Place 1 tablet (0.4 mg total) under the tongue every 5 (five) minutes as needed for chest pain. 25 tablet 3  . [DISCONTINUED] cetirizine (ZYRTEC) 10 MG tablet Take 1 tablet (10 mg total) by mouth daily. 30 tablet 11  . [DISCONTINUED] fluticasone (FLONASE) 50 MCG/ACT nasal spray Place 2 sprays into both nostrils daily. 16 g 3   No current facility-administered medications on file prior to visit.     BP (!) 141/108 (BP Location: Right Arm, Cuff Size: Normal) Comment (BP Location): forearm  Pulse 77   Temp 98.1 F (36.7 C) (Oral)   Resp 16   Ht 5' 6.5" (1.689 m)   Wt (!) 325 lb 6.4 oz (147.6 kg)   LMP 08/20/2016   SpO2 98% Comment: room air  BMI 51.73 kg/m       Objective:   Physical Exam  Constitutional: Shelia Mcgee is oriented to person, place, and time. Shelia Mcgee appears well-developed and well-nourished.  HENT:  Head: Normocephalic and atraumatic.  Eyes: No scleral icterus.  Cardiovascular: Normal rate and regular rhythm.   No murmur heard. Pulmonary/Chest: Effort normal and breath sounds normal.  No respiratory distress. Shelia Mcgee has no wheezes. Shelia Mcgee has no rales.  Musculoskeletal: Shelia Mcgee exhibits no edema.  Neurological: Shelia Mcgee is alert and oriented to person, place, and time.  Psychiatric: Shelia Mcgee has a normal mood and affect. Shelia Mcgee behavior is normal. Judgment and thought content normal.          Assessment & Plan:  Atypical CP- EKG tracing is personally reviewed.  EKG notes NSR.  No acute changes.  Had cardiology consult and Stress test (?breast attenuation) back in 2015. Will arrange cardiology follow up.

## 2016-08-22 NOTE — Patient Instructions (Addendum)
Please add Toprol 25mg  once daily for blood pressure. Go to the ER if you develop severe or recurrent chest pain.

## 2016-08-22 NOTE — Assessment & Plan Note (Signed)
Uncontrolled.  Add toprol xl, continue amlodipine.

## 2016-08-23 ENCOUNTER — Other Ambulatory Visit: Payer: Self-pay | Admitting: *Deleted

## 2016-08-23 ENCOUNTER — Encounter: Payer: Self-pay | Admitting: Family

## 2016-08-23 MED ORDER — MICROGESTIN FE 1.5/30 1.5-30 MG-MCG PO TABS: 1.0000 | ORAL_TABLET | Freq: Every day | ORAL | 3 refills | Status: DC

## 2016-08-24 ENCOUNTER — Other Ambulatory Visit: Payer: Self-pay | Admitting: Family

## 2016-08-24 ENCOUNTER — Other Ambulatory Visit: Payer: Self-pay

## 2016-08-24 MED ORDER — MICROGESTIN FE 1.5/30 1.5-30 MG-MCG PO TABS: 1.0000 | ORAL_TABLET | Freq: Every day | ORAL | 0 refills | Status: DC

## 2016-08-24 MED FILL — LARIN FE 1.5-30 TABLET: 1.5-30 | 28 days supply | Qty: 28 | Fill #0

## 2016-08-24 NOTE — Telephone Encounter (Signed)
Prescription has been printed for patient because she is completely out of her birthcontrol medication, [facility] next week. Per Dr. Abner GreenspanBlyth, she can get a printed copy of birth control pills and take to a pharmacy that will be cost friendly to her. Patient is aware she has to come to pick up her rx and she is to pay out of pocket.  PC

## 2016-08-24 NOTE — Telephone Encounter (Signed)
Patient states pharmacy never received MICROGESTIN FE 1.5/30 1.5-30 MG-MCG tablet and would like office to contact pharmacy directly   [facility] [id number] - [place], [place] - [address] [phone] (Phone) [phone] (Fax

## 2016-08-24 NOTE — Telephone Encounter (Signed)
Rx phoned to pharmacy.  

## 2016-08-31 NOTE — Progress Notes (Signed)
Cardiology Office Note   Date:  09/01/2016   ID:  Shelia GarnetShanca Bobe, DOB 12/29/1975, MRN [id number]  PCP:  Lemont Fillers'SULLIVAN,MELISSA S., NP  Cardiologist:  Dr. Eden EmmsNishan    Chief Complaint  Patient presents with  . Chest Pain      History of Present Illness: Shelia Mcgee is a 40 y.o. female who presents for chest pain also on visit with HTN  She has a hx of nuc study neg in 2016.   " Intermediate risk stress nuclear study: There is a medium sized, moderate intensity defect involving the mid and apical anterior wall and mid and basal inferolateral walls. There is some mild reversibility in the mid anterior and mid and basal inferolateral walls that could represent a small area of ischemia but also could be due to shifting breast and diaphragmatic attenuation. There was signifiant breast attenuation on RAW images.."  LV Ejection Fraction: 60%. LV Wall Motion: NL LV Function; NL Wall Motion  Today she relates chest pain that occurs twice a month or so a burning Lt ant chest pain.  Then turns into tightness. Lasts 10 min or so.  No associated symptoms of nausea, sob or diaphoresis. Comes on with rest or exercise.    BP has been high and toprol has been added.  BP improved from last office visit.  She is starting a diet, drinks at least 64 oz of water.  She will begin to exercise by wakjng once we eval chest pain.   She does snore but she is unsure if she is tired because she works two jobs so is tired frequently.     Past Medical History:  Diagnosis Date  . Hypertension     Past Surgical History:  Procedure Laterality Date  . HIP SURGERY Right 1992   States that bones were slipping. Stabilized it with 3 screws x 1 yr then had screws removed.     Current Outpatient Prescriptions  Medication Sig Dispense Refill  . amLODipine (NORVASC) 10 MG tablet TAKE 1 TABLET DAILY (DISCONTINUE PREVIOUS PRESCRIPTIONS FOR THIS MEDICATION) 90 tablet 1  . Cholecalciferol (VITAMIN D3) 3000 UNITS  TABS Take 1 tablet by mouth daily. 30 tablet   . meloxicam (MOBIC) 7.5 MG tablet TAKE 1 TABLET DAILY AS NEEDED FOR PAIN 90 tablet 0  . metoprolol succinate (TOPROL-XL) 25 MG 24 hr tablet Take 1 tablet (25 mg total) by mouth daily. 30 tablet 1  . MICROGESTIN FE 1.5/30 1.5-30 MG-MCG tablet TAKE 1 TABLET BY MOUTH DAILY 28 tablet 0  . Multiple Vitamins-Minerals (MULTIVITAMIN WITH MINERALS) tablet Take 1 tablet by mouth daily.     No current facility-administered medications for this visit.     Allergies:   Patient has no known allergies.    Social History:  The patient  reports that she has never smoked. She has never used smokeless tobacco. She reports that she does not drink alcohol or use drugs.   Family History:  The patient's family history includes Diabetes in her father; Heart attack in her maternal uncle; Hypertension in her father and mother.    ROS:  General:no colds or fevers, no weight changes Skin:no rashes or ulcers HEENT:no blurred vision, no congestion CV:see HPI PUL:see HPI GI:no diarrhea constipation or melena, no indigestion GU:no hematuria, no dysuria MS:no joint pain, no claudication Neuro:no syncope, no lightheadedness Endo:no diabetes, no thyroid disease  Wt Readings from Last 3 Encounters:  09/01/16 (!) 326 lb (147.9 kg)  08/22/16 (!) 325 lb 6.4 oz (147.6 kg)  05/30/16 (!) 323 lb 12.8 oz (146.9 kg)     PHYSICAL EXAM: VS:  BP (!) 133/91 (BP Location: Left Arm)   Pulse 74   Ht 5\' 7"  (1.702 m)   Wt (!) 326 lb (147.9 kg)   LMP 08/20/2016   BMI 51.06 kg/m  , BMI Body mass index is 51.06 kg/m. General:Pleasant affect, NAD Skin:Warm and dry, brisk capillary refill HEENT:normocephalic, sclera clear, mucus membranes moist Neck:supple, no JVD, no bruits  Heart:S1S2 RRR without murmur, gallup, rub or click Lungs:clear without rales, rhonchi, or wheezes ZOX:WRUEAbd:soft, non tender, + BS, do not palpate liver spleen or masses Ext:no lower ext edema, 2+ pedal pulses,  2+ radial pulses Neuro:alert and oriented X 3, MAE, follows commands, + facial symmetry    EKG:  EKG is ordered today. The ekg ordered today demonstrates SR no changes.  Normal EKG.    Recent Labs: 05/30/2016: ALT 15; BUN 15; Creatinine, Ser 0.99; Hemoglobin 13.5; Platelets 397.0; Potassium 4.2; Sodium 138; TSH 0.81    Lipid Panel    Component Value Date/Time   CHOL 153 05/30/2016 0937   TRIG 73.0 05/30/2016 0937   HDL 52.70 05/30/2016 0937   CHOLHDL 3 05/30/2016 0937   VLDL 14.6 05/30/2016 0937   LDLCALC 85 05/30/2016 0937       Other studies Reviewed: Additional studies/ records that were reviewed today include: previous nuc see above, .   ASSESSMENT AND PLAN:  1.  Chest pain may be due to HTN, but will proceed with exercise myoview to eval.  We have one from last year to compare to.  2. HTN improved with metoprolol but may need to increase, will see how BP does with exercise.    She will follow up with Dr. Eden EmmsNishan or myself for results.      Current medicines are reviewed with the patient today.  The patient Has no concerns regarding medicines.  The following changes have been made:  See above Labs/ tests ordered today include:see above  Disposition:   FU:  see above  Signed, Nada BoozerLaura Ingold, NP  09/01/2016 11:35 AM    [facility] [address], [place], [place]  [address]/ [address] [place], [place] Phone: [phone]; Fax: [phone]  [phone]

## 2016-09-01 ENCOUNTER — Ambulatory Visit (INDEPENDENT_AMBULATORY_CARE_PROVIDER_SITE_OTHER): Payer: BLUE CROSS/BLUE SHIELD | Admitting: Cardiology

## 2016-09-01 ENCOUNTER — Encounter: Payer: Self-pay | Admitting: Cardiology

## 2016-09-01 VITALS — BP 133/91 | HR 74 | Ht 67.0 in | Wt 326.0 lb

## 2016-09-01 DIAGNOSIS — R079 Chest pain, unspecified: Secondary | ICD-10-CM

## 2016-09-01 DIAGNOSIS — I10 Essential (primary) hypertension: Secondary | ICD-10-CM | POA: Diagnosis not present

## 2016-09-01 NOTE — Patient Instructions (Addendum)
Medication Instructions:  Your physician recommends that you continue on your current medications as directed. Please refer to the Current Medication list given to you today.   Labwork: None ordered  Testing/Procedures: Your physician has requested that you have en exercise stress myoview. For further information please visit [email or web address]. Please follow instruction sheet, as given.   Follow-Up: Your physician recommends that you schedule a follow-up appointment in:  1-2 WEEKS AFTER STRESS TEST IS DONE WITH DR. Eden EmmsNISHAN OR Nada BoozerLAURA INGOLD, NP   Any Other Special Instructions Will Be Listed Below (If Applicable).    Exercise Stress Electrocardiogram An exercise stress electrocardiogram is a test that is done to evaluate the blood supply to your heart. This test may also be called exercise stress electrocardiography. The test is done while you are walking on a treadmill. The goal of this test is to raise your heart rate. This test is done to find areas of poor blood flow to the heart by determining the extent of coronary artery disease (CAD). CAD is defined as narrowing in one or more heart (coronary) arteries of more than 70%. If you have an abnormal test result, this may mean that you are not getting adequate blood flow to your heart during exercise. Additional testing may be needed to understand why your test was abnormal. Tell a health care provider about:  Any allergies you have.  All medicines you are taking, including vitamins, herbs, eye drops, creams, and over-the-counter medicines.  Any problems you or family members have had with anesthetic medicines.  Any blood disorders you have.  Any surgeries you have had.  Any medical conditions you have.  Possibility of pregnancy, if this applies. What are the risks? Generally, this is a safe procedure. However, as with any procedure, complications can occur. Possible complications can include:  Pain or pressure in the  following areas:  Chest.  Jaw or neck.  Between your shoulder blades.  Radiating down your left arm.  Dizziness or light-headedness.  Shortness of breath.  Increased or irregular heartbeats.  Nausea or vomiting.  Heart attack (rare). What happens before the procedure?  Avoid all forms of caffeine 24 hours before your test or as directed by your health care provider. This includes coffee, tea (even decaffeinated tea), caffeinated sodas, chocolate, cocoa, and certain pain medicines.  Follow your health care provider's instructions regarding eating and drinking before the test.  Take your medicines as directed at regular times with water unless instructed otherwise. Exceptions may include:  If you have diabetes, ask how you are to take your insulin or pills. It is common to adjust insulin dosing the morning of the test.  If you are taking beta-blocker medicines, it is important to talk to your health care provider about these medicines well before the date of your test. Taking beta-blocker medicines may interfere with the test. In some cases, these medicines need to be changed or stopped 24 hours or more before the test.  If you wear a nitroglycerin patch, it may need to be removed prior to the test. Ask your health care provider if the patch should be removed before the test.  If you use an inhaler for any breathing condition, bring it with you to the test.  If you are an outpatient, bring a snack so you can eat right after the stress phase of the test.  Do not smoke for 4 hours prior to the test or as directed by your health care provider.  Do not apply lotions,  powders, creams, or oils on your chest prior to the test.  Wear loose-fitting clothes and comfortable shoes for the test. This test involves walking on a treadmill. What happens during the procedure?  Multiple patches (electrodes) will be put on your chest. If needed, small areas of your chest may have to be shaved  to get better contact with the electrodes. Once the electrodes are attached to your body, multiple wires will be attached to the electrodes and your heart rate will be monitored.  Your heart will be monitored both at rest and while exercising.  You will walk on a treadmill. The treadmill will be started at a slow pace. The treadmill speed and incline will gradually be increased to raise your heart rate. What happens after the procedure?  Your heart rate and blood pressure will be monitored after the test.  You may return to your normal schedule including diet, activities, and medicines, unless your health care provider tells you otherwise. This information is not intended to replace advice given to you by your health care provider. Make sure you discuss any questions you have with your health care provider. Document Released: 08/18/2000 Document Revised: 01/27/2016 Document Reviewed: 04/28/2013 Elsevier Interactive Patient Education  2017 [employer]

## 2016-09-05 ENCOUNTER — Ambulatory Visit (INDEPENDENT_AMBULATORY_CARE_PROVIDER_SITE_OTHER): Payer: BLUE CROSS/BLUE SHIELD | Admitting: Family

## 2016-09-05 VITALS — BP 138/100 | HR 74

## 2016-09-05 DIAGNOSIS — I10 Essential (primary) hypertension: Secondary | ICD-10-CM

## 2016-09-05 NOTE — Progress Notes (Signed)
Pre visit review using our clinic tool,if applicable. No additional management support is needed unless otherwise documented below in the visit note.   Patient in for BP check per order from M. Osullivan,N.P. dayted 08/22/16.  BP =138/100 Pulse = 74 Patient states her Cardiologist has ordered Stress test on January 15th and 16th. Depending on results, her Toprol may be increased to 50 mg.     Per M.Osullivan patient to continue medications as ordered and return for Office visit in 1 month. Appointment scheduled for October 06, 2016. Patient advised to keep appointment with Cardiology per Verdene LennertM. Osullivan. Patient agreed.

## 2016-09-05 NOTE — Progress Notes (Signed)
Noted and agree. 

## 2016-09-14 ENCOUNTER — Telehealth (HOSPITAL_COMMUNITY): Payer: Self-pay | Admitting: *Deleted

## 2016-09-14 NOTE — Telephone Encounter (Signed)
Patient given detailed instructions per Myocardial Perfusion Study Information Sheet for the test on 09/18/16. Patient notified to arrive 15 minutes early and that it is imperative to arrive on time for appointment to keep from having the test rescheduled.  If you need to cancel or reschedule your appointment, please call the office within 24 hours of your appointment. Failure to do so may result in a cancellation of your appointment, and a $50 no show fee. Patient verbalized understanding. Jaeven Wanzer Jacqueline    

## 2016-09-18 ENCOUNTER — Ambulatory Visit (HOSPITAL_COMMUNITY): Payer: BLUE CROSS/BLUE SHIELD | Attending: Cardiovascular Disease

## 2016-09-18 DIAGNOSIS — R079 Chest pain, unspecified: Secondary | ICD-10-CM | POA: Diagnosis not present

## 2016-09-18 MED ORDER — TECHNETIUM TC 99M TETROFOSMIN IV KIT
32.8000 | PACK | Freq: Once | INTRAVENOUS | Status: AC | PRN
  Administered 2016-09-18: 32.8 via INTRAVENOUS
  Filled 2016-09-18: qty 33

## 2016-09-19 ENCOUNTER — Ambulatory Visit (HOSPITAL_COMMUNITY): Payer: BLUE CROSS/BLUE SHIELD | Attending: Internal Medicine

## 2016-09-19 LAB — MYOCARDIAL PERFUSION IMAGING
CHL CUP MPHR: 182 {beats}/min
CHL CUP NUCLEAR SRS: 3
CHL CUP NUCLEAR SSS: 4
CHL CUP RESTING HR STRESS: 78 {beats}/min
CSEPEDS: 30 s
CSEPEW: 7 METS
CSEPPHR: 162 {beats}/min
Exercise duration (min): 5 min
LV dias vol: 113 mL (ref 46–106)
LVSYSVOL: 47 mL
NUC STRESS TID: 0.91
Percent HR: 89 %
RATE: 0.33
RPE: 18
SDS: 1

## 2016-09-19 MED ORDER — TECHNETIUM TC 99M TETROFOSMIN IV KIT
32.6000 | PACK | Freq: Once | INTRAVENOUS | Status: AC | PRN
  Administered 2016-09-19: 32.6 via INTRAVENOUS
  Filled 2016-09-19: qty 33

## 2016-09-23 IMAGING — DX DG KNEE COMPLETE 4+V*R*
4 series · 4 of 4 positions shown · non-contrast
Comparison: None.

CLINICAL DATA: Right knee pain.

EXAM:
RIGHT KNEE - COMPLETE 4+ VIEW

[knee ap]
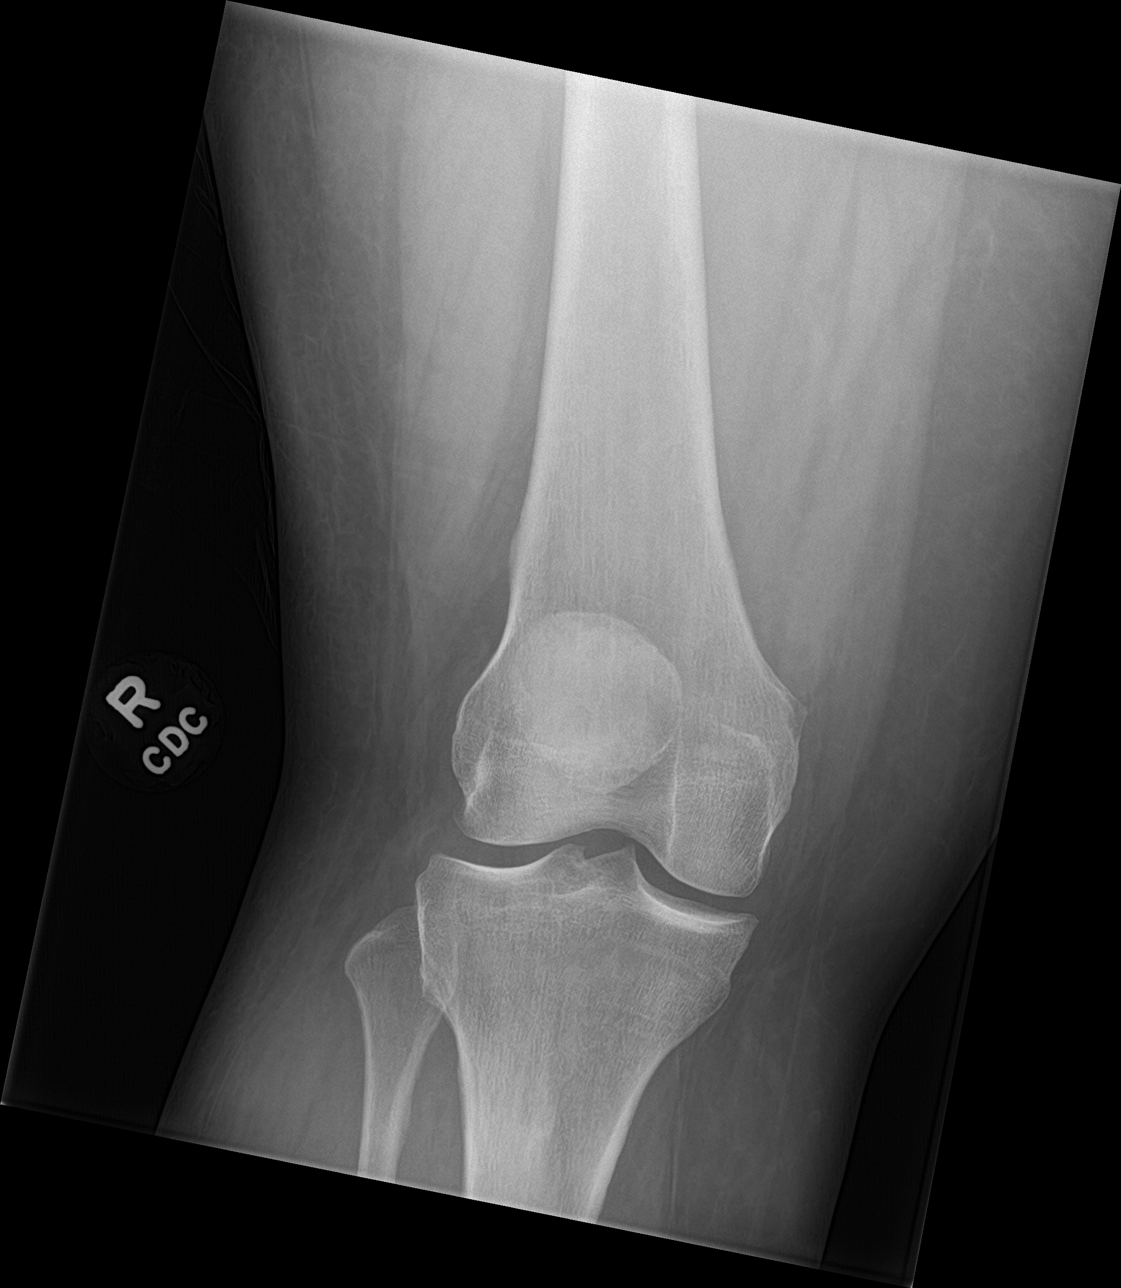

[knee lat]
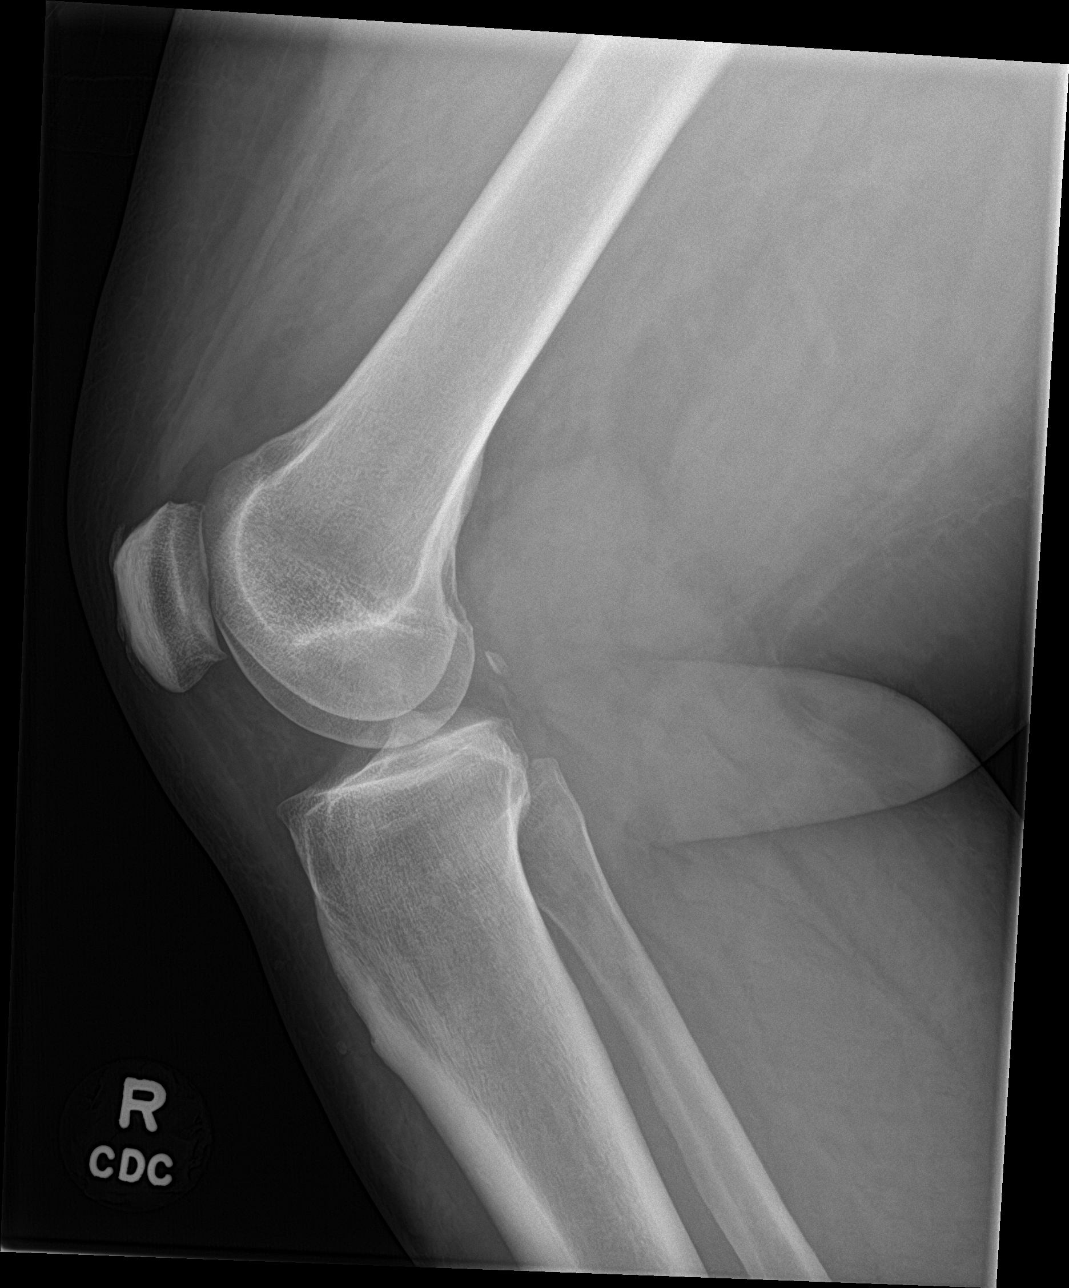

[knee obl (1 of 2)]
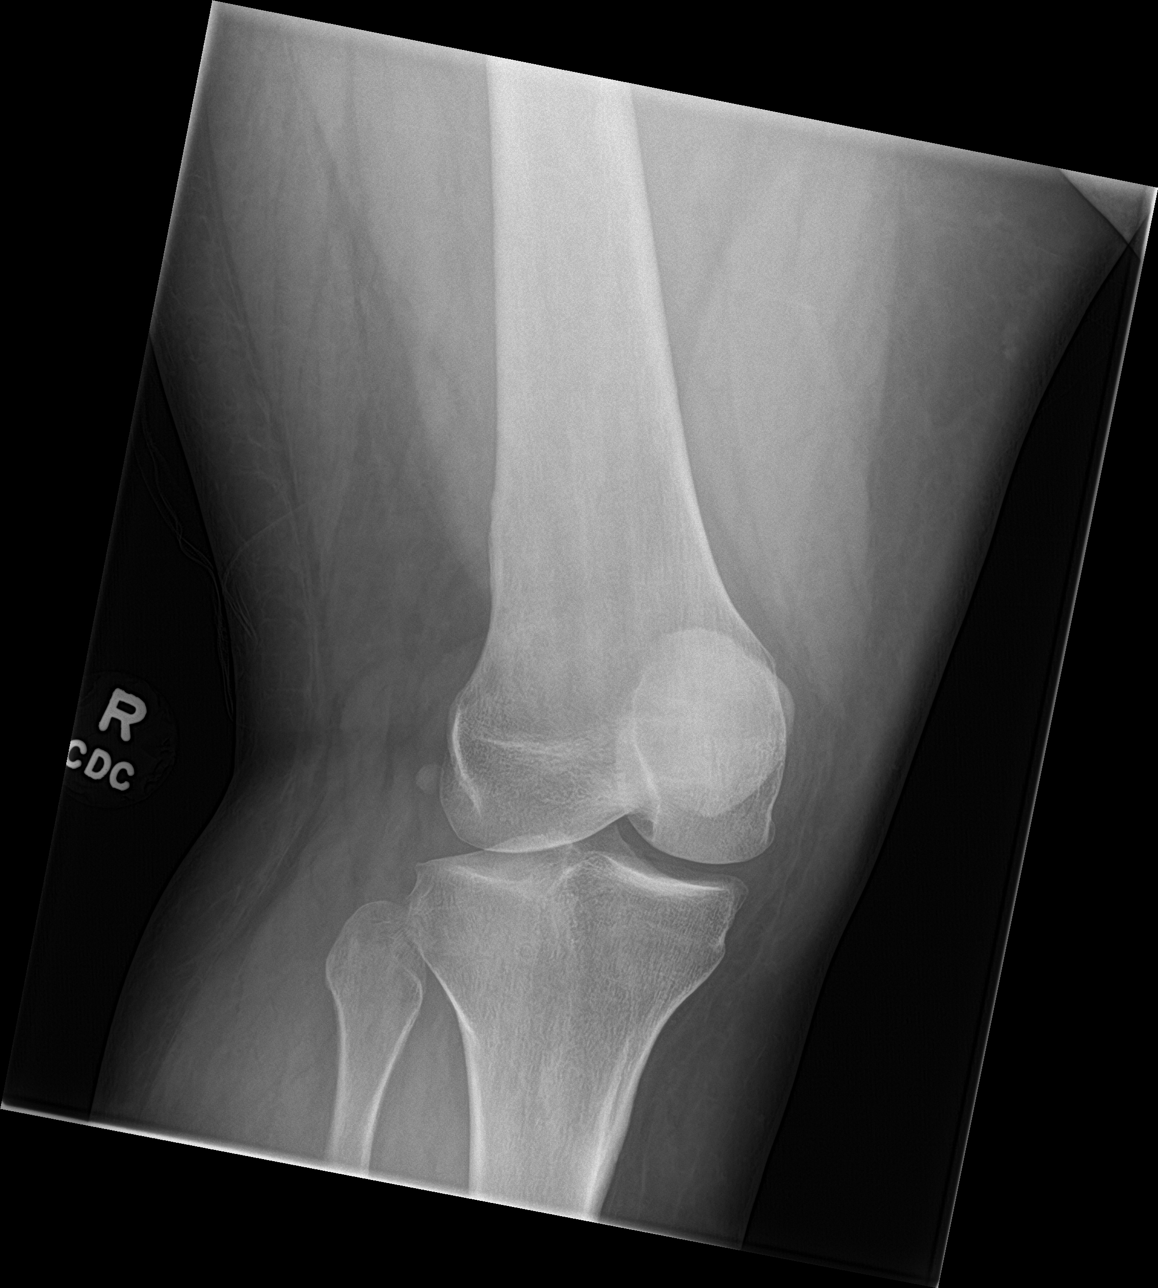

[knee obl (2 of 2)]
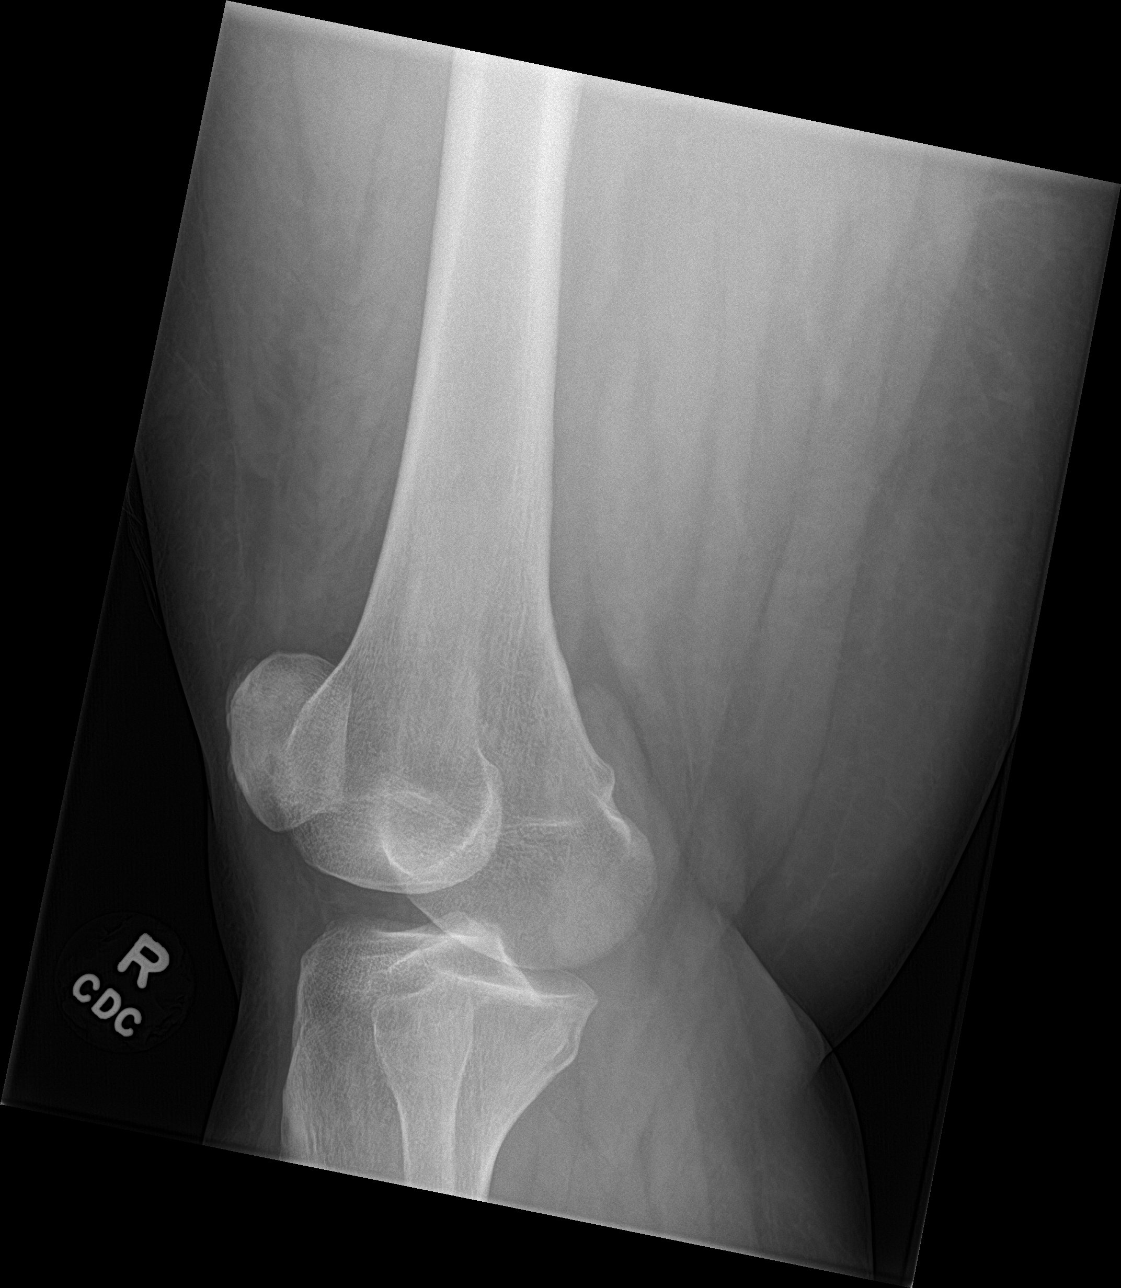

[4 of 4 positions shown; findings below may reference images not displayed]

FINDINGS: No fracture, dislocation, or joint effusion. No acute abnormalities.
IMPRESSION: Negative.

## 2016-09-27 ENCOUNTER — Telehealth: Payer: Self-pay | Admitting: Cardiology

## 2016-09-27 NOTE — Telephone Encounter (Signed)
Follow Up ° ° °Pt returning phone call from Jennifer. °

## 2016-09-28 NOTE — Telephone Encounter (Signed)
-----   Message from Leone BrandLaura R Ingold, NP sent at 09/21/2016 11:18 AM EST ----- Stress test was normal.  The pump action of the heart is normal as well.

## 2016-09-28 NOTE — Telephone Encounter (Signed)
Returned pts call and left another message for her to call back.  

## 2016-09-28 NOTE — Telephone Encounter (Signed)
Follow Up:; ° ° °Returning your call. °

## 2016-09-28 NOTE — Telephone Encounter (Signed)
Returned pts call and went over her stress test results. She verbalized appreciation and understanding. 

## 2016-10-05 ENCOUNTER — Ambulatory Visit (INDEPENDENT_AMBULATORY_CARE_PROVIDER_SITE_OTHER): Payer: BLUE CROSS/BLUE SHIELD | Admitting: Cardiology

## 2016-10-05 ENCOUNTER — Encounter: Payer: Self-pay | Admitting: Cardiology

## 2016-10-05 VITALS — BP 138/88 | HR 96 | Ht 67.0 in | Wt 326.1 lb

## 2016-10-05 DIAGNOSIS — I10 Essential (primary) hypertension: Secondary | ICD-10-CM | POA: Diagnosis not present

## 2016-10-05 DIAGNOSIS — R079 Chest pain, unspecified: Secondary | ICD-10-CM | POA: Diagnosis not present

## 2016-10-05 NOTE — Progress Notes (Signed)
Cardiology Office Note   Date:  10/05/2016   ID:  Shelia GarnetShanca Hally, DOB 07/13/1976, MRN [id number]  PCP:  Lemont Fillers'SULLIVAN,MELISSA S., NP  Cardiologist:  Dr. Eden EmmsNishan    Chief Complaint  Patient presents with  . Hypertension      History of Present Illness: Shelia Mcgee is a 41 y.o. female who presents for results of stress test and HTN.  BP was elevated with stress test.  Neg for ischemia.  She had been having chest pain as well.    She has a hx of nuc study neg in 2016.   "Intermediate risk stress nuclear study: There is a medium sized, moderate intensity defect involving the mid and apical anterior wall and mid and basal inferolateral walls. There is some mild reversibility in the mid anterior and mid and basal inferolateral walls that could represent a small area of ischemia but also could be due to shifting breast and diaphragmatic attenuation. There was signifiant breast attenuation on RAW images.."  LV Ejection Fraction: 60%. LV Wall Motion: NL LV Function; NL Wall Motion.  Today occ brief chest pain, may be Muscular skeletal or GI.  We discussed exercise and diet.  Her BP still slightly up Dr. Lendell CapriceSullivan may wish to increase the BB.  Answered questions.    Past Medical History:  Diagnosis Date  . Hypertension     Past Surgical History:  Procedure Laterality Date  . HIP SURGERY Right 1992   States that bones were slipping. Stabilized it with 3 screws x 1 yr then had screws removed.     Current Outpatient Prescriptions  Medication Sig Dispense Refill  . amLODipine (NORVASC) 10 MG tablet Take 10 mg by mouth daily.    . Cholecalciferol (VITAMIN D3) 3000 UNITS TABS Take 1 tablet by mouth daily. 30 tablet   . meloxicam (MOBIC) 7.5 MG tablet Take 7.5 mg by mouth daily as needed for pain.    . metoprolol succinate (TOPROL-XL) 25 MG 24 hr tablet Take 1 tablet (25 mg total) by mouth daily. 30 tablet 1  . MICROGESTIN FE 1.5/30 1.5-30 MG-MCG tablet TAKE 1 TABLET BY MOUTH DAILY 28  tablet 0  . Multiple Vitamins-Minerals (MULTIVITAMIN WITH MINERALS) tablet Take 1 tablet by mouth daily.     No current facility-administered medications for this visit.     Allergies:   Patient has no known allergies.    Social History:  The patient  reports that she has never smoked. She has never used smokeless tobacco. She reports that she does not drink alcohol or use drugs.   Family History:  The patient's family history includes Diabetes in her father; Heart attack in her maternal uncle; Hypertension in her father and mother.    ROS:  General:no colds or fevers, no weight changes CV:see HPI PUL:see HPI MS:no joint pain, no claudication Neuro:no syncope, no lightheadedness   Wt Readings from Last 3 Encounters:  10/05/16 (!) 326 lb 1.9 oz (147.9 kg)  09/18/16 (!) 326 lb (147.9 kg)  09/01/16 (!) 326 lb (147.9 kg)     PHYSICAL EXAM: VS:  BP 138/88   Pulse 96   Ht 5\' 7"  (1.702 m)   Wt (!) 326 lb 1.9 oz (147.9 kg)   BMI 51.08 kg/m  , BMI Body mass index is 51.08 kg/m. General:Pleasant affect, NAD Skin:Warm and dry, brisk capillary refill HEENT:normocephalic, sclera clear, mucus membranes moist Neck:supple, no JVD, no bruits  Heart:S1S2 RRR without murmur, gallup, rub or click Lungs:clear without rales, rhonchi, or  wheezes HYQ:MVHQ, non tender, + BS, do not palpate liver spleen or masses Ext:no lower ext edema, 2+ pedal pulses, 2+ radial pulses Neuro:alert and oriented, MAE, follows commands, + facial symmetry    EKG:  EKG is NOT ordered today.  Recent Labs: 05/30/2016: ALT 15; BUN 15; Creatinine, Ser 0.99; Hemoglobin 13.5; Platelets 397.0; Potassium 4.2; Sodium 138; TSH 0.81    Lipid Panel    Component Value Date/Time   CHOL 153 05/30/2016 0937   TRIG 73.0 05/30/2016 0937   HDL 52.70 05/30/2016 0937   CHOLHDL 3 05/30/2016 0937   VLDL 14.6 05/30/2016 0937   LDLCALC 85 05/30/2016 0937       Other studies Reviewed: Additional studies/ records that were  reviewed today include:.  Nuc study  Study Highlights    The patient walked for 5:30 of a Bruce protocol. Peak HR of 162 which is 89% predicted maximal HR. There were no ST or T wave changes to suggest ischemia.  The study is normal. there is no ischemia. no evidence of previous infarction .  This is a low risk study.  Nuclear stress EF: 58%. The left ventricular ejection fraction is normal (55-65%).     Pk BP  190/103.    ASSESSMENT AND PLAN:  1. Chest pain, non cardiac, nuc study normal.  Reassured.  Follow up PRN with Dr. Eden Emms  2.. HTN, may benefit from higher dose of BB.  Also recommend exercise eating healthy and wt loss.    Current medicines are reviewed with the patient today.  The patient Has no concerns regarding medicines.  The following changes have been made:  See above Labs/ tests ordered today include:see above  Disposition:   FU:  see above  Signed, Nada Boozer, NP  10/05/2016 3:47 PM    [facility] [address] [place], [place], [place]  [address]/ [address] [place], [place] Phone: [phone]; Fax: [phone]  [phone]

## 2016-10-05 NOTE — Patient Instructions (Signed)
Medication Instructions:  Your physician recommends that you continue on your current medications as directed. Please refer to the Current Medication list given to you today.   Labwork: -None  Testing/Procedures: -None  Follow-Up: As needed  Any Other Special Instructions Will Be Listed Below (If Applicable).     If you need a refill on your cardiac medications before your next appointment, please call your pharmacy.   

## 2016-10-06 ENCOUNTER — Encounter: Payer: Self-pay | Admitting: Family

## 2016-10-06 ENCOUNTER — Ambulatory Visit (INDEPENDENT_AMBULATORY_CARE_PROVIDER_SITE_OTHER): Payer: BLUE CROSS/BLUE SHIELD | Admitting: Family

## 2016-10-06 VITALS — BP 132/85 | HR 83 | Temp 98.3°F | Ht 67.0 in | Wt 326.4 lb

## 2016-10-06 DIAGNOSIS — I10 Essential (primary) hypertension: Secondary | ICD-10-CM | POA: Diagnosis not present

## 2016-10-06 LAB — BASIC METABOLIC PANEL
BUN: 17 mg/dL (ref 6–23)
CHLORIDE: 103 meq/L (ref 96–112)
CO2: 29 meq/L (ref 19–32)
Calcium: 9.2 mg/dL (ref 8.4–10.5)
Creatinine, Ser: 1.05 mg/dL (ref 0.40–1.20)
GFR: 75.27 mL/min (ref >60.00)
GLUCOSE: 84 mg/dL (ref 70–99)
POTASSIUM: 3.9 meq/L (ref 3.5–5.1)
SODIUM: 138 meq/L (ref 135–145)

## 2016-10-06 MED ORDER — AMLODIPINE BESYLATE 10 MG PO TABS: 10.0000 mg | ORAL_TABLET | Freq: Every day | ORAL | 1 refills | Status: DC

## 2016-10-06 MED ORDER — METOPROLOL SUCCINATE ER 25 MG PO TB24: 25.0000 mg | ORAL_TABLET | Freq: Every day | ORAL | 1 refills | Status: DC

## 2016-10-06 NOTE — Progress Notes (Signed)
Subjective:    Patient ID: Shelia Mcgee, female    DOB: 10/01/1975, 41 y.o.   MRN: [id number]  HPI  Ms. Shelia Mcgee is a 41 yr old female who presents today for follow up of her blood pressure. She is currently maintained on amlodipine and metoprolol.   BP Readings from Last 3 Encounters:  10/06/16 132/85  10/05/16 138/88  09/05/16 (!) 138/100     Review of Systems  Respiratory: Negative for shortness of breath.   Cardiovascular: Negative for chest pain and leg swelling.   See HPI  Past Medical History:  Diagnosis Date  . Hypertension      Social History   Social History  . Marital status: Single    Spouse name: N/A  . Number of children: N/A  . Years of education: N/A   Occupational History  . Not on file.   Social History Main Topics  . Smoking status: Never Smoker  . Smokeless tobacco: Never Used  . Alcohol use No  . Drug use: No  . Sexual activity: No   Other Topics Concern  . 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 HIP SURGERY Right 1992   States that bones were slipping. Stabilized it with 3 screws x 1 yr then had screws removed.    Family History  Problem Relation Age of Onset  . Hypertension Mother   . Hypertension Father   . Diabetes Father   . Heart attack Maternal Uncle     No Known Allergies  Current Outpatient Prescriptions on File Prior to Visit  Medication Sig Dispense Refill  . amLODipine (NORVASC) 10 MG tablet Take 10 mg by mouth daily.    . Cholecalciferol (VITAMIN D3) 3000 UNITS TABS Take 1 tablet by mouth daily. 30 tablet   . meloxicam (MOBIC) 7.5 MG tablet Take 7.5 mg by mouth daily as needed for pain.    . metoprolol succinate (TOPROL-XL) 25 MG 24 hr tablet Take 1 tablet (25 mg  total) by mouth daily. 30 tablet 1  . MICROGESTIN FE 1.5/30 1.5-30 MG-MCG tablet TAKE 1 TABLET BY MOUTH DAILY 28 tablet 0  . Multiple Vitamins-Minerals (MULTIVITAMIN WITH MINERALS) tablet Take 1 tablet by mouth daily.    . [DISCONTINUED] cetirizine (ZYRTEC) 10 MG tablet Take 1 tablet (10 mg total) by mouth daily. 30 tablet 11  . [DISCONTINUED] fluticasone (FLONASE) 50 MCG/ACT nasal spray Place 2 sprays into both nostrils daily. 16 g 3   No current facility-administered medications on file prior to visit.     BP 132/85 (BP Location: Right Arm, Cuff Size: Large)   Pulse 83   Temp 98.3 F (36.8 C) (Oral)   Ht 5\' 7"  (1.702 m)   Wt (!) 326 lb 6.4 oz (148.1 kg)   LMP 09/16/2016   SpO2 100%   BMI 51.12 kg/m       Objective:   Physical Exam  Constitutional: She is oriented to person, place, and time. She appears well-developed and well-nourished.  Cardiovascular: Normal rate, regular rhythm and normal heart sounds.   No murmur heard. Pulmonary/Chest: Effort normal and breath sounds normal. No respiratory distress. She has no wheezes.  Neurological: [name] and oriented to person, place, and  time.  Psychiatric: She has a normal mood and affect. Her behavior is normal. Judgment and thought content normal.          Assessment & Plan:

## 2016-10-06 NOTE — Assessment & Plan Note (Signed)
Stable on current meds, continue same, obtain follow up bmet.  

## 2016-10-06 NOTE — Progress Notes (Signed)
Pre visit review using our clinic review tool, if applicable. No additional management support is needed unless otherwise documented below in the visit note. 

## 2016-10-31 ENCOUNTER — Encounter: Payer: Self-pay | Admitting: Family Medicine

## 2016-10-31 ENCOUNTER — Ambulatory Visit (INDEPENDENT_AMBULATORY_CARE_PROVIDER_SITE_OTHER): Payer: BLUE CROSS/BLUE SHIELD | Admitting: Family Medicine

## 2016-10-31 VITALS — BP 130/88 | HR 77 | Temp 97.9°F | Resp 16 | Ht 67.0 in | Wt 330.6 lb

## 2016-10-31 DIAGNOSIS — M5441 Lumbago with sciatica, right side: Secondary | ICD-10-CM

## 2016-10-31 MED ORDER — MELOXICAM 7.5 MG PO TABS
ORAL_TABLET | ORAL | 1 refills | Status: DC
Start: 2016-10-31 — End: 2016-11-01

## 2016-10-31 MED ORDER — CYCLOBENZAPRINE HCL 10 MG PO TABS: 10.0000 mg | ORAL_TABLET | Freq: Three times a day (TID) | ORAL | 0 refills | Status: DC | PRN

## 2016-10-31 NOTE — Patient Instructions (Signed)

## 2016-10-31 NOTE — Progress Notes (Signed)
Pre visit review using our clinic review tool, if applicable. No additional management support is needed unless otherwise documented below in the visit note. 

## 2016-10-31 NOTE — Progress Notes (Signed)
Patient ID: Shelia Mcgee, female   DOB: 02/06/1976, 41 y.o.   MRN: [id number]     Subjective:    Patient ID: Shelia Mcgee, female    DOB: 1976/05/24, 41 y.o.   MRN: [id number]  Chief Complaint  Patient presents with  . Fall    on saturday    Fall  Incident onset: Saturday 10/28/16. Fall occurred: slipped on puddle of water. She landed on hard floor. The pain is present in the back and right hip. The pain is at a severity of 7/10. The pain is moderate. The symptoms are aggravated by sitting and movement. Associated symptoms include tingling. Pertinent negatives include no fever. She has tried acetaminophen and NSAID for the symptoms. The treatment provided mild relief.    Patient is in today for slip and fall on Saturday at [facility]. Pt c/o pain in R mid low back and down leg to knee.  Pain with walking , sitting ,  No pain with steps.  No relief with ibuprofen or tylenol.    + muscle spams  Past Medical History:  Diagnosis Date  . Hypertension     Past Surgical History:  Procedure Laterality Date  . HIP SURGERY Right 1992   States that bones were slipping. Stabilized it with 3 screws x 1 yr then had screws removed.    Family History  Problem Relation Age of Onset  . Hypertension Mother   . Hypertension Father   . Diabetes Father   . Heart attack Maternal Uncle     Social History   Social History  . Marital status: Single    Spouse name: N/A  . Number of children: N/A  . Years of education: N/A   Occupational History  . Not on file.   Social History Main Topics  . Smoking status: Never Smoker  . Smokeless tobacco: Never Used  . Alcohol use No  . Drug use: No  . Sexual activity: No   Other Topics Concern  . 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 amLODipine (NORVASC) 10 MG tablet Take 1 tablet (10 mg total) by mouth daily. 90 tablet 1  . Cholecalciferol (VITAMIN D3) 3000 UNITS TABS Take 1 tablet by mouth daily. 30 tablet   . metoprolol succinate (TOPROL-XL) 25 MG 24 hr tablet Take 1 tablet (25 mg total) by mouth daily. 90 tablet 1  . MICROGESTIN FE 1.5/30 1.5-30 MG-MCG tablet TAKE 1 TABLET BY MOUTH DAILY 28 tablet 0  . Multiple Vitamins-Minerals (MULTIVITAMIN WITH MINERALS) tablet Take 1 tablet by mouth daily.    . meloxicam (MOBIC) 7.5 MG tablet Take 7.5 mg by mouth daily as needed for pain.     No facility-administered medications prior to visit.     No Known Allergies  Review of Systems  Constitutional: Negative for chills, fever and malaise/fatigue.  Musculoskeletal: Positive for back pain and falls.  Neurological: Positive for tingling. Negative for sensory change, speech change, focal weakness and weakness.       Objective:    Physical Exam  Constitutional: She appears well-developed and well-nourished. No distress.  Musculoskeletal: Normal range of motion. She exhibits tenderness. She exhibits no edema.  Arms: Neurological: She has normal reflexes. She displays normal reflexes. No cranial nerve deficit. She exhibits normal muscle tone. Coordination normal.  + SLR 45 degress on R  Normal SLR L   Nursing note and vitals reviewed.   BP 130/88 (BP Location: Left Arm, Cuff Size: Normal)   Pulse 77   Temp 97.9 F (36.6 C) (Oral)   Resp 16   Ht 5\' 7"  (1.702 m)   Wt (!) 330 lb 9.6 oz (150 kg)   LMP 10/14/2016   SpO2 99%   BMI 51.78 kg/m  Wt Readings from Last 3 Encounters:  10/31/16 (!) 330 lb 9.6 oz (150 kg)  10/06/16 (!) 326 lb 6.4 oz (148.1 kg)  10/05/16 (!) 326 lb 1.9 oz (147.9 kg)     Lab Results  Component Value Date   WBC 6.3 05/30/2016   HGB 13.5 05/30/2016   HCT 40.8 05/30/2016   PLT 397.0 05/30/2016   GLUCOSE 84 10/06/2016    CHOL 153 05/30/2016   TRIG 73.0 05/30/2016   HDL 52.70 05/30/2016   LDLCALC 85 05/30/2016   ALT 15 05/30/2016   AST 19 05/30/2016   NA 138 10/06/2016   K 3.9 10/06/2016   CL 103 10/06/2016   CREATININE 1.05 10/06/2016   BUN 17 10/06/2016   CO2 29 10/06/2016   TSH 0.81 05/30/2016   INR 0.97 01/13/2014    Lab Results  Component Value Date   TSH 0.81 05/30/2016   Lab Results  Component Value Date   WBC 6.3 05/30/2016   HGB 13.5 05/30/2016   HCT 40.8 05/30/2016   MCV 80.2 05/30/2016   PLT 397.0 05/30/2016   Lab Results  Component Value Date   NA 138 10/06/2016   K 3.9 10/06/2016   CO2 29 10/06/2016   GLUCOSE 84 10/06/2016   BUN 17 10/06/2016   CREATININE 1.05 10/06/2016   BILITOT 0.5 05/30/2016   ALKPHOS 44 05/30/2016   AST 19 05/30/2016   ALT 15 05/30/2016   PROT 7.5 05/30/2016   ALBUMIN 3.8 05/30/2016   CALCIUM 9.2 10/06/2016   GFR 75.27 10/06/2016   Lab Results  Component Value Date   CHOL 153 05/30/2016   Lab Results  Component Value Date   HDL 52.70 05/30/2016   Lab Results  Component Value Date   LDLCALC 85 05/30/2016   Lab Results  Component Value Date   TRIG 73.0 05/30/2016   Lab Results  Component Value Date   CHOLHDL 3 05/30/2016   No results found for: HGBA1C     Assessment & Plan:   Problem List Items Addressed This Visit    None    Visit Diagnoses    Acute right-sided low back pain with right-sided sciatica    -  Primary   Relevant Medications   meloxicam (MOBIC) 7.5 MG tablet   cyclobenzaprine (FLEXERIL) 10 MG tablet           Rest --- rto 2 week or sooner prn No lifting > 10 lbs As long as you are able to change position-- sit and stand --- you should be ok-- let us know if you are having trouble at work Pt works and [employer]  I have changed Shelia Mcgee's meloxicam. I am also having her start on cyclobenzaprine. Additionally, I am having her maintain her multivitamin with minerals, Vitamin D3, MICROGESTIN FE  1.5/30, amLODipine, and metoprolol succinate.  Meds ordered this encounter  Medications  . meloxicam (MOBIC) 7.5 MG tablet  Sig: 1 -2 po qd prn pain    Dispense:  60 tablet    Refill:  1  . cyclobenzaprine (FLEXERIL) 10 MG tablet    Sig: Take 1 tablet (10 mg total) by mouth 3 (three) times daily as needed for muscle spasms.    Dispense:  30 tablet    Refill:  0    CMA served as scribe during this visit. History, Physical and Plan performed by medical provider. Documentation and orders reviewed and attested to.  Donato Schultz, DO

## 2016-11-01 ENCOUNTER — Telehealth: Payer: Self-pay | Admitting: *Deleted

## 2016-11-01 ENCOUNTER — Telehealth: Payer: Self-pay | Admitting: Family

## 2016-11-01 MED ORDER — NAPROXEN 500 MG PO TABS: 500.0000 mg | ORAL_TABLET | Freq: Two times a day (BID) | ORAL | 0 refills | Status: DC

## 2016-11-01 NOTE — Telephone Encounter (Signed)
Notified pt and she voices understanding. [facility].

## 2016-11-01 NOTE — Telephone Encounter (Signed)
Relation to ZO:XWRUpt:self Call back [phone]   Reason for call:  Patient requesting Dr. Note excusing her from work from now returning back to work 11/06/16 due to patient falling, patient states her second job requires her to stand, please advise   [employer] # [phone] ext. [phone] work   Please fax letter  #[phone]

## 2016-11-01 NOTE — Telephone Encounter (Signed)
Patient was seen yesterday for a fall and was prescribe meloxicam.  It is not covered by ins.  Can we change to something else?

## 2016-11-01 NOTE — Telephone Encounter (Signed)
D/C that, Naproxen 500 mg BID.

## 2016-11-02 ENCOUNTER — Encounter: Payer: Self-pay | Admitting: Family

## 2016-11-02 ENCOUNTER — Encounter: Payer: Self-pay | Admitting: Family Medicine

## 2016-11-02 NOTE — Telephone Encounter (Signed)
Ok to give note 

## 2016-11-02 NOTE — Telephone Encounter (Signed)
Note printed and patient informed by mychart. Mailed to her home Called to inform her, but had to leave a detailed message.

## 2016-11-15 ENCOUNTER — Ambulatory Visit: Payer: BLUE CROSS/BLUE SHIELD | Admitting: Family

## 2016-11-17 ENCOUNTER — Ambulatory Visit (INDEPENDENT_AMBULATORY_CARE_PROVIDER_SITE_OTHER): Payer: BLUE CROSS/BLUE SHIELD | Admitting: Family

## 2016-11-17 ENCOUNTER — Encounter: Payer: Self-pay | Admitting: Family

## 2016-11-17 VITALS — BP 115/74 | HR 83 | Temp 98.2°F | Resp 16 | Ht 67.0 in | Wt 328.4 lb

## 2016-11-17 DIAGNOSIS — M545 Low back pain: Secondary | ICD-10-CM | POA: Diagnosis not present

## 2016-11-17 NOTE — Progress Notes (Signed)
Pre visit review using our clinic review tool, if applicable. No additional management support is needed unless otherwise documented below in the visit note. 
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
## 2016-11-17 NOTE — Patient Instructions (Signed)
Use naproxen as needed. For mild pain, use tylenol. Call if new/worsening back/hip pain.

## 2016-12-11 ENCOUNTER — Encounter: Payer: Self-pay | Admitting: Family

## 2016-12-11 ENCOUNTER — Ambulatory Visit (INDEPENDENT_AMBULATORY_CARE_PROVIDER_SITE_OTHER): Payer: BLUE CROSS/BLUE SHIELD | Admitting: Family

## 2016-12-11 VITALS — BP 130/92 | HR 88 | Temp 98.1°F | Ht 67.0 in | Wt 324.6 lb

## 2016-12-11 DIAGNOSIS — M542 Cervicalgia: Secondary | ICD-10-CM

## 2016-12-11 NOTE — Patient Instructions (Signed)
Call if symptoms worsen or if they do not continue to improve.
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
## 2016-12-11 NOTE — Progress Notes (Signed)
Pre visit review using our clinic review tool, if applicable. No additional management support is needed unless otherwise documented below in the visit note. 

## 2016-12-19 ENCOUNTER — Ambulatory Visit (INDEPENDENT_AMBULATORY_CARE_PROVIDER_SITE_OTHER): Payer: BLUE CROSS/BLUE SHIELD | Admitting: Family Medicine

## 2016-12-19 ENCOUNTER — Encounter: Payer: Self-pay | Admitting: Family Medicine

## 2016-12-19 VITALS — BP 135/90 | HR 72 | Temp 98.4°F | Resp 20 | Wt 325.8 lb

## 2016-12-19 DIAGNOSIS — T148XXA Other injury of unspecified body region, initial encounter: Secondary | ICD-10-CM | POA: Diagnosis not present

## 2016-12-19 DIAGNOSIS — M79675 Pain in left toe(s): Secondary | ICD-10-CM
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
## 2016-12-25 ENCOUNTER — Ambulatory Visit: Payer: Self-pay | Admitting: Family

## 2016-12-29 NOTE — Addendum Note (Signed)
Addended by: Sandford Craze on: 12/29/2016 04:23 PM   Modules accepted: Level of Service

## 2017-03-14 ENCOUNTER — Encounter: Payer: Self-pay | Admitting: Family

## 2017-04-06 ENCOUNTER — Ambulatory Visit: Payer: BLUE CROSS/BLUE SHIELD | Admitting: Family

## 2017-04-09 ENCOUNTER — Ambulatory Visit (INDEPENDENT_AMBULATORY_CARE_PROVIDER_SITE_OTHER): Payer: BLUE CROSS/BLUE SHIELD | Admitting: Family

## 2017-04-09 ENCOUNTER — Encounter: Payer: Self-pay | Admitting: Family

## 2017-04-09 VITALS — BP 146/88 | HR 72 | Temp 98.3°F | Resp 20 | Ht 67.0 in | Wt 334.8 lb

## 2017-04-09 DIAGNOSIS — I10 Essential (primary) hypertension: Secondary | ICD-10-CM

## 2017-04-09 NOTE — Patient Instructions (Signed)
Please take medication daily.  Continue to monitor your blood pressure at home. Call if BP is running >140/90. Follow up as scheduled.

## 2017-04-09 NOTE — Progress Notes (Signed)
Subjective:    Patient ID: Shelia Mcgee, female    DOB: [birth year], 41 y.o.   MRN: [id number]  HPI   Shelia Mcgee is a 41 yr old female who presents today for follow up of her hypertension. Current medications include amlodipine 10mg  and toprol xl 25mg .  Reports home BP about 125-133/83-92. Home meter in office reads 133/89. Forgot to take her medication today.  s BP Readings from Last 3 Encounters:  04/09/17 (!) 146/88  12/19/16 135/90  12/11/16 (!) 130/92   Reports that she is scheduled to see ortho re: Right hip pain. She is scheduled to see Earna Coder.   Review of Systems [id number] HPI  Past Medical History:  Diagnosis Date  . Hypertension      Social History   Social History  . Marital status: Single    Spouse name: N/A  . Number of children: N/A  . Years of education: N/A   Occupational History  . Not on file.   Social History Main Topics  . Smoking status: Never Smoker  . Smokeless tobacco: Never Used  . Alcohol use No  . Drug use: No  . Sexual activity: No   Other Topics Concern  . 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 HIP SURGERY Right 1992   States that bones were slipping. Stabilized it with 3 screws x 1 yr then had screws removed.    Family History  Problem Relation Age of Onset  . Hypertension Mother   . Hypertension Father   . Diabetes Father   . Heart attack Maternal Uncle     No Known Allergies  Current Outpatient Prescriptions on File Prior to Visit  Medication Sig Dispense Refill  . amLODipine (NORVASC) 10 MG tablet Take 1 tablet (10 mg total) by mouth daily. 90 tablet 1  . Cholecalciferol (VITAMIN D3) 3000 UNITS TABS Take 1 tablet by mouth daily. 30 tablet   . cyclobenzaprine  (FLEXERIL) 10 MG tablet Take 1 tablet (10 mg total) by mouth 3 (three) times daily as needed for muscle spasms. 30 tablet 0  . meloxicam (MOBIC) 7.5 MG tablet Take 1 tablet by mouth as needed.    . metoprolol succinate (TOPROL-XL) 25 MG 24 hr tablet Take 1 tablet (25 mg total) by mouth daily. 90 tablet 1  . MICROGESTIN FE 1.5/30 1.5-30 MG-MCG tablet TAKE 1 TABLET BY MOUTH DAILY 28 tablet 0  . Multiple Vitamins-Minerals (MULTIVITAMIN WITH MINERALS) tablet Take 1 tablet by mouth daily.    . [DISCONTINUED] cetirizine (ZYRTEC) 10 MG tablet Take 1 tablet (10 mg total) by mouth daily. 30 tablet 11  . [DISCONTINUED] fluticasone (FLONASE) 50 MCG/ACT nasal spray Place 2 sprays into both nostrils daily. 16 g 3   No current facility-administered medications on file prior to visit.     BP (!) 146/88 (BP Location: Right Arm) Comment (Cuff Size): thigh  Pulse 72   Temp 98.3 F (36.8 C) (Oral)   Resp 20   Ht 5\' 7"  (1.702 m)   Wt (!) 334 lb 12.8 oz (151.9 kg)   LMP 03/31/2017   SpO2 100%   BMI 52.44 kg/m       Objective:   Physical Exam  Constitutional:  She appears well-developed and well-nourished.  Cardiovascular: Normal rate, regular rhythm and normal heart sounds.   No murmur heard. Pulmonary/Chest: Effort normal and breath sounds normal. No respiratory distress. She has no wheezes.  Psychiatric: She has a normal mood and affect. Her behavior is normal. Judgment and thought content normal.          Assessment & Plan:  HTN- uncontrolled. Did not take AM meds today.   Advised pt as follows:  Please take medication daily.  Continue to monitor your blood pressure at home. Call if BP is running >140/90. Follow up as scheduled.

## 2017-04-12 ENCOUNTER — Ambulatory Visit (INDEPENDENT_AMBULATORY_CARE_PROVIDER_SITE_OTHER): Payer: BLUE CROSS/BLUE SHIELD

## 2017-04-12 ENCOUNTER — Ambulatory Visit (INDEPENDENT_AMBULATORY_CARE_PROVIDER_SITE_OTHER): Payer: BLUE CROSS/BLUE SHIELD | Admitting: Orthopaedic Surgery

## 2017-04-12 ENCOUNTER — Encounter (INDEPENDENT_AMBULATORY_CARE_PROVIDER_SITE_OTHER): Payer: Self-pay | Admitting: Orthopaedic Surgery

## 2017-04-12 DIAGNOSIS — M25551 Pain in right hip: Secondary | ICD-10-CM | POA: Diagnosis not present

## 2017-04-12 NOTE — Progress Notes (Signed)
Office Visit Note   Patient: Shelia Mcgee           Date of Birth: 01/04/1976           MRN: [id number] Visit Date: 04/12/2017              Requested by: Sandford Craze'Sullivan, Melissa, NP [id number] [place], [place] [id number] PCP: Sandford Craze'Sullivan, Melissa, NP   Assessment & Plan: Visit Diagnoses:  1. Pain in right hip     Plan: Overall impression is mild degenerative joint disease and morbid obesity. Recommend weight loss for which we made a referral to Dr. Dalbert GarnetBeasley for. Right hip cortisone injection for Dr. Alvester MorinNewton. Follow-up as needed.  Follow-Up Instructions: Return if symptoms worsen or fail to improve.   Orders:  Orders Placed This Encounter  Procedures  . XR HIP UNILAT W OR W/O PELVIS 2-3 VIEWS RIGHT   No orders of the defined types were placed in this encounter.     Procedures: No procedures performed   Clinical Data: No additional findings.   Subjective: Chief Complaint  Patient presents with  . Right Hip - Pain    Patient is a healthy 41 year old female comes in with progressively worsening right hip pain with some radiation into her thigh for several years. She denies any injuries. She denies any groin pain. Denies any numbness and tingling. The pain is worse with activity.    Review of Systems  Constitutional: Negative.   HENT: Negative.   Eyes: Negative.   Respiratory: Negative.   Cardiovascular: Negative.   Endocrine: Negative.   Musculoskeletal: Negative.   Neurological: Negative.   Hematological: Negative.   Psychiatric/Behavioral: Negative.   All other systems reviewed and are negative.    Objective: Vital Signs: LMP 03/31/2017   Physical Exam  Constitutional: She is oriented to person, place, and time. She appears well-developed and well-nourished.  HENT:  Head: Normocephalic and atraumatic.  Eyes: EOM are normal.  Neck: Neck supple.  Pulmonary/Chest: Effort normal.  Abdominal: Soft.  Neurological: [name] and oriented to  person, place, and time.  Skin: Skin is warm. Capillary refill takes less than 2 seconds.  Psychiatric: She has a normal mood and affect. Her behavior is normal. Judgment and thought content normal.  Nursing note and vitals reviewed.   Ortho Exam Right hip exam shows mild pain with rotation. Positive Stinchfield sign. No sciatic tension signs. Lateral hip is only mildly tender. Specialty Comments:  No specialty comments available.  Imaging: Xr Hip Unilat W Or W/o Pelvis 2-3 Views Right  Result Date: 04/12/2017 Mild degenerative joint disease of hips    PMFS History: Patient Active Problem List   Diagnosis Date Noted  . Arthritis 11/08/2015  . Vitamin D deficiency 05/31/2015  . Allergic rhinitis 03/10/2015  . Contraceptive management 01/22/2015  . Routine general medical examination at a health care facility 05/22/2014  . Vaginitis 04/29/2014  . HTN (hypertension) 04/29/2014   Past Medical History:  Diagnosis Date  . Hypertension     Family History  Problem Relation Age of Onset  . Hypertension Mother   . Hypertension Father   . Diabetes Father   . Heart attack Maternal Uncle     Past Surgical History:  Procedure Laterality Date  . HIP SURGERY Right 1992   States that bones were slipping. Stabilized it with 3 screws x 1 yr then had screws removed.   Social History   Occupational History  . Not on file.  Social History Main Topics  . Smoking status: Never Smoker  . Smokeless tobacco: Never Used  . Alcohol use No  . Drug use: No  . Sexual activity: No

## 2017-04-12 NOTE — Addendum Note (Signed)
Addended by: Albertina ParrGARCIA, Koraima Albertsen on: 04/12/2017 11:09 AM   Modules accepted: Orders

## 2017-04-27 ENCOUNTER — Encounter (INDEPENDENT_AMBULATORY_CARE_PROVIDER_SITE_OTHER): Payer: Self-pay

## 2017-05-02 ENCOUNTER — Ambulatory Visit (INDEPENDENT_AMBULATORY_CARE_PROVIDER_SITE_OTHER): Payer: BLUE CROSS/BLUE SHIELD | Admitting: Physician Assistant

## 2017-05-06 ENCOUNTER — Other Ambulatory Visit: Payer: Self-pay | Admitting: Family

## 2017-05-25 ENCOUNTER — Other Ambulatory Visit: Payer: Self-pay | Admitting: Family

## 2017-06-01 ENCOUNTER — Encounter: Payer: Self-pay | Admitting: Family

## 2017-06-01 ENCOUNTER — Ambulatory Visit (INDEPENDENT_AMBULATORY_CARE_PROVIDER_SITE_OTHER): Payer: BLUE CROSS/BLUE SHIELD | Admitting: Family

## 2017-06-01 VITALS — BP 134/88 | HR 75 | Temp 98.2°F | Resp 18 | Ht 67.0 in | Wt 337.8 lb

## 2017-06-01 DIAGNOSIS — Z0000 Encounter for general adult medical examination without abnormal findings: Secondary | ICD-10-CM | POA: Diagnosis not present

## 2017-06-01 DIAGNOSIS — Z23 Encounter for immunization: Secondary | ICD-10-CM

## 2017-06-01 LAB — BASIC METABOLIC PANEL
BUN: 11 mg/dL (ref 6–23)
CHLORIDE: 99 meq/L (ref 96–112)
CO2: 28 mEq/L (ref 19–32)
CREATININE: 0.99 mg/dL (ref 0.40–1.20)
Calcium: 9.2 mg/dL (ref 8.4–10.5)
GFR: 80.28 mL/min (ref >60.00)
Glucose, Bld: 84 mg/dL (ref 70–99)
POTASSIUM: 3.8 meq/L (ref 3.5–5.1)
Sodium: 134 mEq/L — ABNORMAL LOW (ref 135–145)

## 2017-06-01 LAB — HEPATIC FUNCTION PANEL
ALT: 12 U/L (ref 0–35)
AST: 14 U/L (ref 0–37)
Albumin: 3.8 g/dL (ref 3.5–5.2)
Alkaline Phosphatase: 40 U/L (ref 39–117)
BILIRUBIN DIRECT: 0 mg/dL (ref 0.0–0.3)
TOTAL PROTEIN: 7 g/dL (ref 6.0–8.3)
Total Bilirubin: 0.5 mg/dL (ref 0.2–1.2)

## 2017-06-01 LAB — URINALYSIS, ROUTINE W REFLEX MICROSCOPIC
Bilirubin Urine: NEGATIVE
Hgb urine dipstick: NEGATIVE
KETONES UR: NEGATIVE
Leukocytes, UA: NEGATIVE
Nitrite: NEGATIVE
PH: 6 (ref 5.0–8.0)
RBC / HPF: NONE SEEN (ref 0–?)
SPECIFIC GRAVITY, URINE: 1.015 (ref 1.000–1.030)
TOTAL PROTEIN, URINE-UPE24: NEGATIVE
UROBILINOGEN UA: 0.2 (ref 0.0–1.0)
Urine Glucose: NEGATIVE

## 2017-06-01 LAB — CBC WITH DIFFERENTIAL/PLATELET
BASOS PCT: 0.6 % (ref 0.0–3.0)
Basophils Absolute: 0 10*3/uL (ref 0.0–0.1)
EOS ABS: 0 10*3/uL (ref 0.0–0.7)
EOS PCT: 0.7 % (ref 0.0–5.0)
HEMATOCRIT: 40.5 % (ref 36.0–46.0)
HEMOGLOBIN: 13.1 g/dL (ref 12.0–15.0)
LYMPHS PCT: 36.1 % (ref 12.0–46.0)
Lymphs Abs: 2.6 10*3/uL (ref 0.7–4.0)
MCHC: 32.4 g/dL (ref 30.0–36.0)
MCV: 81.9 fl (ref 78.0–100.0)
Monocytes Absolute: 0.5 10*3/uL (ref 0.1–1.0)
Monocytes Relative: 6.3 % (ref 3.0–12.0)
Neutro Abs: 4.1 10*3/uL (ref 1.4–7.7)
Neutrophils Relative %: 56.3 % (ref 43.0–77.0)
Platelets: 378 10*3/uL (ref 150.0–400.0)
RBC: 4.94 Mil/uL (ref 3.87–5.11)
RDW: 15.6 % — AB (ref 11.5–15.5)
WBC: 7.3 10*3/uL (ref 4.0–10.5)

## 2017-06-01 LAB — LIPID PANEL
CHOL/HDL RATIO: 3
Cholesterol: 164 mg/dL (ref 0–200)
HDL: 52.2 mg/dL (ref >39.00)
LDL Cholesterol: 96 mg/dL (ref 0–99)
NonHDL: 111.81
TRIGLYCERIDES: 79 mg/dL (ref 0.0–149.0)
VLDL: 15.8 mg/dL (ref 0.0–40.0)

## 2017-06-01 LAB — TSH: TSH: 0.87 u[IU]/mL (ref 0.35–4.50)

## 2017-06-01 NOTE — Addendum Note (Signed)
Addended by: Mervin Kung A on: 06/01/2017 04:43 PM   Modules accepted: Orders

## 2017-06-01 NOTE — Progress Notes (Signed)
Subjective:    Patient ID: Shelia Mcgee, female    DOB: 1976/06/03, 41 y.o.   MRN: [id number]  HPI  Shelia Mcgee is a 41 yr old female who presents today for complete physical.  Immunizations:  tdap 2012, flu shot today Diet: reports that she is a stress eater.   Exercise:works 7A to 7P 5 days a week Wt Readings from Last 3 Encounters:  06/01/17 (!) 337 lb 12.8 oz (153.2 kg)  04/09/17 (!) 334 lb 12.8 oz (151.9 kg)  12/19/16 (!) 325 lb 12 oz (147.8 kg)  Pap Smear: 9/16 Mammogram: will begin at 42 Vision: up to date Dental:  Up to date    Review of Systems  Constitutional: Negative for unexpected weight change.  HENT: Negative for hearing loss and rhinorrhea.   Eyes: Negative for visual disturbance.  Respiratory: Negative for cough.   Cardiovascular: Negative for leg swelling.  Gastrointestinal: Negative for constipation and diarrhea.  Genitourinary: Negative for dysuria, frequency and menstrual problem.  Musculoskeletal:       Some right hip pain- has seen ortho  Skin: Negative for rash.  Neurological: Negative for headaches.  Hematological: Negative for adenopathy.  Psychiatric/Behavioral:       Denies depression/anxiety       Past Medical History:  Diagnosis Date  . Hypertension      Social History   Social History  . Marital status: Single    Spouse name: N/A  . Number of children: N/A  . Years of education: N/A   Occupational History  . Not on file.   Social History Main Topics  . Smoking status: Never Smoker  . Smokeless tobacco: Never Used  . Alcohol use No  . Drug use: No  . Sexual activity: No   Other Topics Concern  . 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 HIP SURGERY Right 1992   States that bones were slipping. Stabilized it with 3 screws x 1 yr then had screws removed.    Family History  Problem Relation Age of Onset  . Hypertension Mother        died in MVA in her 49's  . Hypertension Father        had ESRD, die due to infection from his graft  . Diabetes Father   . Heart attack Maternal Uncle     No Known Allergies  Current Outpatient Prescriptions on File Prior to Visit  Medication Sig Dispense Refill  . amLODipine (NORVASC) 10 MG tablet TAKE 1 TABLET DAILY 90 tablet 1  . BLISOVI FE 1.5/30 1.5-30 MG-MCG tablet TAKE 1 TABLET BY MOUTH EVERY DAY 28 tablet 5  . Cholecalciferol (VITAMIN D3) 3000 UNITS TABS Take 1 tablet by mouth daily. 30 tablet   . cyclobenzaprine (FLEXERIL) 10 MG tablet Take 1 tablet (10 mg total) by mouth 3 (three) times daily as needed for muscle spasms. 30 tablet 0  . meloxicam (MOBIC) 7.5 MG tablet Take 1 tablet by mouth as needed.    . metoprolol succinate (TOPROL-XL) 25 MG 24 hr tablet TAKE 1 TABLET DAILY 90 tablet 1  . Multiple Vitamins-Minerals (MULTIVITAMIN WITH MINERALS) tablet Take 1 tablet by mouth daily.    . [DISCONTINUED] cetirizine (ZYRTEC) 10  MG tablet Take 1 tablet (10 mg total) by mouth daily. 30 tablet 11  . [DISCONTINUED] fluticasone (FLONASE) 50 MCG/ACT nasal spray Place 2 sprays into both nostrils daily. 16 g 3   No current facility-administered medications on file prior to visit.     BP 134/88 (BP Location: Left Arm) Comment (Cuff Size): thigh  Pulse 75   Temp 98.2 F (36.8 C) (Oral)   Resp 18   Ht  (1.702 m)   Wt (!) 161 lb 12.8 oz (153.2 kg)   LMP 05/27/2017   SpO2 100%   BMI 52.91 kg/m    Objective:   Physical Exam Physical Exam  Constitutional: Pleasant, morbidly obese female. She is oriented to person, place, and time. She appears well-developed. No distress.  HENT:  Head: Normocephalic and atraumatic.  Right Ear: Tympanic membrane and ear canal normal.  Left Ear: Tympanic membrane and ear  canal normal.  Eyes: Pupils are equal, round, and reactive to light. No scleral icterus.  Neck: Normal range of motion. No thyromegaly present.  Cardiovascular: Normal rate and regular rhythm.   No murmur heard. Pulmonary/Chest: Effort normal and breath sounds normal. No respiratory distress. He has no wheezes. She has no rales. She exhibits no tenderness.  Abdominal: Soft. Bowel sounds are normal. She exhibits no distension and no mass. There is no tenderness. There is no rebound and no guarding.  Musculoskeletal: She exhibits no edema.  Lymphadenopathy:    She has no cervical adenopathy.  Neurological: [name] and oriented to person, place, and time. She has normal patellar reflexes. She exhibits normal muscle tone. Coordination normal.  Skin: Skin is warm and dry.  Psychiatric: She has a normal mood and affect. Her behavior is normal. Judgment and thought content normal.  Breasts: Examined lying Right: Without masses, retractions, discharge or axillary adenopathy.  Left: Without masses, retractions, discharge or axillary adenopathy.            Assessment & Plan:   Preventative care- Discussed healthy diet, regular exercise and weight loss. She would like a referral to bariatrics and this referral has been placed. Will obtain routine lab work including HIV screen. Flu shot today.      Assessment & Plan:

## 2017-06-01 NOTE — Patient Instructions (Addendum)
Please complete lab work prior to leaving. Please work on healthy diet, adding regular exercise and weight loss. You will be contacted about your referral to the bariatric center.

## 2017-06-02 LAB — HIV ANTIBODY (ROUTINE TESTING W REFLEX): HIV: NONREACTIVE

## 2017-10-15 ENCOUNTER — Other Ambulatory Visit: Payer: Self-pay | Admitting: Family

## 2017-10-17 ENCOUNTER — Telehealth: Payer: Self-pay | Admitting: Family

## 2017-10-17 NOTE — Telephone Encounter (Signed)
Called and left message that appt needs to be rescheduled per pcp on 11/30/17. She has opened up 11/29/17 if pt wants this date for appt.

## 2017-11-02 ENCOUNTER — Other Ambulatory Visit: Payer: Self-pay | Admitting: Family

## 2017-11-29 ENCOUNTER — Ambulatory Visit: Payer: BLUE CROSS/BLUE SHIELD | Admitting: Family

## 2017-11-29 ENCOUNTER — Encounter: Payer: Self-pay | Admitting: Family

## 2017-11-29 VITALS — BP 138/84 | HR 83 | Temp 98.4°F | Resp 16 | Ht 67.0 in | Wt 333.6 lb

## 2017-11-29 DIAGNOSIS — I10 Essential (primary) hypertension: Secondary | ICD-10-CM | POA: Diagnosis not present

## 2017-11-29 LAB — BASIC METABOLIC PANEL
BUN: 13 mg/dL (ref 6–23)
CHLORIDE: 103 meq/L (ref 96–112)
CO2: 25 meq/L (ref 19–32)
Calcium: 9 mg/dL (ref 8.4–10.5)
Creatinine, Ser: 1 mg/dL (ref 0.40–1.20)
GFR: 79.16 mL/min (ref >60.00)
GLUCOSE: 88 mg/dL (ref 70–99)
POTASSIUM: 3.6 meq/L (ref 3.5–5.1)
Sodium: 138 mEq/L (ref 135–145)

## 2017-11-29 NOTE — Patient Instructions (Signed)
Please complete lab work prior to leaving. You should be contacted about your referral to nutrition. Try to add exercise to your routine.

## 2017-11-29 NOTE — Progress Notes (Signed)
Subjective:    Patient ID: Shelia Mcgee, female    DOB: May 21, 1976, 42 y.o.   MRN: [id number]  HPI  Patient is a 42 yr old female who presents today for follow up.  HTN- maintained on amlodipine, metoprolol. Denies CP/SOB or swelling, reports good med compliance.   BP Readings from Last 3 Encounters:  11/29/17 138/84  06/01/17 134/88  04/09/17 (!) 146/88     Review of Systems   See HPI  Past Medical History:  Diagnosis Date  . Hypertension      Social History   Socioeconomic History  . Marital status: Single    Spouse name: Not on file  . Number of children: Not on file  . Years of education: Not on file  . Highest education level: Not on file  Occupational History  . Not on file  Social Needs  . Financial resource strain: Not on file  . Food insecurity:    Worry: Not on file    Inability: Not on file  . Transportation needs:    Medical: Not on file    Non-medical: Not on file  Tobacco Use  . Smoking status: Never Smoker  . Smokeless tobacco: Never Used  Substance and Sexual Activity  . Alcohol use: No  . Drug use: No  . Sexual activity: Never    Birth control/protection: Pill  Lifestyle  . Physical activity:    Days per week: Not on file    Minutes per session: Not on file  . Stress: Not on file  Relationships  . Social connections:    Talks on phone: Not on file    Gets together: Not on file    Attends religious service: Not on file    Active member of club or organization: Not on file    Attends meetings of clubs or organizations: Not on file    Relationship status: Not on file  . Intimate partner violence:    Fear of current or ex partner: Not on file    Emotionally abused: Not on file    Physically abused: Not on file    Forced sexual activity: Not on file  Other Topics Concern  . 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 HIP SURGERY Right 1992   States that bones were slipping. Stabilized it with 3 screws x 1 yr then had screws removed.    Family History  Problem Relation Age of Onset  . Hypertension Mother        died in MVA in her 6's  . Hypertension Father        had ESRD, die due to infection from his graft  . Diabetes Father   . Heart attack Maternal Uncle     No Known Allergies  Current Outpatient Medications on File Prior to Visit  Medication Sig Dispense Refill  . amLODipine (NORVASC) 10 MG tablet TAKE 1 TABLET DAILY 90 tablet 1  . Cholecalciferol (VITAMIN D3) 3000 UNITS TABS Take 1 tablet by mouth daily. 30 tablet   . metoprolol succinate (TOPROL-XL) 25 MG 24 hr tablet TAKE 1 TABLET DAILY 90 tablet 1  . MICROGESTIN FE 1.5/30 1.5-30 MG-MCG tablet TAKE 1 TABLET DAILY 84 tablet 3  .  Multiple Vitamins-Minerals (MULTIVITAMIN WITH MINERALS) tablet Take 1 tablet by mouth daily.    . [DISCONTINUED] cetirizine (ZYRTEC) 10 MG tablet Take 1 tablet (10 mg total) by mouth daily. 30 tablet 11  . [DISCONTINUED] fluticasone (FLONASE) 50 MCG/ACT nasal spray Place 2 sprays into both nostrils daily. 16 g 3   No current facility-administered medications on file prior to visit.     BP 138/84 (BP Location: Left Arm, Patient Position: Sitting, Cuff Size: Large)   Pulse 83   Temp 98.4 F (36.9 C) (Oral)   Resp 16   Ht 5\' 7"  (1.702 m)   Wt (!) 333 lb 9.6 oz (151.3 kg)   LMP 11/03/2017   SpO2 100%   BMI 52.25 kg/m        Objective:   Physical Exam  Constitutional: She appears well-developed and well-nourished.  Cardiovascular: Normal rate, regular rhythm and normal heart sounds.  No murmur heard. Pulmonary/Chest: Effort normal and breath sounds normal. No respiratory distress. She has no wheezes.  Psychiatric: She has a normal mood and affect. Her behavior  is normal. Judgment and thought content normal.          Assessment & Plan:  HTN- BP stable, continue current meds. Obtain follow up bmet.  Morbid obesity- discussed diet/exercise/weight loss.  Refer to nutrition.

## 2017-11-30 ENCOUNTER — Ambulatory Visit: Payer: BLUE CROSS/BLUE SHIELD | Admitting: Family

## 2017-12-17 ENCOUNTER — Encounter: Payer: BLUE CROSS/BLUE SHIELD | Attending: Family | Admitting: Registered"

## 2017-12-17 ENCOUNTER — Encounter: Payer: Self-pay | Admitting: Registered"

## 2017-12-17 DIAGNOSIS — Z713 Dietary counseling and surveillance: Secondary | ICD-10-CM | POA: Diagnosis not present

## 2017-12-17 NOTE — Progress Notes (Signed)
Medical Nutrition Therapy:  Appt start time: 0815 end time:  0915.   Assessment:  Primary concerns today: Pt referred for weight management. Pt present for appointment. Pt reports she has been trying to lose weight. Pt reports she is a stress eater. Pt reports she has thought about doing meal prep, but often does not have energy to prep ahead for meals as she reports being tired on the weekends. Pt reports working 7 am-7 pm, Monday-Friday. Pt works at [employer]. She reports wanting to snack due to stress while at work more often than when at home. She reports often getting snacks from the vending machine at work.     Food allergies/intolerances: Pt reports being lactose intolerant.   Preferred Learning Style:   No preference indicated   Learning Readiness:   Ready  MEDICATIONS: See list.    DIETARY INTAKE:  Usual eating pattern includes 2-3 meals and 2 snacks per day. May skip breakfast due to lack of time in the morning and only have lunch and dinner. Typical snacks foods include potato chips, cheddar popcorn, cookies. Pt reports she typically snacks due to stress and snack times vary, but typically occur at work.  Meal Location: At [employer]. At home-living room with TV on. Electronics are utilized during mealtimes.    Everyday foods vary.  Avoided foods include bananas (indigestion), boiled eggs, peanut butter, cottage cheese.    24-hr recall: Weekend Day  B ( AM): half sausage biscuit, water (pt reports she does not usually have breakfast on Sundays)  Snk ( AM): None reported.   L ( PM): Outback-grilled chicken with mushrooms, bacon, and monterrey cheese with loaded mashed potatoes, Coke, cheesecake  Snk ( PM): rest of piece of cheesecake, water D ( PM): 2 hot dogs without buns, ~16 oz Ginger Ale  Snk ( PM): None reported.  Beverages: Coke, ~16 oz Ginger Ale, typically over 100 oz water daily  24-hr recall: Weekday.  B (7-745 AM): None reported.  Snk ( AM): Sour Cream  and Cheddar Chips, Pepsi L (12-2 PM): 1 [place] hot dog with wheat bun, Coke  Snk ( PM): None reported.  D (7-8 PM): 6 fried shrimp, brownie, water  Snk ( PM): None reported.  Beverages:  Pepsi, Coke, typically over 100 oz water daily  Usual physical activity: None reported.    Progress Towards Goal(s):  In progress.   Nutritional Diagnosis:  NI-5.11.1 Predicted suboptimal nutrient intake As related to skipping meals and unbalanced meals low in non-starchy vegetables.  As evidenced by pt's reported dietary recall and habits .    Intervention:  Nutrition counseling provided. Dietitian provided education regarding balanced nutrition and the importance of having regular meals/effects of skipping meals. Discussed foods that can be prepped ahead for breakfast in the morning to help with limited time. Discussed that protein/breakfast drinks are better to have than skipping, but that whole foods are preferred. Discussed that Breakfast Essentials bottled drink is generally tolerated with lactose intolerance, but powder is not. Dietitian provided education on mindful eating. Discussed packing more optimal snacks that contain more fiber and/or protein to have on hand while at work. Set a starting physical activity goal with pt. Pt reports she feels good about taking time while at work to walk 15 minutes on 3 days as a starting goal. Pt appeared agreeable to information/goals discussed.   Goals/Instructions: (Primary goals in bold)   Make sure to get in three meals per day. Try to have balanced meals like the  My Plate example (see handout). Try to include more vegetables, fruits, and whole grains at meals.   Goal: 3 meals per day  Breakfast-Have already prepped foods ready to eat in the morning. Ideas: Greek yogurt and adding your own fruits, whole wheat toast or English muffin with a protein source, protein bar (at least 8 g protein) and some fruit, etc. If unable to do whole foods can do breakfast drink  like Sport and exercise psychologistCarnation Instant Breakfast Essentials.   Snacks: Recommend packing more optimal and filling snacks to have at work.  Try to include more water at meals and less sugar sweetened beverages. Could try a carbonated zero calorie water if desiring carbonation with meals.   Practice Mindful Eating  At meal and snack times, put away electronics (TV, phone, tablet, etc.) and try to eat seated at a table so you can better focus on eating your meal/snack and promote listening to your body's fullness and hunger signals.  If you feel that you are wanting to snack because your are bored or due to emotions and not because you are hungry, try to do a fun activity (read a book, take a walk, talk with a friend, etc.) for at least 20 minutes.   Make physical activity a part of your week. Try to include at least 30 minutes of physical activity 5 days each week or at least 150 minutes per week. Regular physical activity promotes overall health-including helping to reduce risk for heart disease and diabetes, promoting mental health, and helping us sleep better.  Starting Goal: at least 15 minutes of walking 3 days per week.     Teaching Method Utilized:  Visual Auditory  Handouts given during visit include:  Balanced plate and food list.   Breakfast Ideas   Barriers to learning/adherence to lifestyle change: None indicated.   Demonstrated degree of understanding via:  Teach Back   Monitoring/Evaluation:  Dietary intake, exercise, and body weight in 1 month(s).

## 2017-12-17 NOTE — Patient Instructions (Addendum)
Make sure to get in three meals per day. Try to have balanced meals like the My Plate example (see handout). Try to include more vegetables, fruits, and whole grains at meals.   Goal: 3 meals per day  Breakfast-Have already prepped foods ready to eat in the morning. Ideas: Greek yogurt and adding your own fruits, whole wheat toast or English muffin with a protein source, protein bar (at least 8 g protein) and some fruit, etc. If unable to do whole foods can do breakfast drink like Sport and exercise psychologistCarnation Instant Breakfast Essentials.     Snacks: Recommend packing more optimal and filling snacks to have at work.  Try to include more water at meals and less sugar sweetened beverages. Could try a carbonated zero calorie water if desiring carbonation with meals.   Practice Mindful Eating  At meal and snack times, put away electronics (TV, phone, tablet, etc.) and try to eat seated at a table so you can better focus on eating your meal/snack and promote listening to your body's fullness and hunger signals.  If you feel that you are wanting to snack because your are bored or due to emotions and not because you are hungry, try to do a fun activity (read a book, take a walk, talk with a friend, etc.) for at least 20 minutes.   Make physical activity a part of your week. Try to include at least 30 minutes of physical activity 5 days each week or at least 150 minutes per week. Regular physical activity promotes overall health-including helping to reduce risk for heart disease and diabetes, promoting mental health, and helping us sleep better.  Starting Goal: at least 15 minutes of walking 3 days per week.

## 2018-01-17 ENCOUNTER — Encounter: Payer: BLUE CROSS/BLUE SHIELD | Attending: Family | Admitting: Registered"

## 2018-01-17 ENCOUNTER — Encounter: Payer: Self-pay | Admitting: Registered"

## 2018-01-17 DIAGNOSIS — Z713 Dietary counseling and surveillance: Secondary | ICD-10-CM | POA: Insufficient documentation

## 2018-01-17 NOTE — Progress Notes (Signed)
Medical Nutrition Therapy:  Appt start time: 1415 end time:  1445.   Assessment:  Primary concerns today: Pt referred for weight management. Nutrition Follow-Up: Pt present for appointment. Pt reports that some things have improved since last appointment. She reports she has been doing better with getting breakfast and that she has added a 15 minute walk at least 3 days per week. Pt reports that she has also been being more mindful of what foods she eats, especially at snack times, has been putting away electronics at mealtimes about half of the time, and has been drinking more water. Pt reports that she is still having a hard time getting in 3 meals each day because often times her lunch break is late in the afternoon, around 3 pm, and then she is not very hungry when she gets home around dinner time (7-8 pm).   Food allergies/intolerances: Pt reports being lactose intolerant.   Preferred Learning Style:   No preference indicated   Learning Readiness:   Ready  MEDICATIONS: See list.    DIETARY INTAKE:  Usual eating pattern includes 2-3 meals and 2 snacks per day. Typical snacks foods now include nuts, protein or cereal bars. Pt reports she has been trying to be more mindful of what foods she eats for snacks.    Everyday foods vary.  Avoided foods include bananas (indigestion), boiled eggs, peanut butter, cottage cheese.    24-hr recall: Weekday.  B (7-745 AM): unsure what she had yesterday-today had 1 piece of sausage, 2 scrambled eggs,water  Snk ( AM): nuts, water L (12-2 PM): fried chicken tender, macaroni and cheese, sweet potato, lemonade  Snk ( PM): water   D ( PM): None reported.  Snk ( PM): None reported.  Beverages:  Typically over 100 oz water, lemonade   Usual physical activity: 15 minute walks at least 3 days per week.   Progress Towards Goal(s):  Some progress.   Nutritional Diagnosis:  NI-5.11.1 Predicted suboptimal nutrient intake As related to skipping meals and  unbalanced meals low in non-starchy vegetables.  As evidenced by pt's reported dietary recall and habits .    Intervention:  Nutrition counseling provided. Dietitian praised pt's progress with getting breakfast, drinking less sugar sweetened beverages, being more mindful with snacks and at mealtimes, including more beneficial snacks, and being more physically active. Encouraged pt to continue working on getting 3 meals per day. Discussed that dinner does not have to be a full meal-that including a small meal would be more beneficial than skipping. Recommended ensuring she has a protein and carbohydrate source for dinner. Discussed switching up breakfast half of the days as pt reports having eggs every day for breakfast. Discussed continuing to work to be mindful at mealtimes. Pt set new activity goal of working to walk at least 15 minutes 4 days per week. Pt appeared agreeable to information/goals discussed.   Goals/Instructions: (Primary goals in bold)   Make sure to get in three meals per day. Try to have balanced meals like the My Plate example (see handout). Try to include more vegetables, fruits, and whole grains at meals.   Goal: Continue working to have 3 meals per day. Great job with working to get in breakfast and drinking more water and less sweetened beverages!  If you do not feel like having a regular sized meal at dinner-having a light meal will be better than skipping. Include some energy (carbohydrate) and protein. Could do [place] yogurt with some fruit and nuts, nut butter  sandwich on whole wheat, whole wheat crackers and hummus, etc.  Continue working to include more vegetables at meals.   Practice Mindful Eating  Goal: Continue working to be mindful at meals. Great job with progress of putting away electronics half of the time! At meal and snack times, put away electronics (TV, phone, tablet, etc.) and try to eat seated at a table so you can better focus on eating your meal/snack and  promote listening to your body's fullness and hunger signals.  If you feel that you are wanting to snack because your are bored or due to emotions and not because you are hungry, try to do a fun activity (read a book, take a walk, talk with a friend, etc.) for at least 20 minutes.   Make physical activity a part of your week. Try to include at least 30 minutes of physical activity 5 days each week or at least 150 minutes per week. Regular physical activity promotes overall health-including helping to reduce risk for heart disease and diabetes, promoting mental health, and helping [place] sleep better.  Goal: Great job with reaching starting goal. New Goal: Include at least 15 minutes of walking 4 days per week.   Teaching Method Utilized:  Visual Auditory  Barriers to learning/adherence to lifestyle change: None indicated.   Demonstrated degree of understanding via:  Teach Back   Monitoring/Evaluation:  Dietary intake, exercise, and body weight in 2 month(s).

## 2018-01-17 NOTE — Patient Instructions (Addendum)
Goals/Instructions: (Primary goals in bold)   Make sure to get in three meals per day. Try to have balanced meals like the My Plate example (see handout). Try to include more vegetables, fruits, and whole grains at meals.   Goal: Continue working to have 3 meals per day. Great job with working to get in breakfast and drinking more water and less sweetened beverages!  If you do not feel like having a regular sized meal at dinner-having a light meal will be better than skipping. Include some energy (carbohydrate) and protein. Could do [place]ogurt with some fruit and nuts, nut butter sandwich on whole wheat, whole wheat crackers and hummus, etc.  Continue working to include more vegetables at meals.   Practice Mindful Eating  Goal: Continue working to be mindful at meals. Great job with progress of putting away electronics half of the time! At meal and snack times, put away electronics (TV, phone, tablet, etc.) and try to eat seated at a table so you can better focus on eating your meal/snack and promote listening to your body's fullness and hunger signals.  If you feel that you are wanting to snack because your are bored or due to emotions and not because you are hungry, try to do a fun activity (read a book, take a walk, talk with a friend, etc.) for at least 20 minutes.   Make physical activity a part of your week. Try to include at least 30 minutes of physical activity 5 days each week or at least 150 minutes per week. Regular physical activity promotes overall health-including helping to reduce risk for heart disease and diabetes, promoting mental health, and helping [place] sleep better.  Goal: Great job with reaching starting goal. New Goal: Include at least 15 minutes of walking 4 days per week.

## 2018-03-11 ENCOUNTER — Encounter: Payer: Self-pay | Admitting: Family

## 2018-03-11 ENCOUNTER — Ambulatory Visit: Payer: BLUE CROSS/BLUE SHIELD | Admitting: Family

## 2018-03-11 VITALS — BP 134/87 | HR 83 | Temp 98.1°F | Resp 18 | Ht 67.0 in | Wt 337.0 lb

## 2018-03-11 DIAGNOSIS — R51 Headache: Secondary | ICD-10-CM

## 2018-03-11 DIAGNOSIS — R519 Headache, unspecified: Secondary | ICD-10-CM

## 2018-03-11 DIAGNOSIS — M25551 Pain in right hip: Secondary | ICD-10-CM | POA: Diagnosis not present

## 2018-03-11 NOTE — Patient Instructions (Signed)
You will be contacted about your referral to neurology for your headaches. You may use ibuprofen as needed for pain. Call if new/worsening symptoms.  You should be contacted about your referral to orthopedics for your hip pain.

## 2018-03-11 NOTE — Progress Notes (Signed)
Subjective:    Patient ID: Shelia Mcgee, female    DOB: November 05, 1975, 42 y.o.   MRN: [id number]  HPI  Patient is a 42 yr old female who presents today with c/o 2 week hx of frontal HA's. Throbbing. Comes/goes.  No alleviating factors.  Denies nausea/vomiting or vision problems. Reports + increased sneezing.  Taking zyrtec.  Reports 0-1 caffeiinated  Drinks.      R sided hip pain- reports that it has been present x >1 year and is not improved by use of anti-inflammatories.   Review of Systems    see HPI  Past Medical History:  Diagnosis Date  . Hypertension      Social History   Socioeconomic History  . Marital status: Single    Spouse name: Not on file  . Number of children: Not on file  . Years of education: Not on file  . Highest education level: Not on file  Occupational History  . Not on file  Social Needs  . Financial resource strain: Not on file  . Food insecurity:    Worry: Not on file    Inability: Not on file  . Transportation needs:    Medical: Not on file    Non-medical: Not on file  Tobacco Use  . Smoking status: Never Smoker  . Smokeless tobacco: Never Used  Substance and Sexual Activity  . Alcohol use: No  . Drug use: No  . Sexual activity: Never    Birth control/protection: Pill  Lifestyle  . Physical activity:    Days per week: Not on file    Minutes per session: Not on file  . Stress: Not on file  Relationships  . Social connections:    Talks on phone: Not on file    Gets together: Not on file    Attends religious service: Not on file    Active member of club or organization: Not on file    Attends meetings of clubs or organizations: Not on file    Relationship status: Not on file  . Intimate partner violence:    Fear of current or ex partner: Not on file    Emotionally abused: Not on file    Physically abused: Not on file    Forced sexual activity: Not on file  Other Topics Concern  . 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 HIP SURGERY Right 1992   States that bones were slipping. Stabilized it with 3 screws x 1 yr then had screws removed.    Family History  Problem Relation Age of Onset  . Hypertension Mother        died in MVA in her 85's  . Hypertension Father        had ESRD, die due to infection from his graft  . Diabetes Father   . Stroke Father   . Heart attack Maternal Uncle     No Known Allergies  Current Outpatient Medications on File Prior to Visit  Medication Sig Dispense Refill  . amLODipine (NORVASC) 10 MG tablet TAKE 1 TABLET DAILY 90 tablet 1  . Cholecalciferol (VITAMIN D3) 3000 UNITS TABS Take 1 tablet by mouth daily. 30 tablet   . metoprolol succinate (TOPROL-XL)  25 MG 24 hr tablet TAKE 1 TABLET DAILY 90 tablet 1  . MICROGESTIN FE 1.5/30 1.5-30 MG-MCG tablet TAKE 1 TABLET DAILY 84 tablet 3  . Multiple Vitamins-Minerals (MULTIVITAMIN WITH MINERALS) tablet Take 1 tablet by mouth daily.    . [DISCONTINUED] cetirizine (ZYRTEC) 10 MG tablet Take 1 tablet (10 mg total) by mouth daily. 30 tablet 11  . [DISCONTINUED] fluticasone (FLONASE) 50 MCG/ACT nasal spray Place 2 sprays into both nostrils daily. 16 g 3   No current facility-administered medications on file prior to visit.     BP 134/87 (BP Location: Right Arm, Cuff Size: Large)   Pulse 83   Temp 98.1 F (36.7 C) (Oral)   Resp 18   Ht 5\' 7"  (1.702 m)   Wt (!) 337 lb (152.9 kg)   LMP 03/02/2018   SpO2 98%   BMI 52.78 kg/m    Objective:   Physical Exam  Constitutional: She appears well-developed and well-nourished.  HENT:  Right Ear: Hearing and tympanic membrane normal.  Left Ear: Hearing and tympanic membrane normal.  Nose: Right sinus exhibits no maxillary sinus tenderness  and no frontal sinus tenderness. Left sinus exhibits no frontal sinus tenderness.  Cardiovascular: Normal rate, regular rhythm and normal heart sounds.  No murmur heard. Pulmonary/Chest: Effort normal and breath sounds normal. No respiratory distress. She has no wheezes.  Psychiatric: She has a normal mood and affect. Her behavior is normal. Judgment and thought content normal.          Assessment & Plan:  Frontal HA- etiology unclear. No sign of sinusitis. Will refer to neurology. Advised sparing use of prn tylenol and to call if new/worsening symtoms.  R hip pain- refer to ortho, she is requesting a new orthopedic office.

## 2018-03-19 ENCOUNTER — Emergency Department (HOSPITAL_BASED_OUTPATIENT_CLINIC_OR_DEPARTMENT_OTHER)
Admission: EM | Admit: 2018-03-19 | Discharge: 2018-03-19 | Disposition: A | Discharge status: Home / Self Care | Payer: BLUE CROSS/BLUE SHIELD | Attending: Emergency Medicine | Admitting: Emergency Medicine

## 2018-03-19 ENCOUNTER — Other Ambulatory Visit: Payer: Self-pay

## 2018-03-19 ENCOUNTER — Encounter (HOSPITAL_BASED_OUTPATIENT_CLINIC_OR_DEPARTMENT_OTHER): Payer: Self-pay | Admitting: Emergency Medicine

## 2018-03-19 DIAGNOSIS — I10 Essential (primary) hypertension: Secondary | ICD-10-CM | POA: Diagnosis not present

## 2018-03-19 DIAGNOSIS — R51 Headache: Secondary | ICD-10-CM | POA: Insufficient documentation

## 2018-03-19 DIAGNOSIS — R519 Headache, unspecified: Secondary | ICD-10-CM

## 2018-03-19 DIAGNOSIS — Z79899 Other long term (current) drug therapy: Secondary | ICD-10-CM | POA: Insufficient documentation

## 2018-03-19 MED ORDER — DIPHENHYDRAMINE HCL 50 MG/ML IJ SOLN
25.0000 mg | Freq: Once | INTRAMUSCULAR | Status: AC
  Administered 2018-03-19: 25 mg via INTRAVENOUS
  Filled 2018-03-19: qty 1

## 2018-03-19 MED ORDER — SODIUM CHLORIDE 0.9 % IV BOLUS
1000.0000 mL | Freq: Once | INTRAVENOUS | Status: AC
Start: 2018-03-19 — End: 2018-03-19
  Administered 2018-03-19: 1000 mL via INTRAVENOUS

## 2018-03-19 MED ORDER — PROCHLORPERAZINE EDISYLATE 10 MG/2ML IJ SOLN
10.0000 mg | Freq: Once | INTRAMUSCULAR | Status: AC
  Administered 2018-03-19: 10 mg via INTRAVENOUS
  Filled 2018-03-19: qty 2

## 2018-03-19 MED ORDER — PROCHLORPERAZINE MALEATE 10 MG PO TABS: 10.0000 mg | ORAL_TABLET | Freq: Two times a day (BID) | ORAL | 0 refills | Status: DC | PRN

## 2018-03-19 NOTE — Discharge Instructions (Signed)
Your history and exam today was overall reassuring and you had improved symptoms with the medications.  Please follow-up with your neurologist for further headache management.  If any symptoms change or worsen or you are unable to maintain hydration with nausea and vomiting, please return to the nearest emergency department.

## 2018-03-19 NOTE — ED Provider Notes (Signed)
[facility] EMERGENCY DEPARTMENT Provider Note   CSN: [id number] Arrival date & time: 03/19/18  1735     History   Chief Complaint Chief Complaint  Patient presents with  . Headache    HPI Shelia Mcgee is a 42 y.o. female.  The history is provided by the patient, a friend and medical records.  Headache   This is a new problem. The current episode started more than 1 week ago (3 weeks). The problem occurs constantly. The problem has not changed since onset.The headache is associated with bright light and loud noise. The pain is located in the frontal region. The quality of the pain is described as dull and throbbing. The pain is at a severity of 10/10 (6/10 currently). The pain does not radiate. Associated symptoms include nausea. Pertinent negatives include no fever, no malaise/fatigue, no chest pressure, no near-syncope, no palpitations, no syncope, no shortness of breath and no vomiting. She has tried NSAIDs and acetaminophen for the symptoms. The treatment provided no relief.    Past Medical History:  Diagnosis Date  . Hypertension     Patient Active Problem List   Diagnosis Date Noted  . Arthritis 11/08/2015  . Vitamin D deficiency 05/31/2015  . Allergic rhinitis 03/10/2015  . Contraceptive management 01/22/2015  . Routine general medical examination at a health care facility 05/22/2014  . Vaginitis 04/29/2014  . HTN (hypertension) 04/29/2014    Past Surgical History:  Procedure Laterality Date  . HIP SURGERY Right 1992   States that bones were slipping. Stabilized it with 3 screws x 1 yr then had screws removed.     OB History   None      Home Medications    Prior to Admission medications   Medication Sig Start Date End Date Taking? Authorizing Provider  amLODipine (NORVASC) 10 MG tablet TAKE 1 TABLET DAILY 11/02/17   Sandford Craze, NP  Cholecalciferol (VITAMIN D3) 3000 UNITS TABS Take 1 tablet by mouth daily. 08/17/15   Sandford Craze, NP  metoprolol succinate (TOPROL-XL) 25 MG 24 hr tablet TAKE 1 TABLET DAILY 10/15/17   Sandford Craze, NP  MICROGESTIN FE 1.5/30 1.5-30 MG-MCG tablet TAKE 1 TABLET DAILY 10/15/17   Sandford Craze, NP  Multiple Vitamins-Minerals (MULTIVITAMIN WITH MINERALS) tablet Take 1 tablet by mouth daily.    [provider]  cetirizine (ZYRTEC) 10 MG tablet Take 1 tablet (10 mg total) by mouth daily. 03/09/15 08/28/15  Sandford Craze, NP  fluticasone (FLONASE) 50 MCG/ACT nasal spray Place 2 sprays into both nostrils daily. 03/09/15 08/28/15  Sandford Craze, NP    Family History Family History  Problem Relation Age of Onset  . Hypertension Mother        died in MVA in her 35's  . Hypertension Father        had ESRD, die due to infection from his graft  . Diabetes Father   . Stroke Father   . Heart attack Maternal Uncle     Social History Social History   Tobacco Use  . Smoking status: Never Smoker  . Smokeless tobacco: Never Used  Substance Use Topics  . Alcohol use: No  . Drug use: No     Allergies   Patient has no known allergies.   Review of Systems Review of Systems  Constitutional: Negative for chills, diaphoresis, fatigue, fever and malaise/fatigue.  HENT: Negative for congestion.   Eyes: Positive for photophobia. Negative for visual disturbance.  Respiratory: Negative for cough, chest tightness and shortness  of breath.   Cardiovascular: Negative for palpitations, syncope and near-syncope.  Gastrointestinal: Positive for nausea. Negative for abdominal pain and vomiting.  Genitourinary: Negative for dysuria, flank pain and frequency.  Musculoskeletal: Negative for back pain, neck pain and neck stiffness.  Neurological: Positive for dizziness, light-headedness and headaches. Negative for tremors, seizures, syncope, speech difficulty and weakness.  Hematological: Negative for adenopathy.  Psychiatric/Behavioral: Negative for agitation.  All other  systems reviewed and are negative.    Physical Exam Updated Vital Signs BP (!) 162/114 (BP Location: Right Arm)   Pulse 72   Temp 98.1 F (36.7 C) (Oral)   Resp 18   Ht 5\' 7"  (1.702 m)   Wt (!) 152.9 kg (337 lb)   LMP 03/02/2018   SpO2 99%   BMI 52.78 kg/m   Physical Exam  Constitutional: She is oriented to person, place, and time. She appears well-developed and well-nourished.  Non-toxic appearance. She does not appear ill. No distress.  HENT:  Head: Normocephalic and atraumatic.  Mouth/Throat: Oropharynx is clear and moist.  Eyes: Pupils are equal, round, and reactive to light. Conjunctivae and EOM are normal. Right eye exhibits normal extraocular motion and no nystagmus. Left eye exhibits normal extraocular motion and no nystagmus. Right pupil is round and reactive. Left pupil is round and reactive.  Neck: Normal range of motion. Neck supple. No neck rigidity.  Cardiovascular: Normal rate and regular rhythm.  No murmur heard. Pulmonary/Chest: Effort normal and breath sounds normal. No stridor. No respiratory distress. She has no wheezes. She has no rales. She exhibits no tenderness.  Abdominal: Soft. She exhibits no distension. There is no tenderness.  Musculoskeletal: She exhibits no edema.  Lymphadenopathy:    She has no cervical adenopathy.  Neurological: [name] and oriented to person, place, and time. She has normal strength. She is not disoriented. No cranial nerve deficit or sensory deficit. She displays a negative Romberg sign. Coordination and gait normal. GCS eye subscore is 4. GCS verbal subscore is 5. GCS motor subscore is 6.  Skin: Skin is warm and dry. No rash noted. She is not diaphoretic. No erythema.  Psychiatric: She has a normal mood and affect. Her mood appears not anxious. She is not agitated.  Nursing note and vitals reviewed.    ED Treatments / Results  Labs (all labs ordered are listed, but only abnormal results are displayed) Labs Reviewed -  No data to display  EKG None  Radiology No results found.  Procedures Procedures (including critical care time)  Medications Ordered in ED Medications  sodium chloride 0.9 % bolus 1,000 mL (0 mLs Intravenous Stopped 03/19/18 2154)  prochlorperazine (COMPAZINE) injection 10 mg (10 mg Intravenous Given 03/19/18 1844)  diphenhydrAMINE (BENADRYL) injection 25 mg (25 mg Intravenous Given 03/19/18 1844)     Initial Impression / Assessment and Plan / ED Course  I have reviewed the triage vital signs and the nursing notes.  Pertinent labs & imaging results that were available during my care of the patient were reviewed by me and considered in my medical decision making (see chart for details).     Punam Broussard is a 42 y.o. female with a past medical history significant for hypertension who presents with headache.  Patient reports that for the last 3 weeks she has been having severe headaches.  She reports that it waxes and wanes but it has been constant and never fully gone away.  She reports that she saw her PCP 2 weeks ago who  recommended to follow-up with neurology as well as using NSAIDs and Tylenol.  Patient says the symptoms are not improved and she has not yet seen neurology.  She reports her pain gets up to a 10 out of 10 severity at times but is currently a 6 out of 10.  She reports nausea but no vomiting.  She reports occasional dizziness and lightheadedness which she experienced this morning.  She denies any vision changes.  She denies any coordination difficulties, numbness, tingling, or weakness of extremities.  She denies speech difficulties.  She reports no neck pain, neck stiffness, fevers, chills, or other symptoms.  She does take birth control pills but denies any history of venous thromboembolism.  On exam, patient has normal gait.  Negative Romberg.  Normal coordination with finger-nose-finger testing.  Normal sensation and strength throughout.  Patient does have photophobia but  had reactive pupils and normal extraocular movements.  Clear speech.  No neck stiffness or nuchal rigidity.  No neck tenderness.  Exam otherwise unremarkable aside from photophobia and phonophobia.  Next  Clinically I suspect patient has a migrainous type headache however with her history of birth control use and the prolonged atypical headache lasting 3 weeks, venous sinus thrombosis also considered.  Patient says she feels a sinus infection is less likely as she has not had one for several years and she has no congestion and sinus symptoms aside from the frontal pain.  Patient had a shared decision-making conversation leading to attempting a headache cocktail initially.  If headache does not improve, will consider CTV to further evaluate.  Low suspicion for meningitis or other concerning etiology of symptoms at this time.  No focal neurologic deficits felt on exam.  9:48 PM On reassessment, patient reports her headache is completely resolved.  She reports no lightheadedness or dizziness.  She has normal gait and has no other focal neurologic symptoms.  At this time I do not feel she needs imaging is improved.  Patient given prescription for Compazine and will follow-up with her neurologist for further headache management strategies.  Patient understood return precautions for new or worsened symptoms and was discharged in good condition with resolved symptoms.   Final Clinical Impressions(s) / ED Diagnoses   Final diagnoses:  Bad headache    ED Discharge Orders        Ordered    prochlorperazine (COMPAZINE) 10 MG tablet  2 times daily PRN     03/19/18 2149      Clinical Impression: 1. Bad headache     Disposition: Discharge  Condition: Good  I have discussed the results, Dx and Tx plan with the pt(& family if present). He/she/they expressed understanding and agree(s) with the plan. Discharge instructions discussed at great length. Strict return precautions discussed and pt &/or  family have verbalized understanding of the instructions. No further questions at time of discharge.    New Prescriptions   PROCHLORPERAZINE (COMPAZINE) 10 MG TABLET    Take 1 tablet (10 mg total) by mouth 2 (two) times daily as needed for nausea or vomiting.    Follow Up: Sandford Craze'Sullivan, Melissa, NP [address] [place] [id number] [phone]     [facility] [address] [id number] [address] [id number] [address] [place] [id number] [phone]       Joseh Sjogren, Canary Brimhristopher J, MD 03/19/18 2232

## 2018-03-19 NOTE — ED Notes (Signed)
Pt given graham crackers and ginger ale ?

## 2018-03-19 NOTE — ED Triage Notes (Signed)
Headache x 2 weeks with nausea and dizziness.

## 2018-03-19 NOTE — ED Notes (Signed)
Pt. Reports she has a headache for 3 weeks and history of migraines never diagnosed.

## 2018-03-20 ENCOUNTER — Encounter: Payer: Self-pay | Admitting: Neurology

## 2018-03-21 ENCOUNTER — Ambulatory Visit: Payer: BLUE CROSS/BLUE SHIELD | Admitting: Registered"

## 2018-03-25 ENCOUNTER — Telehealth: Payer: Self-pay | Admitting: *Deleted

## 2018-03-25 NOTE — Telephone Encounter (Signed)
Shelia Mcgee -- can you please see if we can get pt in with another provider this week for her ER follow up?

## 2018-03-25 NOTE — Telephone Encounter (Signed)
Copied from CRM [phone]. Topic: Appointment Scheduling - Scheduling Inquiry for Clinic >> Mar 25, 2018  7:03 AM Leafy Roobinson, Norma J wrote: Reason for CRM:pt went to er on 7-16 and needs post er fup with Shelia KaufmannMelissa she is not avail this week. Pt will call her neurologist to see if she can get sooner appt than sept. This appt is follow up on headaches

## 2018-03-25 NOTE — Telephone Encounter (Signed)
Called pt to attempt to schedule an ER follow up. Pt declined to make an appt and stated that she was contacting a neurologist tomorrow. I advised pt to call back if she needed any further assistance.

## 2018-03-26 DIAGNOSIS — Z6841 Body Mass Index (BMI) 40.0 and over, adult: Secondary | ICD-10-CM | POA: Insufficient documentation

## 2018-03-26 DIAGNOSIS — G43009 Migraine without aura, not intractable, without status migrainosus: Secondary | ICD-10-CM | POA: Insufficient documentation

## 2018-04-09 DIAGNOSIS — M25551 Pain in right hip: Secondary | ICD-10-CM | POA: Insufficient documentation

## 2018-05-13 NOTE — Telephone Encounter (Signed)
Noted  

## 2018-05-20 ENCOUNTER — Other Ambulatory Visit: Payer: Self-pay | Admitting: Family

## 2018-05-20 NOTE — Progress Notes (Deleted)
NEUROLOGY CONSULTATION NOTE  Shelia Mcgee MRN: [id number] DOB: [birth year]  Referring provider: Sandford Craze, NP Primary care provider: Sandford Craze, NP  Reason for consult:  headache  HISTORY OF PRESENT ILLNESS: Shelia Mcgee is a 42 year old ***-handed female with hypertension who presents for headaches.  History supplemented by ED and referring provider's note.  Onset:  *** Location:  *** Quality:  *** Intensity:  ***.  *** denies new headache, thunderclap headache or severe headache that wakes *** from sleep. Aura:  *** Prodrome:  *** Postdrome:  *** Associated symptoms:  ***.  *** denies associated unilateral numbness or weakness. Duration:  *** Frequency:  *** Frequency of abortive medication: *** Triggers:  *** Exacerbating factors:  *** Relieving factors:  *** Activity:  ***  Current NSAIDS:  *** Current analgesics:  *** Current triptans:  *** Current ergotamine:  *** Current anti-emetic:  Compazine 10mg  Current muscle relaxants:  *** Current anti-anxiolytic:  *** Current sleep aide:  *** Current Antihypertensive medications:  Amlodipine 10mg , Toprol XL 25mg  Current Antidepressant medications:  *** Current Anticonvulsant medications:  *** Current anti-CGRP:  *** Current Vitamins/Herbal/Supplements:  *** Current Antihistamines/Decongestants:  *** Other therapy:  *** Hormone/birth control:  Microgestin Fe  Past NSAIDS:  meloxicam (***), naproxen 500mg  (***) Past analgesics:  *** Past abortive triptans:  *** Past abortive ergotamine:  *** Past muscle relaxants:  Flexeril 10mg  Past anti-emetic:  *** Past antihypertensive medications:  *** Past antidepressant medications:  *** Past anticonvulsant medications:  *** Past anti-CGRP:  *** Past vitamins/Herbal/Supplements:  *** Past antihistamines/decongestants:  *** Other past therapies:  ***  Caffeine:  *** Alcohol:  *** Smoker:  *** Diet:  *** Exercise:  *** Depression:  ***; Anxiety:   *** Other pain:  Low back pain, right hip pain Sleep hygiene:  *** Family history of headache:  **  11/29/17 BMP:  Na 138, K 3.6, Cl 103, CO2 25, glucose 88, BUN 13, Cr 1.00  PAST MEDICAL HISTORY: Past Medical History:  Diagnosis Date  . Hypertension     PAST SURGICAL HISTORY: Past Surgical History:  Procedure Laterality Date  . HIP SURGERY Right 1992   States that bones were slipping. Stabilized it with 3 screws x 1 yr then had screws removed.    MEDICATIONS: Current Outpatient Medications on File Prior to Visit  Medication Sig Dispense Refill  . amLODipine (NORVASC) 10 MG tablet TAKE 1 TABLET DAILY 90 tablet 1  . Cholecalciferol (VITAMIN D3) 3000 UNITS TABS Take 1 tablet by mouth daily. 30 tablet   . metoprolol succinate (TOPROL-XL) 25 MG 24 hr tablet TAKE 1 TABLET DAILY 90 tablet 1  . MICROGESTIN FE 1.5/30 1.5-30 MG-MCG tablet TAKE 1 TABLET DAILY 84 tablet 3  . Multiple Vitamins-Minerals (MULTIVITAMIN WITH MINERALS) tablet Take 1 tablet by mouth daily.    . prochlorperazine (COMPAZINE) 10 MG tablet Take 1 tablet (10 mg total) by mouth 2 (two) times daily as needed for nausea or vomiting. 10 tablet 0   No current facility-administered medications on file prior to visit.     ALLERGIES: No Known Allergies  FAMILY HISTORY: Family History  Problem Relation Age of Onset  . Hypertension Mother        died in MVA in her 77's  . Hypertension Father        had ESRD, die due to infection from his graft  . Diabetes Father   . Stroke Father   . Heart attack Maternal Uncle    SOCIAL HISTORY: Social History  Socioeconomic History  . Marital status: Single    Spouse name: Not on file  . Number of children: Not on file  . Years of education: Not on file  . Highest education level: Not on file  Occupational History  . Not on file  Social Needs  . Financial resource strain: Not on file  . Food insecurity:    Worry: Not on file    Inability: Not on file  . Transportation  needs:    Medical: Not on file    Non-medical: Not on file  Tobacco Use  . Smoking status: Never Smoker  . Smokeless tobacco: Never Used  Substance and Sexual Activity  . Alcohol use: No  . Drug use: No  . Sexual activity: Never    Birth control/protection: Pill  Lifestyle  . Physical activity:    Days per week: Not on file    Minutes per session: Not on file  . Stress: Not on file  Relationships  . Social connections:    Talks on phone: Not on file    Gets together: Not on file    Attends religious service: Not on file    Active member of club or organization: Not on file    Attends meetings of clubs or organizations: Not on file    Relationship status: Not on file  . Intimate partner violence:    Fear of current or ex partner: Not on file    Emotionally abused: Not on file    Physically abused: Not on file    Forced sexual activity: Not on file  Other Topics Concern  . 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   Works also at [employer]   Completed bachelors in [employer]   Enjoys bowling, spending time with friends, church    REVIEW OF SYSTEMS: Constitutional: No fevers, chills, or sweats, no generalized fatigue, change in appetite Eyes: No visual changes, double vision, eye pain Ear, nose and throat: No hearing loss, ear pain, nasal congestion, sore throat Cardiovascular: No chest pain, palpitations Respiratory:  No shortness of breath at rest or with exertion, wheezes GastrointestinaI: No nausea, vomiting, diarrhea, abdominal pain, fecal incontinence Genitourinary:  No dysuria, urinary retention or frequency Musculoskeletal:  No neck pain, back pain Integumentary: No rash, pruritus, skin lesions Neurological: as above Psychiatric: No depression, insomnia, anxiety Endocrine: No palpitations, fatigue, diaphoresis, mood swings, change in appetite, change in weight, increased  thirst Hematologic/Lymphatic:  No purpura, petechiae. Allergic/Immunologic: no itchy/runny eyes, nasal congestion, recent allergic reactions, rashes  PHYSICAL EXAM: *** General: No acute distress.  Patient appears ***-groomed.  *** Head:  Normocephalic/atraumatic Eyes:  fundi examined but not visualized Neck: supple, no paraspinal tenderness, full range of motion Back: No paraspinal tenderness Heart: regular rate and rhythm Lungs: Clear to auscultation bilaterally. Vascular: No carotid bruits. Neurological Exam: Mental status: alert and oriented to person, place, and time, recent and remote memory intact, fund of knowledge intact, attention and concentration intact, speech fluent and not dysarthric, language intact. Cranial nerves: CN I: not tested CN II: pupils equal, round and reactive to light, visual fields intact CN III, IV, VI:  full range of motion, no nystagmus, no ptosis CN V: facial sensation intact CN VII: upper and lower face symmetric CN VIII: hearing intact CN IX, X: gag intact, uvula midline CN XI: sternocleidomastoid and trapezius muscles intact CN XII: tongue midline Bulk & Tone: normal, no  fasciculations. Motor:  5/5 throughout *** Sensation:  Pinprick *** temperature *** and vibration sensation intact.  ***. Deep Tendon Reflexes:  2+ throughout, *** toes downgoing.  *** Finger to nose testing:  Without dysmetria.  *** Heel to shin:  Without dysmetria.  *** Gait:  Normal station and stride.  Able to turn and tandem walk. Romberg ***.  IMPRESSION: ***  PLAN: 1.  *** 2.  For abortive therapy, *** 3.  Limit use of pain relievers to no more than 2 days out of week to prevent risk of rebound or medication-overuse headache. 4.  Keep headache diary 5.  ***  Thank you for allowing me to take part in the care of this patient.  Shon MilletAdam Colbe Viviano, DO  CC: Sandford CrazeMelissa O'Sullivan

## 2018-05-21 ENCOUNTER — Encounter

## 2018-05-21 ENCOUNTER — Ambulatory Visit: Payer: BLUE CROSS/BLUE SHIELD | Admitting: Neurology

## 2018-05-21 DIAGNOSIS — Z029 Encounter for administrative examinations, unspecified: Secondary | ICD-10-CM

## 2018-05-22 ENCOUNTER — Encounter: Payer: Self-pay | Admitting: Neurology

## 2018-06-03 ENCOUNTER — Encounter: Payer: Self-pay | Admitting: Family

## 2018-06-03 ENCOUNTER — Other Ambulatory Visit (HOSPITAL_COMMUNITY)
Admission: RE | Admit: 2018-06-03 | Discharge: 2018-06-03 | Disposition: A | Discharge status: Home / Self Care | Payer: BLUE CROSS/BLUE SHIELD | Source: Ambulatory Visit | Attending: Family | Admitting: Family

## 2018-06-03 ENCOUNTER — Ambulatory Visit (INDEPENDENT_AMBULATORY_CARE_PROVIDER_SITE_OTHER): Payer: BLUE CROSS/BLUE SHIELD | Admitting: Family

## 2018-06-03 VITALS — BP 130/90 | HR 80 | Temp 98.1°F | Resp 16 | Ht 67.0 in | Wt 338.0 lb

## 2018-06-03 DIAGNOSIS — Z01419 Encounter for gynecological examination (general) (routine) without abnormal findings: Secondary | ICD-10-CM

## 2018-06-03 DIAGNOSIS — Z0000 Encounter for general adult medical examination without abnormal findings: Secondary | ICD-10-CM

## 2018-06-03 DIAGNOSIS — Z0001 Encounter for general adult medical examination with abnormal findings: Secondary | ICD-10-CM

## 2018-06-03 DIAGNOSIS — R200 Anesthesia of skin: Secondary | ICD-10-CM | POA: Diagnosis not present

## 2018-06-03 DIAGNOSIS — Z23 Encounter for immunization: Secondary | ICD-10-CM | POA: Diagnosis not present

## 2018-06-03 LAB — URINALYSIS, ROUTINE W REFLEX MICROSCOPIC
BILIRUBIN URINE: NEGATIVE
HGB URINE DIPSTICK: NEGATIVE
KETONES UR: NEGATIVE
LEUKOCYTES UA: NEGATIVE
NITRITE: NEGATIVE
PH: 7.5 (ref 5.0–8.0)
RBC / HPF: NONE SEEN (ref 0–?)
Specific Gravity, Urine: 1.01 (ref 1.000–1.030)
TOTAL PROTEIN, URINE-UPE24: NEGATIVE
URINE GLUCOSE: NEGATIVE
Urobilinogen, UA: 0.2 (ref 0.0–1.0)

## 2018-06-03 NOTE — Progress Notes (Signed)
Subjective:    Patient ID: Shelia Mcgee, female    DOB: 04/20/1976, 42 y.o.   MRN: [id number]  HPI  Patient presents today for complete physical.  Immunizations: tetanus 2012, flu shot today Diet: reports "yo yo"  Wt Readings from Last 3 Encounters:  06/03/18 (!) 338 lb (153.3 kg)  03/19/18 (!) 337 lb (152.9 kg)  03/11/18 (!) 337 lb (152.9 kg)   Feels like she needs to cook more and stop eating out.  Exercise: plans to start walking Pap Smear: 05/26/15  Mammogram: due Dental: up to date Vision: last year   Notes some tingling in the left ring finger.    Review of Systems  Constitutional: Negative for unexpected weight change.  HENT: Negative for hearing loss and rhinorrhea.   Eyes: Negative for visual disturbance.  Respiratory: Negative for cough.   Cardiovascular: Negative for leg swelling.  Gastrointestinal: Negative for constipation and diarrhea.  Genitourinary: Negative for dyspareunia, frequency and hematuria.  Musculoskeletal: Negative for arthralgias and myalgias.  Skin: Negative for rash.  Neurological: Negative for headaches.  Hematological: Negative for adenopathy.  Psychiatric/Behavioral:       Denies depression/anxiety   Past Medical History:  Diagnosis Date  . Hypertension      Social History   Socioeconomic History  . Marital status: Single    Spouse name: Not on file  . Number of children: Not on file  . Years of education: Not on file  . Highest education level: Not on file  Occupational History  . Not on file  Social Needs  . Financial resource strain: Not on file  . Food insecurity:    Worry: Not on file    Inability: Not on file  . Transportation needs:    Medical: Not on file    Non-medical: Not on file  Tobacco Use  . Smoking status: Never Smoker  . Smokeless tobacco: Never Used  Substance and Sexual Activity  . Alcohol use: No  . Drug use: No  . Sexual activity: Never    Birth control/protection: Pill  Lifestyle  .  Physical activity:    Days per week: Not on file    Minutes per session: Not on file  . Stress: Not on file  Relationships  . Social connections:    Talks on phone: Not on file    Gets together: Not on file    Attends religious service: Not on file    Active member of club or organization: Not on file    Attends meetings of clubs or organizations: Not on file    Relationship status: Not on file  . Intimate partner violence:    Fear of current or ex partner: Not on file    Emotionally abused: Not on file    Physically abused: Not on file    Forced sexual activity: Not on file  Other Topics Concern  . 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 HIP SURGERY Right 1992   States that bones were slipping. Stabilized it with 3 screws x 1 yr then had screws removed.    Family History  Problem Relation Age of Onset  . Hypertension Mother        died in MVA  in her 40's  . Hypertension Father        had ESRD, die due to infection from his graft  . Diabetes Father   . Stroke Father   . Heart attack Maternal Uncle     No Known Allergies  Current Outpatient Medications on File Prior to Visit  Medication Sig Dispense Refill  . amLODipine (NORVASC) 10 MG tablet TAKE 1 TABLET DAILY 90 tablet 1  . Cholecalciferol (VITAMIN D3) 3000 UNITS TABS Take 1 tablet by mouth daily. 30 tablet   . metoprolol succinate (TOPROL-XL) 25 MG 24 hr tablet TAKE 1 TABLET DAILY 90 tablet 1  . MICROGESTIN FE 1.5/30 1.5-30 MG-MCG tablet TAKE 1 TABLET DAILY 84 tablet 3  . Multiple Vitamins-Minerals (MULTIVITAMIN WITH MINERALS) tablet Take 1 tablet by mouth daily.    . prochlorperazine (COMPAZINE) 10 MG tablet Take 1 tablet (10 mg total) by mouth 2 (two) times daily  as needed for nausea or vomiting. 10 tablet 0  . rizatriptan (MAXALT) 10 MG tablet rizatriptan 10 mg tablet    . topiramate (TOPAMAX) 25 MG tablet topiramate 25 mg tablet     No current facility-administered medications on file prior to visit.     BP 130/90 (BP Location: Left Arm, Patient Position: Sitting, Cuff Size: Large)   Pulse 80   Temp 98.1 F (36.7 C) (Oral)   Resp 16   Ht 5\' 7"  (1.702 m)   Wt (!) 338 lb (153.3 kg)   LMP 05/24/2018   SpO2 98%   BMI 52.94 kg/m        Objective:   Physical Exam  Physical Exam  Constitutional: She is oriented to person, place, and time. She appears well-developed and well-nourished. No distress.  HENT:  Head: Normocephalic and atraumatic.  Right Ear: Tympanic membrane and ear canal normal.  Left Ear: Tympanic membrane and ear canal normal.  Mouth/Throat: Oropharynx is clear and moist.  Eyes: Pupils are equal, round, and reactive to light. No scleral icterus.  Neck: Normal range of motion. No thyromegaly present.  Cardiovascular: Normal rate and regular rhythm.   No murmur heard. Pulmonary/Chest: Effort normal and breath sounds normal. No respiratory distress. He has no wheezes. She has no rales. She exhibits no tenderness.  Abdominal: Soft. Bowel sounds are normal. She exhibits no distension and no mass. There is no tenderness. There is no rebound and no guarding.  Musculoskeletal: She exhibits no edema.  Lymphadenopathy:    She has no cervical adenopathy.  Neurological: [name] and oriented to person, place, and time. She has normal patellar reflexes. She exhibits normal muscle tone. Coordination normal. Neg tinels left Skin: Skin is warm and dry.  Psychiatric: She has a normal mood and affect. Her behavior is normal. Judgment and thought content normal.  Breasts: Examined lying Right: Without masses, retractions, discharge or axillary adenopathy.  Left: Without masses, retractions, discharge or axillary adenopathy.    Inguinal/mons: Normal without inguinal adenopathy  External genitalia: Normal  BUS/Urethra/Skene's glands: Normal  Bladder: Normal  Vagina: Normal  Cervix: Normal  Uterus: normal in size, shape and contour. Midline and mobile  Adnexa/parametria:  Rt: Without masses or tenderness.  Lt: Without masses or tenderness.  Anus and perineum: Normal            Assessment & Plan:   Preventative care-discussed healthy diet, exercise and weight loss.  Pap performed today. Flu shot today.  Refer for mammogram. EKG tracing is personally reviewed.  EKG notes NSR.  No acute  changes.    Finger numbness- mild. ? Carpal tunnel- advised aleve bid x 1 week and call if symptoms worsen or fail to improve.       Assessment & Plan:

## 2018-06-03 NOTE — Addendum Note (Signed)
Addended by: Harley Alto on: 06/03/2018 11:36 AM   Modules accepted: Orders

## 2018-06-03 NOTE — Patient Instructions (Addendum)
Please complete lab work prior to leaving. Continue to work on healthy diet, exercise and weight loss.  

## 2018-06-04 LAB — CYTOLOGY - PAP
Diagnosis: NEGATIVE
HPV (WINDOPATH): NOT DETECTED

## 2018-06-15 ENCOUNTER — Ambulatory Visit (HOSPITAL_BASED_OUTPATIENT_CLINIC_OR_DEPARTMENT_OTHER)
Admission: RE | Admit: 2018-06-15 | Discharge: 2018-06-15 | Disposition: A | Discharge status: Home / Self Care | Payer: BLUE CROSS/BLUE SHIELD | Source: Ambulatory Visit | Attending: Family | Admitting: Family

## 2018-06-15 DIAGNOSIS — Z0000 Encounter for general adult medical examination without abnormal findings: Secondary | ICD-10-CM | POA: Insufficient documentation

## 2018-07-30 ENCOUNTER — Ambulatory Visit: Payer: BLUE CROSS/BLUE SHIELD | Admitting: Family

## 2018-07-30 ENCOUNTER — Encounter: Payer: Self-pay | Admitting: Family

## 2018-07-30 VITALS — BP 116/82 | HR 81 | Temp 98.4°F | Ht 67.0 in | Wt 341.2 lb

## 2018-07-30 DIAGNOSIS — J069 Acute upper respiratory infection, unspecified: Secondary | ICD-10-CM | POA: Diagnosis not present

## 2018-07-30 MED ORDER — BENZONATATE 100 MG PO CAPS: 100.0000 mg | ORAL_CAPSULE | Freq: Three times a day (TID) | ORAL | 0 refills | Status: DC | PRN

## 2018-07-30 MED ORDER — AMOXICILLIN-POT CLAVULANATE 875-125 MG PO TABS: 1.0000 | ORAL_TABLET | Freq: Two times a day (BID) | ORAL | 0 refills | Status: DC

## 2018-07-30 NOTE — Progress Notes (Addendum)
Subjective:    Patient ID: Shelia Mcgee, female    DOB: 02-20-1976, 42 y.o.   MRN: [id number]  HPI  Patient is a 42 yr old female who presents today with chief complaint of sinus pain/pressure. Symptoms began 8 days ago.  Has associated cough, HA, drainage and sneezing. Reports that she has tried advil cold and sinus without significant improvement. Has been drinking breath easy tea.  Reports that she tried some afrin this weekend and "I gave myself a migraine." She denies fever.  She does reports increased fatigue the last few day.   Review of Systems    see HPI  Past Medical History:  Diagnosis Date  . Hypertension      Social History   Socioeconomic History  . Marital status: Single    Spouse name: Not on file  . Number of children: Not on file  . Years of education: Not on file  . Highest education level: Not on file  Occupational History  . Not on file  Social Needs  . Financial resource strain: Not on file  . Food insecurity:    Worry: Not on file    Inability: Not on file  . Transportation needs:    Medical: Not on file    Non-medical: Not on file  Tobacco Use  . Smoking status: Never Smoker  . Smokeless tobacco: Never Used  Substance and Sexual Activity  . Alcohol use: No  . Drug use: No  . Sexual activity: Never    Birth control/protection: Pill  Lifestyle  . Physical activity:    Days per week: Not on file    Minutes per session: Not on file  . Stress: Not on file  Relationships  . Social connections:    Talks on phone: Not on file    Gets together: Not on file    Attends religious service: Not on file    Active member of club or organization: Not on file    Attends meetings of clubs or organizations: Not on file    Relationship status: Not on file  . Intimate partner violence:    Fear of current or ex partner: Not on file    Emotionally abused: Not on file    Physically abused: Not on file    Forced sexual activity: Not on file  Other Topics  Concern  . 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 HIP SURGERY Right 1992   States that bones were slipping. Stabilized it with 3 screws x 1 yr then had screws removed.    Family History  Problem Relation Age of Onset  . Hypertension Mother        died in MVA in her 38's  . Hypertension Father        had ESRD, die due to infection from his graft  . Diabetes Father   . Stroke Father   . Heart attack Maternal Uncle     No Known Allergies  Current Outpatient Medications on File Prior to Visit  Medication Sig Dispense Refill  . amLODipine (NORVASC) 10 MG tablet TAKE 1 TABLET DAILY 90 tablet 1  . Cholecalciferol (VITAMIN D3) 3000 UNITS TABS Take 1 tablet by mouth daily. 30 tablet   . metoprolol succinate (TOPROL-XL) 25  MG 24 hr tablet TAKE 1 TABLET DAILY 90 tablet 1  . Multiple Vitamins-Minerals (MULTIVITAMIN WITH MINERALS) tablet Take 1 tablet by mouth daily.    . prochlorperazine (COMPAZINE) 10 MG tablet Take 1 tablet (10 mg total) by mouth 2 (two) times daily as needed for nausea or vomiting. 10 tablet 0  . rizatriptan (MAXALT) 10 MG tablet rizatriptan 10 mg tablet    . topiramate (TOPAMAX) 50 MG tablet Take 100 mg by mouth at bedtime.     No current facility-administered medications on file prior to visit.     BP 116/82 (BP Location: Right Arm, Patient Position: Sitting, Cuff Size: Large)   Pulse 81   Temp 98.4 F (36.9 C) (Oral)   Ht 5\' 7"  (1.702 m)   Wt (!) 341 lb 3.2 oz (154.8 kg)   LMP 07/17/2018 (Exact Date)   SpO2 100%   BMI 53.44 kg/m    Objective:   Physical Exam  Constitutional: She is oriented to person, place, and time. She appears well-developed and well-nourished.  HENT:  Right Ear: Tympanic membrane and ear canal normal.  Left Ear:  Tympanic membrane and ear canal normal.  Nose: Right sinus exhibits no maxillary sinus tenderness and no frontal sinus tenderness. Left sinus exhibits no maxillary sinus tenderness and no frontal sinus tenderness.  Mouth/Throat: Oropharynx is clear and moist. No oropharyngeal exudate, posterior oropharyngeal edema or posterior oropharyngeal erythema.  Neck: Neck supple.  Cardiovascular: Normal rate, regular rhythm and normal heart sounds.  No murmur heard. Pulmonary/Chest: Effort normal and breath sounds normal. No respiratory distress. She has no wheezes.  Musculoskeletal: She exhibits no edema.  Lymphadenopathy:    She has no cervical adenopathy.  Neurological: [name] and oriented to person, place, and time.  Skin: Skin is warm and dry.  Psychiatric: She has a normal mood and affect. Her behavior is normal. Judgment and thought content normal.          Assessment & Plan:  URI- symptoms most consistent with URI at this point. As we are entering the holiday weekend I have advised pt as follows:  Please begin augmentin if your sinus symptoms worsen or if they are not improved in 2 days.  Call if your symptoms are not improved in 1 week.  You may use tessalon as needed for cough.

## 2018-07-30 NOTE — Patient Instructions (Addendum)
Please begin augmentin if your sinus symptoms worsen or if they are not improved in 2 days.  Call if your symptoms are not improved in 1 week.  You may use tessalon as needed for cough.

## 2018-08-25 ENCOUNTER — Other Ambulatory Visit: Payer: Self-pay | Admitting: Family

## 2018-09-02 ENCOUNTER — Encounter: Payer: Self-pay | Admitting: Family

## 2018-09-02 ENCOUNTER — Ambulatory Visit: Payer: BLUE CROSS/BLUE SHIELD | Admitting: Family

## 2018-09-02 VITALS — BP 124/76 | HR 86 | Temp 98.7°F | Resp 16 | Ht 67.0 in | Wt 345.0 lb

## 2018-09-02 DIAGNOSIS — I10 Essential (primary) hypertension: Secondary | ICD-10-CM

## 2018-09-02 DIAGNOSIS — J309 Allergic rhinitis, unspecified: Secondary | ICD-10-CM

## 2018-09-02 DIAGNOSIS — E559 Vitamin D deficiency, unspecified: Secondary | ICD-10-CM

## 2018-09-02 DIAGNOSIS — G43909 Migraine, unspecified, not intractable, without status migrainosus: Secondary | ICD-10-CM

## 2018-09-02 NOTE — Progress Notes (Signed)
Subjective:    Patient ID: Shelia Mcgee, female    DOB: 06/20/1976, 42 y.o.   MRN: [id number]  HPI   Patient is a 42 yr old female who presents today for follow up.  HTN- blood pressure medications include toprol xl 25mg  and amlodipine 10mg .  Denies side effects from meds. Denies CP/SOB or swelling.  BP Readings from Last 3 Encounters:  09/02/18 124/76  07/30/18 116/82  06/03/18 130/90   Vit d deficiency-  Reports that she continues otc supplement.   Migraine- followed by neurology. No aura. Reports improved control on topamax.  She does report some frontal sinus congestion when she wakes up in the AM. Notes that she is taking an otc 12 hr allergy med.   Review of Systems See  HPI  Past Medical History:  Diagnosis Date  . Hypertension      Social History   Socioeconomic History  . Marital status: Single    Spouse name: Not on file  . Number of children: Not on file  . Years of education: Not on file  . Highest education level: Not on file  Occupational History  . Not on file  Social Needs  . Financial resource strain: Not on file  . Food insecurity:    Worry: Not on file    Inability: Not on file  . Transportation needs:    Medical: Not on file    Non-medical: Not on file  Tobacco Use  . Smoking status: Never Smoker  . Smokeless tobacco: Never Used  Substance and Sexual Activity  . Alcohol use: No  . Drug use: No  . Sexual activity: Never    Birth control/protection: Pill  Lifestyle  . Physical activity:    Days per week: Not on file    Minutes per session: Not on file  . Stress: Not on file  Relationships  . Social connections:    Talks on phone: Not on file    Gets together: Not on file    Attends religious service: Not on file    Active member of club or organization: Not on file    Attends meetings of clubs or organizations: Not on file    Relationship status: Not on file  . Intimate partner violence:    Fear of current or ex partner: Not on  file    Emotionally abused: Not on file    Physically abused: Not on file    Forced sexual activity: Not on file  Other Topics Concern  . 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 HIP SURGERY Right 1992   States that bones were slipping. Stabilized it with 3 screws x 1 yr then had screws removed.    Family History  Problem Relation Age of Onset  . Hypertension Mother        died in MVA in her 3540's  . Hypertension Father        had ESRD, die due to infection from his graft  . Diabetes Father   . Stroke Father   . Heart attack Maternal Uncle     No Known Allergies  Current Outpatient Medications on File Prior to Visit  Medication Sig Dispense Refill  . amLODipine (NORVASC) 10 MG tablet TAKE 1 TABLET DAILY  90 tablet 1  . Cholecalciferol (VITAMIN D3) 3000 UNITS TABS Take 1 tablet by mouth daily. 30 tablet   . metoprolol succinate (TOPROL-XL) 25 MG 24 hr tablet TAKE 1 TABLET DAILY 90 tablet 1  . MICROGESTIN FE 1.5/30 1.5-30 MG-MCG tablet TAKE 1 TABLET DAILY 84 tablet 4  . Multiple Vitamins-Minerals (MULTIVITAMIN WITH MINERALS) tablet Take 1 tablet by mouth daily.    . prochlorperazine (COMPAZINE) 10 MG tablet Take 1 tablet (10 mg total) by mouth 2 (two) times daily as needed for nausea or vomiting. 10 tablet 0  . rizatriptan (MAXALT) 10 MG tablet rizatriptan 10 mg tablet    . topiramate (TOPAMAX) 50 MG tablet Take 100 mg by mouth at bedtime.     No current facility-administered medications on file prior to visit.     BP 124/76 (BP Location: Right Arm, Patient Position: Sitting, Cuff Size: Large)   Pulse 86   Temp 98.7 F (37.1 C) (Oral)   Resp 16   Ht 5\' 7"  (1.702 m)   Wt (!) 345 lb (156.5 kg)   LMP 08/11/2018   SpO2 100%   BMI 54.03 kg/m     Objective:   Physical Exam Constitutional:      Appearance: She is well-developed.  Neck:     Musculoskeletal: Neck supple.     Thyroid: No thyromegaly.  Cardiovascular:     Rate and Rhythm: Normal rate and regular rhythm.     Heart sounds: Normal heart sounds. No murmur.  Pulmonary:     Effort: Pulmonary effort is normal. No respiratory distress.     Breath sounds: Normal breath sounds. No wheezing.  Musculoskeletal:        General: No swelling.  Skin:    General: Skin is warm and dry.  Neurological:     Mental Status: [name] and oriented to person, place, and time.  Psychiatric:        Behavior: Behavior normal.        Thought Content: Thought content normal.        Judgment: Judgment normal.           Assessment & Plan:  Allergic rhinitis- advised pt to change allergy med to clartin10mg  once daily. If no improvement with this can add flonase.  HTN- stable on current meds. Continue same.  She will return for lab work tomorrow which will include bmet.  Vit d deficiency-  Continues otc supplement. Check follow up vit d level.   Migraines- stable. Management per neuro.

## 2018-09-02 NOTE — Patient Instructions (Signed)
Try adding claritin 10mg  once daily for nasal congestion. If this does not help you can also try adding flonase  2 sprays each nostril once daily.

## 2018-09-03 ENCOUNTER — Other Ambulatory Visit (INDEPENDENT_AMBULATORY_CARE_PROVIDER_SITE_OTHER): Payer: BLUE CROSS/BLUE SHIELD

## 2018-09-03 DIAGNOSIS — Z0000 Encounter for general adult medical examination without abnormal findings: Secondary | ICD-10-CM | POA: Diagnosis not present

## 2018-09-03 DIAGNOSIS — E559 Vitamin D deficiency, unspecified: Secondary | ICD-10-CM

## 2018-09-03 LAB — BASIC METABOLIC PANEL
BUN: 14 mg/dL (ref 6–23)
CALCIUM: 8.9 mg/dL (ref 8.4–10.5)
CO2: 23 mEq/L (ref 19–32)
Chloride: 106 mEq/L (ref 96–112)
Creatinine, Ser: 1.02 mg/dL (ref 0.40–1.20)
GFR: 77.07 mL/min (ref >60.00)
Glucose, Bld: 81 mg/dL (ref 70–99)
POTASSIUM: 4.1 meq/L (ref 3.5–5.1)
SODIUM: 137 meq/L (ref 135–145)

## 2018-09-03 LAB — LIPID PANEL
Cholesterol: 166 mg/dL (ref 0–200)
HDL: 57.1 mg/dL (ref >39.00)
LDL Cholesterol: 94 mg/dL (ref 0–99)
NONHDL: 108.85
TRIGLYCERIDES: 72 mg/dL (ref 0.0–149.0)
Total CHOL/HDL Ratio: 3
VLDL: 14.4 mg/dL (ref 0.0–40.0)

## 2018-09-03 LAB — CBC WITH DIFFERENTIAL/PLATELET
BASOS ABS: 0 10*3/uL (ref 0.0–0.1)
Basophils Relative: 0.4 % (ref 0.0–3.0)
EOS ABS: 0.1 10*3/uL (ref 0.0–0.7)
Eosinophils Relative: 0.9 % (ref 0.0–5.0)
HCT: 40.7 % (ref 36.0–46.0)
HEMOGLOBIN: 13.2 g/dL (ref 12.0–15.0)
LYMPHS ABS: 2.7 10*3/uL (ref 0.7–4.0)
Lymphocytes Relative: 38.6 % (ref 12.0–46.0)
MCHC: 32.5 g/dL (ref 30.0–36.0)
MCV: 82.7 fl (ref 78.0–100.0)
MONO ABS: 0.4 10*3/uL (ref 0.1–1.0)
Monocytes Relative: 6.1 % (ref 3.0–12.0)
NEUTROS PCT: 54 % (ref 43.0–77.0)
Neutro Abs: 3.8 10*3/uL (ref 1.4–7.7)
Platelets: 351 10*3/uL (ref 150.0–400.0)
RBC: 4.93 Mil/uL (ref 3.87–5.11)
RDW: 15.5 % (ref 11.5–15.5)
WBC: 7 10*3/uL (ref 4.0–10.5)

## 2018-09-03 LAB — HEPATIC FUNCTION PANEL
ALBUMIN: 4 g/dL (ref 3.5–5.2)
ALK PHOS: 54 U/L (ref 39–117)
ALT: 16 U/L (ref 0–35)
AST: 18 U/L (ref 0–37)
Bilirubin, Direct: 0.1 mg/dL (ref 0.0–0.3)
TOTAL PROTEIN: 7 g/dL (ref 6.0–8.3)
Total Bilirubin: 0.4 mg/dL (ref 0.2–1.2)

## 2018-09-03 LAB — TSH: TSH: 1.39 u[IU]/mL (ref 0.35–4.50)

## 2018-09-08 LAB — VITAMIN D 1,25 DIHYDROXY
VITAMIN D 1, 25 (OH) TOTAL: 64 pg/mL (ref 18–72)
Vitamin D3 1, 25 (OH)2: 64 pg/mL

## 2018-09-08 LAB — HIV ANTIBODY (ROUTINE TESTING W REFLEX): HIV: NONREACTIVE

## 2018-11-05 ENCOUNTER — Other Ambulatory Visit: Payer: Self-pay | Admitting: Family

## 2018-11-08 ENCOUNTER — Ambulatory Visit: Payer: BLUE CROSS/BLUE SHIELD | Admitting: Family

## 2018-11-11 ENCOUNTER — Ambulatory Visit: Payer: BLUE CROSS/BLUE SHIELD | Admitting: Family

## 2019-01-08 ENCOUNTER — Encounter: Payer: Self-pay | Admitting: Family

## 2019-01-10 ENCOUNTER — Telehealth (INDEPENDENT_AMBULATORY_CARE_PROVIDER_SITE_OTHER): Payer: BLUE CROSS/BLUE SHIELD | Admitting: Family

## 2019-01-10 ENCOUNTER — Other Ambulatory Visit: Payer: Self-pay

## 2019-01-10 ENCOUNTER — Telehealth: Payer: Self-pay | Admitting: Family

## 2019-01-10 DIAGNOSIS — I10 Essential (primary) hypertension: Secondary | ICD-10-CM | POA: Diagnosis not present

## 2019-01-10 DIAGNOSIS — Z30011 Encounter for initial prescription of contraceptive pills: Secondary | ICD-10-CM | POA: Diagnosis not present

## 2019-01-10 MED ORDER — MICROGESTIN FE 1.5/30 1.5-30 MG-MCG PO TABS: 1.0000 | ORAL_TABLET | Freq: Every day | ORAL | 1 refills | Status: DC

## 2019-01-10 MED ORDER — METOPROLOL SUCCINATE ER 25 MG PO TB24: 25.0000 mg | ORAL_TABLET | Freq: Every day | ORAL | 0 refills | Status: DC

## 2019-01-10 MED ORDER — AMLODIPINE BESYLATE 10 MG PO TABS: 10.0000 mg | ORAL_TABLET | Freq: Every day | ORAL | 0 refills | Status: DC

## 2019-01-10 NOTE — Telephone Encounter (Signed)
Called but no answer lvm 

## 2019-01-10 NOTE — Progress Notes (Signed)
Virtual Visit via Video Note  I connected with Shelia Mcgee on 01/10/19 at  2:20 PM EDT by a video enabled telemedicine application and verified that I am speaking with the correct person using two identifiers. This visit type was conducted due to national recommendations for restrictions regarding the COVID-19 Pandemic (e.g. social distancing).  This format is felt to be most appropriate for this patient at this time.   I discussed the limitations of evaluation and management by telemedicine and the availability of in person appointments. The patient expressed understanding and agreed to proceed.  Only the patient and myself were on today's video visit. The patient was at home and I was in my office at the time of today's visit.   History of Present Illness:   HTN- reports that she has taken her blood pressure recently. Feels well on current mds. BP Readings from Last 3 Encounters:  09/02/18 124/76  07/30/18 116/82  06/03/18 130/90   Continues vit D otc supplement.   Migraines- reports improved   She added claritin for allergic rhinitis with good results.   LMP was about 1 month ago.  4/19.  She is currently using nothing.  For birth control.  Reports that she had "one slip up last weekend."   Reports weight is down to 300 Wt Readings from Last 3 Encounters:  09/02/18 (!) 345 lb (156.5 kg)  07/30/18 (!) 341 lb 3.2 oz (154.8 kg)  06/03/18 (!) 338 lb (153.3 kg)   Reports that she cut back on "everything."  More fruits/veggies.   Observations/Objective:   Gen: Awake, alert, no acute distress Resp: Breathing is even and non-labored Psych: calm/pleasant demeanor Neuro: Alert and Oriented x 3, + facial symmetry, speech is clear.    Assessment and Plan:Follow Up Instructions:  Contraceptive management-  rx sent for microgestin.  She will do pregnancy test the day her period starts and if negative start microgestin that day. Advised to use condoms in the meantime for  contraception.   HTN- she will check her blood pressure and send me her readings via mychart. Continue current meds.     I discussed the assessment and treatment plan with the patient. The patient was provided an opportunity to ask questions and all were answered. The patient agreed with the plan and demonstrated an understanding of the instructions.   The patient was advised to call back or seek an in-person evaluation if the symptoms worsen or if the condition fails to improve as anticipated.    Lemont Fillers, NP

## 2019-01-10 NOTE — Telephone Encounter (Signed)
Please contact pt to schedule an appointment for contraceptive management. Also, she has an appointment at the end of June for her blood pressure. We could combine both of those to a virtual visit if she would like.

## 2019-01-10 NOTE — Telephone Encounter (Signed)
See my chart message

## 2019-01-14 ENCOUNTER — Encounter: Payer: Self-pay | Admitting: Family

## 2019-03-04 ENCOUNTER — Ambulatory Visit: Payer: BLUE CROSS/BLUE SHIELD | Admitting: Family

## 2019-04-14 ENCOUNTER — Ambulatory Visit: Payer: BC Managed Care – PPO | Admitting: Family

## 2019-04-14 NOTE — Progress Notes (Signed)
Spoke to patient. She rescheduled for a face to face next week.

## 2019-04-17 ENCOUNTER — Other Ambulatory Visit: Payer: Self-pay

## 2019-04-18 ENCOUNTER — Ambulatory Visit: Payer: BC Managed Care – PPO | Admitting: Family

## 2019-04-18 ENCOUNTER — Encounter: Payer: Self-pay | Admitting: Family

## 2019-04-18 VITALS — BP 132/88 | HR 69 | Temp 97.8°F | Resp 16 | Ht 67.0 in | Wt 300.6 lb

## 2019-04-18 DIAGNOSIS — M7661 Achilles tendinitis, right leg: Secondary | ICD-10-CM

## 2019-04-18 DIAGNOSIS — I10 Essential (primary) hypertension: Secondary | ICD-10-CM | POA: Diagnosis not present

## 2019-04-18 DIAGNOSIS — G43909 Migraine, unspecified, not intractable, without status migrainosus: Secondary | ICD-10-CM

## 2019-04-18 DIAGNOSIS — A084 Viral intestinal infection, unspecified: Secondary | ICD-10-CM | POA: Diagnosis not present

## 2019-04-18 NOTE — Progress Notes (Signed)
Subjective:    Patient ID: Shelia Mcgee, female    DOB: 02/07/1976, 43 y.o.   MRN: [id number]  HPI   Patient is a 43 yr old female who presents today for follow up.  HTN- maintained on metoprolol 25mg  and amlodipine 10mg .    BP Readings from Last 3 Encounters:  04/18/19 (!) 121/93  09/02/18 124/76  07/30/18 116/82   Abdominal pain-   Post prandial abdominal discomfort x 5 days. Reports lower abdominal pain with nausea.  She reports pain "like a burning pit" in her stomach. Reports tat she is moving her bowels QOD- to every 3 days which is normal for her. BM's are soft. Mild nausea/no vomiting.   Right achilles pain- reports tha tthis has been on and off for a while.  Reports pain is worsened by walking.  She has been using aleve with some improvement.    Migraines- no recent symptoms, uses maxalt prn.  Continues topamax, this is being prescribed by neurology. .     Review of Systems    see HPI  Past Medical History:  Diagnosis Date  . Hypertension      Social History   Socioeconomic History  . Marital status: Single    Spouse name: Not on file  . Number of children: Not on file  . Years of education: Not on file  . Highest education level: Not on file  Occupational History  . Not on file  Social Needs  . Financial resource strain: Not on file  . Food insecurity    Worry: Not on file    Inability: Not on file  . Transportation needs    Medical: Not on file    Non-medical: Not on file  Tobacco Use  . Smoking status: Never Smoker  . Smokeless tobacco: Never Used  Substance and Sexual Activity  . Alcohol use: No  . Drug use: No  . Sexual activity: Never    Birth control/protection: Pill  Lifestyle  . Physical activity    Days per week: Not on file    Minutes per session: Not on file  . Stress: Not on file  Relationships  . Social Musicianconnections    Talks on phone: Not on file    Gets together: Not on file    Attends religious service: Not on file   Active member of club or organization: Not on file    Attends meetings of clubs or organizations: Not on file    Relationship status: Not on file  . Intimate partner violence    Fear of current or ex partner: Not on file    Emotionally abused: Not on file    Physically abused: Not on file    Forced sexual activity: Not on file  Other Topics Concern  . 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 HIP SURGERY Right 1992   States that bones were slipping. Stabilized it with 3 screws x 1 yr then had screws removed.    Family History  Problem Relation Age of Onset  . Hypertension Mother        died in MVA in her 5640's  . Hypertension Father        had ESRD, die due to infection from his graft  .  Diabetes Father   . Stroke Father   . Heart attack Maternal Uncle     No Known Allergies  Current Outpatient Medications on File Prior to Visit  Medication Sig Dispense Refill  . amLODipine (NORVASC) 10 MG tablet Take 1 tablet (10 mg total) by mouth daily. 90 tablet 0  . Cholecalciferol (VITAMIN D3) 3000 UNITS TABS Take 1 tablet by mouth daily. 30 tablet   . metoprolol succinate (TOPROL-XL) 25 MG 24 hr tablet Take 1 tablet (25 mg total) by mouth daily. 90 tablet 0  . MICROGESTIN FE 1.5/30 1.5-30 MG-MCG tablet Take 1 tablet by mouth daily. 84 tablet 1  . Multiple Vitamins-Minerals (MULTIVITAMIN WITH MINERALS) tablet Take 1 tablet by mouth daily.    . rizatriptan (MAXALT) 10 MG tablet rizatriptan 10 mg tablet    . topiramate (TOPAMAX) 50 MG tablet Take 100 mg by mouth at bedtime.     No current facility-administered medications on file prior to visit.     BP (!) 121/93 (BP Location: Right Arm, Patient Position: Sitting, Cuff Size: Large)   Pulse 69   Temp 97.8 F (36.6 C)  (Temporal)   Resp 16   Ht 5\' 7"  (1.702 m)   Wt (!) 300 lb 9.6 oz (136.4 kg)   SpO2 100%   BMI 47.08 kg/m    Objective:   Physical Exam Constitutional:      Appearance: She is well-developed.  Neck:     Musculoskeletal: Neck supple.     Thyroid: No thyromegaly.  Cardiovascular:     Rate and Rhythm: Normal rate and regular rhythm.     Heart sounds: Normal heart sounds. No murmur.  Pulmonary:     Effort: Pulmonary effort is normal. No respiratory distress.     Breath sounds: Normal breath sounds. No wheezing.  Abdominal:     General: Bowel sounds are normal.     Tenderness: There is no abdominal tenderness.  Skin:    General: Skin is warm and dry.  Neurological:     Mental Status: [name] and oriented to person, place, and time.  Psychiatric:        Behavior: Behavior normal.        Thought Content: Thought content normal.        Judgment: Judgment normal.           Assessment & Plan:  Viral gastroenteritis- most consistent with a viral gastroenteritis.  Patient has no abdominal tenderness on exam.  She is tolerating p.o. she is afebrile.  Bowel movements are normal nausea is mild.  I have advised her to follow a bland diet advance as tolerated.  Go to the ER should she develop severe worsening abdominal pain or inability to keep down food or liquid.  Will obtain complete metabolic panel and CBC.  Hypertension-initial diastolic blood pressure was mildly elevated.  Follow-up blood pressures better.  Continue current meds.  Plan to recheck blood pressure in 4 months.  Achilles tendinitis-will refer to sports medicine for further evaluation.  Migraines-stable on current regimen.  Management per neurology.

## 2019-04-22 ENCOUNTER — Ambulatory Visit: Payer: BC Managed Care – PPO | Admitting: Family

## 2019-06-11 ENCOUNTER — Other Ambulatory Visit: Payer: Self-pay | Admitting: Family

## 2019-08-19 ENCOUNTER — Ambulatory Visit: Payer: BC Managed Care – PPO | Admitting: Family

## 2019-08-20 ENCOUNTER — Other Ambulatory Visit: Payer: Self-pay

## 2019-08-20 ENCOUNTER — Ambulatory Visit (INDEPENDENT_AMBULATORY_CARE_PROVIDER_SITE_OTHER): Payer: BC Managed Care – PPO | Admitting: Family

## 2019-08-20 ENCOUNTER — Encounter: Payer: Self-pay | Admitting: Family

## 2019-08-20 VITALS — BP 131/92 | HR 71 | Temp 96.0°F | Resp 16 | Ht 67.0 in | Wt 310.0 lb

## 2019-08-20 DIAGNOSIS — G43909 Migraine, unspecified, not intractable, without status migrainosus: Secondary | ICD-10-CM | POA: Diagnosis not present

## 2019-08-20 DIAGNOSIS — E559 Vitamin D deficiency, unspecified: Secondary | ICD-10-CM | POA: Diagnosis not present

## 2019-08-20 DIAGNOSIS — I10 Essential (primary) hypertension: Secondary | ICD-10-CM

## 2019-08-20 MED ORDER — METOPROLOL SUCCINATE ER 50 MG PO TB24: 50.0000 mg | ORAL_TABLET | Freq: Every day | ORAL | 1 refills | Status: DC

## 2019-08-20 NOTE — Progress Notes (Signed)
Subjective:    Patient ID: Shelia Mcgee, female    DOB: [birth year], 43 y.o.   MRN: [id number]  HPI  Patient is a 43 yr old female who presents today for follow up.  HTN- maintained on amlodipine and metoprolol. Denies any issues with medications. Denies LE edema. BP Readings from Last 3 Encounters:  08/20/19 (!) 131/92  04/18/19 132/88  09/02/18 124/76   Vit D deficiency- maintained on vitamin D 3.  Migraines- maintained on maxalt prn and topamax. Reports that she had an episode 2 weeks ago, otherwise regimen has been helpful.   Past Medical History:  Diagnosis Date  . Hypertension      Social History   Socioeconomic History  . Marital status: Single    Spouse name: Not on file  . Number of children: Not on file  . Years of education: Not on file  . Highest education level: Not on file  Occupational History  . Not on file  Tobacco Use  . Smoking status: Never Smoker  . Smokeless tobacco: Never Used  Substance and Sexual Activity  . Alcohol use: No  . Drug use: No  . Sexual activity: Never    Birth control/protection: Pill  Other Topics Concern  . 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 Difficulty of Paying Living Expenses: Not on file  Food Insecurity:   . Worried About Charity fundraiser in the Last Year: Not on file  . Ran Out of Food in the Last Year: Not on file  Transportation Needs:   . Lack of Transportation (Medical): Not on file  . Lack of Transportation (Non-Medical): Not on file  Physical Activity:   . Days of Exercise per Week: Not on file  . Minutes of Exercise per Session: Not on file  Stress:   . Feeling of Stress : Not on file  Social Connections:   . Frequency of Communication with Friends and Family: Not on  file  . Frequency of Social Gatherings with Friends and Family: Not on file  . Attends Religious Services: Not on file  . Active Member of Clubs or Organizations: Not on file  . Attends Archivist Meetings: Not on file  . Marital Status: Not on file  Intimate Partner Violence:   . Fear of Current or Ex-Partner: Not on file  . Emotionally Abused: Not on file  . Physically Abused: Not on file  . Sexually Abused: Not on file    Past Surgical History:  Procedure Laterality Date  . HIP SURGERY Right 1992   States that bones were slipping. Stabilized it with 3 screws x 1 yr then had screws removed.    Family History  Problem Relation Age of Onset  . Hypertension Mother        died in [place] in her 46's  . Hypertension Father        had ESRD, die due to infection from his graft  . Diabetes Father   . Stroke Father   . Heart attack Maternal Uncle     No Known Allergies  Current Outpatient Medications on File Prior to Visit  Medication Sig Dispense Refill  . amLODipine (NORVASC) 10 MG tablet TAKE 1 TABLET DAILY 90 tablet 3  .  Cholecalciferol (VITAMIN D3) 3000 UNITS TABS Take 1 tablet by mouth daily. 30 tablet   . metoprolol succinate (TOPROL-XL) 25 MG 24 hr tablet TAKE 1 TABLET DAILY 90 tablet 3  . MICROGESTIN FE 1.5/30 1.5-30 MG-MCG tablet Take 1 tablet by mouth daily. 84 tablet 1  . Multiple Vitamins-Minerals (MULTIVITAMIN WITH MINERALS) tablet Take 1 tablet by mouth daily.    . rizatriptan (MAXALT) 10 MG tablet rizatriptan 10 mg tablet    . topiramate (TOPAMAX) 50 MG tablet Take 100 mg by mouth at bedtime.     No current facility-administered medications on file prior to visit.    BP (!) 131/92 (BP Location: Right Arm, Patient Position: Sitting, Cuff Size: Large)   Pulse 71   Temp (!) 96 F (35.6 C) (Temporal)   Resp 16   Ht 5\' 7"  (1.702 m)   Wt (!) 310 lb (140.6 kg)   SpO2 100%   BMI 48.55 kg/m     Review of Systems    see HPI  Past Medical History:    Diagnosis Date  . Hypertension      Social History   Socioeconomic History  . Marital status: Single    Spouse name: Not on file  . Number of children: Not on file  . Years of education: Not on file  . Highest education level: Not on file  Occupational History  . Not on file  Tobacco Use  . Smoking status: Never Smoker  . Smokeless tobacco: Never Used  Substance and Sexual Activity  . Alcohol use: No  . Drug use: No  . Sexual activity: Never    Birth control/protection: Pill  Other Topics Concern  . Not on file  Social History Narrative   Lives alone, single   Works for - [employer]   Completed bachelors in Clinical biochemist   Enjoys bowling, spending time with friends, church   Social Determinants of Health   Financial Resource Strain:   . Difficulty of Paying Living Expenses: Not on file  Food Insecurity:   . Worried About [employer] in the Last Year: Not on file  . Ran Out of Food in the Last Year: Not on file  Transportation Needs:   . Lack of Transportation (Medical): Not on file  . Lack of Transportation (Non-Medical): Not on file  Physical Activity:   . Days of Exercise per Week: Not on file  . Minutes of Exercise per Session: Not on file  Stress:   . Feeling of Stress : Not on file  Social Connections:   . Frequency of Communication with Friends and Family: Not on file  . Frequency of Social Gatherings with Friends and Family: Not on file  . Attends Religious Services: Not on file  . Active Member of Clubs or Organizations: Not on file  . Attends Programme researcher, broadcasting/film/video Meetings: Not on file  . Marital Status: Not on file  Intimate Partner Violence:   . Fear of Current or Ex-Partner: Not on file  . Emotionally Abused: Not on file  . Physically Abused: Not on file  . Sexually Abused: Not on file    Past Surgical History:  Procedure Laterality Date  . HIP SURGERY Right 1992   States that bones were slipping. Stabilized  it with 3 screws x 1 yr then had screws removed.    Family History  Problem Relation Age of Onset  . Hypertension Mother        died in MVA  in her 40's  . Hypertension Father        had ESRD, die due to infection from his graft  . Diabetes Father   . Stroke Father   . Heart attack Maternal Uncle     No Known Allergies  Current Outpatient Medications on File Prior to Visit  Medication Sig Dispense Refill  . amLODipine (NORVASC) 10 MG tablet TAKE 1 TABLET DAILY 90 tablet 3  . Cholecalciferol (VITAMIN D3) 3000 UNITS TABS Take 1 tablet by mouth daily. 30 tablet   . metoprolol succinate (TOPROL-XL) 25 MG 24 hr tablet TAKE 1 TABLET DAILY 90 tablet 3  . MICROGESTIN FE 1.5/30 1.5-30 MG-MCG tablet Take 1 tablet by mouth daily. 84 tablet 1  . Multiple Vitamins-Minerals (MULTIVITAMIN WITH MINERALS) tablet Take 1 tablet by mouth daily.    . rizatriptan (MAXALT) 10 MG tablet rizatriptan 10 mg tablet    . topiramate (TOPAMAX) 50 MG tablet Take 100 mg by mouth at bedtime.     No current facility-administered medications on file prior to visit.    BP (!) 131/92 (BP Location: Right Arm, Patient Position: Sitting, Cuff Size: Large)   Pulse 71   Temp (!) 96 F (35.6 C) (Temporal)   Resp 16   Ht 5\' 7"  (1.702 m)   Wt (!) 310 lb (140.6 kg)   SpO2 100%   BMI 48.55 kg/m    Objective:   Physical Exam Constitutional:      Appearance: She is well-developed.  Neck:     Thyroid: No thyromegaly.  Cardiovascular:     Rate and Rhythm: Normal rate and regular rhythm.     Heart sounds: Normal heart sounds. No murmur.  Pulmonary:     Effort: Pulmonary effort is normal. No respiratory distress.     Breath sounds: Normal breath sounds. No wheezing.  Musculoskeletal:     Cervical back: Neck supple.  Skin:    General: Skin is warm and dry.  Neurological:     Mental Status: [name] and oriented to person, place, and time.  Psychiatric:        Behavior: Behavior normal.        Thought  Content: Thought content normal.        Judgment: Judgment normal.           Assessment & Plan:  HTN- dbp mildly elevated. Will increase toprol xl 25mg  to 50mg . Continue amlodipine.  Migraines- stable, followed by neurology.  Vitamin D deficiency- continues vit d supplement.   This visit occurred during the SARS-CoV-2 public health emergency.  Safety protocols were in place, including screening questions prior to the visit, additional usage of staff PPE, and extensive cleaning of exam room while observing appropriate contact time as indicated for disinfecting solutions.

## 2019-08-20 NOTE — Patient Instructions (Signed)
Please increase your metoprolol from 25mg  once daily to 50mg  once daily.

## 2019-08-28 ENCOUNTER — Other Ambulatory Visit: Payer: Self-pay | Admitting: Family

## 2019-09-19 ENCOUNTER — Ambulatory Visit: Payer: BC Managed Care – PPO | Admitting: Family

## 2019-12-22 ENCOUNTER — Encounter: Payer: Self-pay | Admitting: Family

## 2019-12-22 ENCOUNTER — Ambulatory Visit (INDEPENDENT_AMBULATORY_CARE_PROVIDER_SITE_OTHER): Payer: 59 | Admitting: Family

## 2019-12-22 ENCOUNTER — Other Ambulatory Visit: Payer: Self-pay

## 2019-12-22 VITALS — BP 126/79 | HR 72 | Temp 97.7°F | Resp 16 | Ht 67.0 in | Wt 304.0 lb

## 2019-12-22 DIAGNOSIS — Z0000 Encounter for general adult medical examination without abnormal findings: Secondary | ICD-10-CM

## 2019-12-22 DIAGNOSIS — I10 Essential (primary) hypertension: Secondary | ICD-10-CM | POA: Diagnosis not present

## 2019-12-22 DIAGNOSIS — Z114 Encounter for screening for human immunodeficiency virus [HIV]: Secondary | ICD-10-CM

## 2019-12-22 LAB — CBC WITH DIFFERENTIAL/PLATELET
Basophils Absolute: 0 10*3/uL (ref 0.0–0.1)
Basophils Relative: 0.6 % (ref 0.0–3.0)
Eosinophils Absolute: 0 10*3/uL (ref 0.0–0.7)
Eosinophils Relative: 0.4 % (ref 0.0–5.0)
HCT: 38.7 % (ref 36.0–46.0)
Hemoglobin: 12.7 g/dL (ref 12.0–15.0)
Lymphocytes Relative: 41.1 % (ref 12.0–46.0)
Lymphs Abs: 2.6 10*3/uL (ref 0.7–4.0)
MCHC: 32.9 g/dL (ref 30.0–36.0)
MCV: 83.7 fl (ref 78.0–100.0)
Monocytes Absolute: 0.5 10*3/uL (ref 0.1–1.0)
Monocytes Relative: 7.3 % (ref 3.0–12.0)
Neutro Abs: 3.2 10*3/uL (ref 1.4–7.7)
Neutrophils Relative %: 50.6 % (ref 43.0–77.0)
Platelets: 351 10*3/uL (ref 150.0–400.0)
RBC: 4.63 Mil/uL (ref 3.87–5.11)
RDW: 15.3 % (ref 11.5–15.5)
WBC: 6.3 10*3/uL (ref 4.0–10.5)

## 2019-12-22 LAB — HEPATIC FUNCTION PANEL
ALT: 10 U/L (ref 0–35)
AST: 14 U/L (ref 0–37)
Albumin: 3.9 g/dL (ref 3.5–5.2)
Alkaline Phosphatase: 39 U/L (ref 39–117)
Bilirubin, Direct: 0.1 mg/dL (ref 0.0–0.3)
Total Bilirubin: 0.6 mg/dL (ref 0.2–1.2)
Total Protein: 6.7 g/dL (ref 6.0–8.3)

## 2019-12-22 LAB — LIPID PANEL
Cholesterol: 169 mg/dL (ref 0–200)
HDL: 48.6 mg/dL (ref >39.00)
LDL Cholesterol: 104 mg/dL — ABNORMAL HIGH (ref 0–99)
NonHDL: 120.41
Total CHOL/HDL Ratio: 3
Triglycerides: 83 mg/dL (ref 0.0–149.0)
VLDL: 16.6 mg/dL (ref 0.0–40.0)

## 2019-12-22 LAB — BASIC METABOLIC PANEL
BUN: 11 mg/dL (ref 6–23)
CO2: 27 mEq/L (ref 19–32)
Calcium: 9.1 mg/dL (ref 8.4–10.5)
Chloride: 103 mEq/L (ref 96–112)
Creatinine, Ser: 1.04 mg/dL (ref 0.40–1.20)
GFR: 70.45 mL/min (ref >60.00)
Glucose, Bld: 77 mg/dL (ref 70–99)
Potassium: 4.3 mEq/L (ref 3.5–5.1)
Sodium: 138 mEq/L (ref 135–145)

## 2019-12-22 LAB — TSH: TSH: 1.03 u[IU]/mL (ref 0.35–4.50)

## 2019-12-22 MED ORDER — METOPROLOL SUCCINATE ER 50 MG PO TB24: 50.0000 mg | ORAL_TABLET | Freq: Every day | ORAL | 1 refills | Status: DC

## 2019-12-22 MED ORDER — MICROGESTIN FE 1.5/30 1.5-30 MG-MCG PO TABS: 1.0000 | ORAL_TABLET | Freq: Every day | ORAL | 3 refills | Status: DC

## 2019-12-22 MED ORDER — AMLODIPINE BESYLATE 10 MG PO TABS: 10.0000 mg | ORAL_TABLET | Freq: Every day | ORAL | 1 refills | Status: DC

## 2019-12-22 NOTE — Patient Instructions (Addendum)
Eliminate sugared beverages. Try to cut back one evening snacking- if you do snack use healthy snacks. Walk 30 minutes 5 days a week. Eat a healthy breakfast each day. Schedule a routine dental appointment.

## 2019-12-22 NOTE — Progress Notes (Signed)
Subjective:    Patient ID: Shelia Mcgee, female    DOB: 1975/12/21, 44 y.o.   MRN: [id number]  HPI  Patient is a 44 yr old female who presents today for cpx.  Immunizations: tetanus 2012 Diet: fair Wt Readings from Last 3 Encounters:  12/22/19 (!) 304 lb (137.9 kg)  08/20/19 (!) 310 lb (140.6 kg)  04/18/19 (!) 300 lb 9.6 oz (136.4 kg)   Breakfast  Does not eat breakfast (water)  Lunch  Salad/sandwich/leftovers  Occasionally snacks in the afternoon when she  Dinner- sometimes goes out, sometimes stays home  Drinks Geophysical data processor- about 4 8 oz juices   Evenings- some snacking- chips, crackers/peanut butter,    Exercise: sometimes Pap Smear: 2019 Mammogram: 2019 Vision: up to date Dental: due     Review of Systems  Constitutional: Negative for unexpected weight change.  HENT: Negative for hearing loss and rhinorrhea.   Eyes: Negative for visual disturbance.  Respiratory: Negative for cough and shortness of breath.   Cardiovascular: Negative for chest pain.  Gastrointestinal: Negative for blood in stool, constipation and diarrhea.  Genitourinary: Negative for dysuria, frequency, hematuria and menstrual problem.  Musculoskeletal: Positive for arthralgias. Negative for myalgias.       Bilateral shoulder pain.   Skin: Negative for rash.  Neurological: Negative for headaches.  Hematological: Negative for adenopathy.  Psychiatric/Behavioral:       Denies depression/anxiety   Past Medical History:  Diagnosis Date  . Hypertension      Social History   Socioeconomic History  . Marital status: Single    Spouse name: Not on file  . Number of children: Not on file  . Years of education: Not on file  . Highest education level: Not on file  Occupational History  . Not on file  Tobacco Use  . Smoking status: Never Smoker  . Smokeless tobacco: Never Used  Substance and Sexual Activity  . Alcohol use: No  . Drug use: No  . Sexual activity: Never    Birth  control/protection: Pill  Other Topics Concern  . 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 Difficulty of Paying Living Expenses:   Food Insecurity:   . Worried About Programme researcher, broadcasting/film/video in the Last Year:   . Barista in the Last Year:   Transportation Needs:   . Freight forwarder (Medical):   Marland Kitchen Lack of Transportation (Non-Medical):   Physical Activity:   . Days of Exercise per Week:   . Minutes of Exercise per Session:   Stress:   . Feeling of Stress :   Social Connections:   . Frequency of Communication with Friends and Family:   . Frequency of Social Gatherings with Friends and Family:   . Attends Religious Services:   . Active Member of Clubs or Organizations:   . Attends Banker Meetings:   Marland Kitchen Marital Status:   Intimate Partner Violence:   . Fear of Current or Ex-Partner:   . Emotionally Abused:   Marland Kitchen Physically Abused:   . Sexually Abused:     Past Surgical History:  Procedure Laterality Date  . HIP SURGERY Right 1992   States that bones were slipping. Stabilized it with 3 screws x 1 yr  then had screws removed.    Family History  Problem Relation Age of Onset  . Hypertension Mother        died in MVA in her 36's  . Hypertension Father        had ESRD, die due to infection from his graft  . Diabetes Father   . Stroke Father   . Heart attack Maternal Uncle     No Known Allergies  Current Outpatient Medications on File Prior to Visit  Medication Sig Dispense Refill  . amLODipine (NORVASC) 10 MG tablet TAKE 1 TABLET DAILY 90 tablet 3  . Cholecalciferol (VITAMIN D3) 3000 UNITS TABS Take 1 tablet by mouth daily. 30 tablet   . metoprolol succinate (TOPROL XL) 50 MG 24 hr tablet Take 1 tablet (50 mg total) by mouth  daily. Take with or immediately following a meal. 90 tablet 1  . MICROGESTIN FE 1.5/30 1.5-30 MG-MCG tablet TAKE 1 TABLET DAILY 84 tablet 3  . Multiple Vitamins-Minerals (MULTIVITAMIN WITH MINERALS) tablet Take 1 tablet by mouth daily.    . rizatriptan (MAXALT) 10 MG tablet rizatriptan 10 mg tablet    . topiramate (TOPAMAX) 100 MG tablet Take 100 mg by mouth daily.     No current facility-administered medications on file prior to visit.    BP 126/79 (BP Location: Right Arm, Patient Position: Sitting, Cuff Size: Large)   Pulse 72   Temp 97.7 F (36.5 C) (Temporal)   Resp 16   Ht 5\' 7"  (1.702 m)   Wt (!) 304 lb (137.9 kg)   SpO2 100%   BMI 47.61 kg/m       Objective:   Physical Exam  Physical Exam  Constitutional: She is oriented to person, place, and time. She appears morbidly obes. No distress.  HENT:  Head: Normocephalic and atraumatic.  Right Ear: Tympanic membrane and ear canal normal.  Left Ear: Tympanic membrane and ear canal normal.  Mouth/Throat: Not examined- pt wearing mask Eyes: Pupils are equal, round, and reactive to light. No scleral icterus.  Neck: Normal range of motion. No thyromegaly present.  Cardiovascular: Normal rate and regular rhythm.   No murmur heard. Pulmonary/Chest: Effort normal and breath sounds normal. No respiratory distress. He has no wheezes. She has no rales. She exhibits no tenderness.  Abdominal: Soft. Bowel sounds are normal. She exhibits no distension and no mass. There is no tenderness. There is no rebound and no guarding.  Musculoskeletal: She exhibits no edema.  Lymphadenopathy:    She has no cervical adenopathy.  Neurological: [name] and oriented to person, place, and time. She has normal patellar reflexes. She exhibits normal muscle tone. Coordination normal.  Skin: Skin is warm and dry.  Psychiatric: She has a normal mood and affect. Her behavior is normal. Judgment and thought content normal.  Breast/pelvis:  deferred           Assessment & Plan:   Preventative care- discussed healthy diet, exercise, weight loss. We also discussed COVID-19 vaccine and I encouraged her to get this.  Refer for mammogram, routine lab work. Pap up to date. She is requesting HIV testing.   Pt counseled as follows:  Eliminate sugared beverages. Try to cut back one evening snacking- if you do snack use healthy snacks. Walk 30 minutes 5 days a week. Eat a healthy breakfast each day. Schedule a routine dental appointment.   This visit occurred during the SARS-CoV-2 public health emergency.  Safety protocols were in place, including screening questions  prior to the visit, additional usage of staff PPE, and extensive cleaning of exam room while observing appropriate contact time as indicated for disinfecting solutions.       Assessment & Plan:

## 2019-12-23 LAB — HIV ANTIBODY (ROUTINE TESTING W REFLEX): HIV 1&2 Ab, 4th Generation: NONREACTIVE

## 2019-12-25 ENCOUNTER — Encounter: Payer: Self-pay | Admitting: Family

## 2019-12-26 MED ORDER — AMLODIPINE BESYLATE 10 MG PO TABS: 10.0000 mg | ORAL_TABLET | Freq: Every day | ORAL | 1 refills | Status: DC

## 2019-12-26 MED ORDER — METOPROLOL SUCCINATE ER 50 MG PO TB24: 50.0000 mg | ORAL_TABLET | Freq: Every day | ORAL | 1 refills | Status: DC

## 2019-12-26 MED ORDER — MICROGESTIN FE 1.5/30 1.5-30 MG-MCG PO TABS: 1.0000 | ORAL_TABLET | Freq: Every day | ORAL | 3 refills | Status: DC

## 2019-12-29 ENCOUNTER — Ambulatory Visit (HOSPITAL_BASED_OUTPATIENT_CLINIC_OR_DEPARTMENT_OTHER)
Admission: RE | Admit: 2019-12-29 | Discharge: 2019-12-29 | Disposition: A | Discharge status: Home / Self Care | Payer: 59 | Source: Ambulatory Visit | Attending: Family | Admitting: Family

## 2019-12-29 ENCOUNTER — Other Ambulatory Visit: Payer: Self-pay

## 2019-12-29 DIAGNOSIS — Z1231 Encounter for screening mammogram for malignant neoplasm of breast: Secondary | ICD-10-CM | POA: Insufficient documentation

## 2019-12-29 DIAGNOSIS — Z0000 Encounter for general adult medical examination without abnormal findings: Secondary | ICD-10-CM

## 2020-02-04 ENCOUNTER — Encounter (HOSPITAL_BASED_OUTPATIENT_CLINIC_OR_DEPARTMENT_OTHER): Payer: Self-pay | Admitting: Emergency Medicine

## 2020-02-04 ENCOUNTER — Emergency Department (HOSPITAL_BASED_OUTPATIENT_CLINIC_OR_DEPARTMENT_OTHER): Payer: 59

## 2020-02-04 ENCOUNTER — Other Ambulatory Visit: Payer: Self-pay

## 2020-02-04 ENCOUNTER — Emergency Department (HOSPITAL_BASED_OUTPATIENT_CLINIC_OR_DEPARTMENT_OTHER)
Admission: EM | Admit: 2020-02-04 | Discharge: 2020-02-04 | Disposition: A | Discharge status: Home / Self Care | Payer: 59 | Attending: Emergency Medicine | Admitting: Emergency Medicine

## 2020-02-04 DIAGNOSIS — R0789 Other chest pain: Secondary | ICD-10-CM | POA: Diagnosis not present

## 2020-02-04 DIAGNOSIS — Z79899 Other long term (current) drug therapy: Secondary | ICD-10-CM | POA: Diagnosis not present

## 2020-02-04 DIAGNOSIS — I10 Essential (primary) hypertension: Secondary | ICD-10-CM | POA: Insufficient documentation

## 2020-02-04 MED ORDER — ACETAMINOPHEN 325 MG PO TABS
650.0000 mg | ORAL_TABLET | Freq: Once | ORAL | Status: AC
  Administered 2020-02-04: 650 mg via ORAL
  Filled 2020-02-04: qty 2

## 2020-02-04 NOTE — ED Provider Notes (Signed)
[facility] EMERGENCY DEPARTMENT Provider Note   CSN: [id number] Arrival date & time: 02/04/20  1008     History Chief Complaint  Patient presents with  . Chest Pain    Shelia Mcgee is a 44 y.o. female.  The history is provided by the patient.  Chest Pain Pain location:  L chest Pain quality: aching   Pain radiates to:  Does not radiate Pain severity:  Mild Onset quality:  Gradual Duration:  3 days Progression:  Unchanged Chronicity:  New Context: raising an arm   Relieved by:  Nothing Worsened by:  Movement Ineffective treatments:  None tried Associated symptoms: no abdominal pain, no back pain, no cough, no fever, no palpitations, no shortness of breath and no vomiting   Risk factors: birth control and hypertension   Risk factors: no coronary artery disease, no diabetes mellitus, no high cholesterol, not pregnant and no prior DVT/PE        Past Medical History:  Diagnosis Date  . Hypertension     Patient Active Problem List   Diagnosis Date Noted  . Arthritis 11/08/2015  . Vitamin D deficiency 05/31/2015  . Allergic rhinitis 03/10/2015  . Contraceptive management 01/22/2015  . Routine general medical examination at a health care facility 05/22/2014  . Vaginitis 04/29/2014  . HTN (hypertension) 04/29/2014    Past Surgical History:  Procedure Laterality Date  . HIP SURGERY Right 1992   States that bones were slipping. Stabilized it with 3 screws x 1 yr then had screws removed.     OB History   No obstetric history on file.     Family History  Problem Relation Age of Onset  . Hypertension Mother        died in MVA in her 73's  . Hypertension Father        had ESRD, die due to infection from his graft  . Diabetes Father   . Stroke Father   . Heart attack Maternal Uncle     Social History   Tobacco Use  . Smoking status: Never Smoker  . Smokeless tobacco: Never Used  Substance Use Topics  . Alcohol use: No  . Drug use: No     Home Medications Prior to Admission medications   Medication Sig Start Date End Date Taking? Authorizing Provider  amLODipine (NORVASC) 10 MG tablet Take 1 tablet (10 mg total) by mouth daily. 12/26/19   Sandford Craze, NP  Cholecalciferol (VITAMIN D3) 3000 UNITS TABS Take 1 tablet by mouth daily. 08/17/15   Sandford Craze, NP  metoprolol succinate (TOPROL XL) 50 MG 24 hr tablet Take 1 tablet (50 mg total) by mouth daily. Take with or immediately following a meal. 12/26/19   Sandford Craze, NP  MICROGESTIN FE 1.5/30 1.5-30 MG-MCG tablet Take 1 tablet by mouth daily. 12/26/19 03/25/20  Sandford Craze, NP  Multiple Vitamins-Minerals (MULTIVITAMIN WITH MINERALS) tablet Take 1 tablet by mouth daily.    [provider]  rizatriptan (MAXALT) 10 MG tablet rizatriptan 10 mg tablet 03/26/18   [provider]  topiramate (TOPAMAX) 100 MG tablet Take 100 mg by mouth daily.    [provider]    Allergies    Patient has no known allergies.  Review of Systems   Review of Systems  Constitutional: Negative for chills and fever.  HENT: Negative for ear pain and sore throat.   Eyes: Negative for pain and visual disturbance.  Respiratory: Negative for cough and shortness of breath.   Cardiovascular: Positive  for chest pain. Negative for palpitations.  Gastrointestinal: Negative for abdominal pain and vomiting.  Genitourinary: Negative for dysuria and hematuria.  Musculoskeletal: Negative for arthralgias and back pain.  Skin: Negative for color change and rash.  Neurological: Negative for seizures and syncope.  All other systems reviewed and are negative.   Physical Exam Updated Vital Signs BP 125/86   Pulse 66   Temp 98.2 F (36.8 C) (Oral)   Resp 20   LMP 01/14/2020   SpO2 100%   Physical Exam Vitals and nursing note reviewed.  Constitutional:      General: [name].    Appearance: She is well-developed.  HENT:     Head:  Normocephalic and atraumatic.  Eyes:     Conjunctiva/sclera: Conjunctivae normal.     Pupils: Pupils are equal, round, and reactive to light.  Cardiovascular:     Rate and Rhythm: Normal rate and regular rhythm.     Pulses:          Radial pulses are 2+ on the right side and 2+ on the left side.     Heart sounds: Normal heart sounds. No murmur.  Pulmonary:     Effort: Pulmonary effort is normal. No respiratory distress.     Breath sounds: Normal breath sounds. No decreased breath sounds, wheezing, rhonchi or rales.  Chest:     Chest wall: Tenderness (left anterior chest wall) present.  Abdominal:     Palpations: Abdomen is soft.     Tenderness: There is no abdominal tenderness.  Musculoskeletal:        General: Normal range of motion.     Cervical back: Normal range of motion and neck supple.     Right lower leg: No edema.     Left lower leg: No edema.  Skin:    General: Skin is warm and dry.     Capillary Refill: Capillary refill takes less than 2 seconds.  Neurological:     General: No focal deficit present.     Mental Status: [name].     ED Results / Procedures / Treatments   Labs (all labs ordered are listed, but only abnormal results are displayed) Labs Reviewed - No data to display  EKG EKG Interpretation  Date/Time:  Wednesday February 04 2020 10:18:50 EDT Ventricular Rate:  70 PR Interval:    QRS Duration: 84 QT Interval:  381 QTC Calculation: 412 R Axis:   57 Text Interpretation: Sinus rhythm Confirmed by Virgina Norfolk [phone]) on 02/04/2020 10:41:59 AM   Radiology DG Chest 2 View  Result Date: 02/04/2020 CLINICAL DATA:  Left chest wall pain 3 days EXAM: CHEST - 2 VIEW COMPARISON:  01/13/2014 FINDINGS: The heart size and mediastinal contours are within normal limits. Both lungs are clear. The visualized skeletal structures are unremarkable. IMPRESSION: No active cardiopulmonary disease. Electronically Signed   By: Marlan Palau M.D.   On: 02/04/2020 11:17      Procedures Procedures (including critical care time)  Medications Ordered in ED Medications  acetaminophen (TYLENOL) tablet 650 mg (650 mg Oral Given 02/04/20 1047)    ED Course  I have reviewed the triage vital signs and the nursing notes.  Pertinent labs & imaging results that were available during my care of the patient were reviewed by me and considered in my medical decision making (see chart for details).    MDM Rules/Calculators/A&P  Kerrin Markman is a 44 year old female with history of hypertension who presents to the ED with left-sided chest wall pain for the last 3 days.  Patient with unremarkable vitals.  No fever.  Pain when she claps her hands or moves her left upper arm.  Point tenderness in the left anterior chest wall on exam.  No specific trauma.  No cough or sputum production.  No radiation of the pain.  No jaw pain.  No shortness of breath.  Clear breath sounds on exam.  Good pulses throughout.  Patient with no infectious symptoms.  She does have high blood pressure but overall story appears consistent with MSK type pain.  EKG shows sinus rhythm.  No ischemic changes.  Chest x-ray showed no pneumothorax, no pneumonia, no pleural effusion.  Doubt ACS, pericarditis.  Patient does take estrogen birth control but no other PE risk factors.  Clinically does not have any symptoms to strongly suggest PE.  No hypoxia, no shortness of breath, no tachycardia.  Recommend Tylenol, Motrin.  Recommend follow-up with primary care doctor.  Recommend return if symptoms worsen.  This chart was dictated using voice recognition software.  Despite best efforts to proofread,  errors can occur which can change the documentation meaning.    Final Clinical Impression(s) / ED Diagnoses Final diagnoses:  Atypical chest pain    Rx / DC Orders ED Discharge Orders    None       Lennice Sites, DO 02/04/20 1123

## 2020-02-04 NOTE — ED Triage Notes (Addendum)
Pt here with localized left sided chest pain x 3 days. No other associated symptoms. No pain radiation. States this happened a few years ago and no abnormal findings.

## 2020-02-04 NOTE — Discharge Instructions (Addendum)
Use Tylenol and Motrin for pain.  EKG and chest x-ray were normal.  Follow-up with your primary care doctor.  Return to the ED symptoms worsen.

## 2020-02-11 ENCOUNTER — Other Ambulatory Visit: Payer: Self-pay

## 2020-02-11 ENCOUNTER — Ambulatory Visit (INDEPENDENT_AMBULATORY_CARE_PROVIDER_SITE_OTHER): Payer: 59 | Admitting: Family

## 2020-02-11 ENCOUNTER — Encounter: Payer: Self-pay | Admitting: Family

## 2020-02-11 VITALS — BP 133/96 | HR 69 | Temp 97.8°F | Resp 16 | Ht 67.0 in | Wt 313.0 lb

## 2020-02-11 DIAGNOSIS — I10 Essential (primary) hypertension: Secondary | ICD-10-CM | POA: Diagnosis not present

## 2020-02-11 DIAGNOSIS — R0789 Other chest pain: Secondary | ICD-10-CM

## 2020-02-11 LAB — TROPONIN I (HIGH SENSITIVITY): High Sens Troponin I: 9 ng/L (ref 2–17)

## 2020-02-11 NOTE — Patient Instructions (Signed)
Please complete lab work prior to leaving.  You should be contacted about scheduling your appointment with cardiology. Please return to the ER if you develop worsening chest pain or shortness of breath.

## 2020-02-11 NOTE — Progress Notes (Signed)
Subjective:    Patient ID: Shelia Mcgee, female    DOB: May 30, 1976, 44 y.o.   MRN: [id number]  HPI   Patient is a 44 year old female who presents today for follow-up.  She was evaluated in the emergency department on February 04, 2020 with chief complaint of chest pain.  ED record is reviewed.  She was noted to have point tenderness in the left anterior chest wall.  Cardiac enzymes were not performed as it was felt that her symptoms were likely musculoskeletal in origin.  She reports that she has some mild numbness down the left arm.  No current numbness. Denies neck pain. Yesterday was the first time that she experienced some chest pain which shifted from the left to the right. She describes this as a "pressure." Not all the time. Pain is intermittent.  Reports that she is having a lot of stress right now (the condo she rents is being sold, she is looking for work and recently broke up with her significant other.) Reports she had short episode of SOB right before she went to the ER, none now.  She reports that the chest pressure is made worse with exercise.   She denies current left arm numbness.   She had a normal stress test in 2018.   HTN- Pt is maintained on amlodipine 10mg  once daily.  BP Readings from Last 3 Encounters:  02/11/20 (!) 133/96  02/04/20 125/86  12/22/19 126/79       Review of Systems See HPI  Past Medical History:  Diagnosis Date  . Hypertension      Social History   Socioeconomic History  . Marital status: Single    Spouse name: Not on file  . Number of children: Not on file  . Years of education: Not on file  . Highest education level: Not on file  Occupational History  . Not on file  Tobacco Use  . Smoking status: Never Smoker  . Smokeless tobacco: Never Used  Substance and Sexual Activity  . Alcohol use: No  . Drug use: No  . Sexual activity: Never    Birth control/protection: Pill  Other Topics Concern  . Not on file  Social History Narrative     Lives alone, single   She is currently unemployed   Completed bachelors in 12/24/19   Enjoys bowling, spending time with friends, church   Social Determinants of Health   Financial Resource Strain:   . Difficulty of Paying Living Expenses:   Food Insecurity:   . Worried About [employer] in the Last Year:   . Programme researcher, broadcasting/film/video in the Last Year:   Transportation Needs:   . Barista (Medical):   Freight forwarder Lack of Transportation (Non-Medical):   Physical Activity:   . Days of Exercise per Week:   . Minutes of Exercise per Session:   Stress:   . Feeling of Stress :   Social Connections:   . Frequency of Communication with Friends and Family:   . Frequency of Social Gatherings with Friends and Family:   . Attends Religious Services:   . Active Member of Clubs or Organizations:   . Attends Marland Kitchen Meetings:   Banker Marital Status:   Intimate Partner Violence:   . Fear of Current or Ex-Partner:   . Emotionally Abused:   Marland Kitchen Physically Abused:   . Sexually Abused:     Past Surgical History:  Procedure Laterality Date  . HIP SURGERY Right  1992   States that bones were slipping. Stabilized it with 3 screws x 1 yr then had screws removed.    Family History  Problem Relation Age of Onset  . Hypertension Mother        died in MVA in her 66's  . Hypertension Father        had ESRD, die due to infection from his graft  . Diabetes Father   . Stroke Father   . Heart attack Maternal Uncle     No Known Allergies  Current Outpatient Medications on File Prior to Visit  Medication Sig Dispense Refill  . amLODipine (NORVASC) 10 MG tablet Take 1 tablet (10 mg total) by mouth daily. 90 tablet 1  . Cholecalciferol (VITAMIN D3) 3000 UNITS TABS Take 1 tablet by mouth daily. 30 tablet   . metoprolol succinate (TOPROL XL) 50 MG 24 hr tablet Take 1 tablet (50 mg total) by mouth daily. Take with or immediately following a meal. 90 tablet 1  . Multiple  Vitamins-Minerals (MULTIVITAMIN WITH MINERALS) tablet Take 1 tablet by mouth daily.    . rizatriptan (MAXALT) 10 MG tablet rizatriptan 10 mg tablet    . topiramate (TOPAMAX) 100 MG tablet Take 100 mg by mouth daily.    Marland Kitchen MICROGESTIN FE 1.5/30 1.5-30 MG-MCG tablet Take 1 tablet by mouth daily. 84 tablet 3   No current facility-administered medications on file prior to visit.    BP (!) 133/96 (BP Location: Right Arm, Patient Position: Sitting, Cuff Size: Large)   Pulse 69   Temp 97.8 F (36.6 C) (Temporal)   Resp 16   Ht 5\' 7"  (1.702 m)   Wt (!) 313 lb (142 kg)   LMP 01/14/2020   SpO2 100%   BMI 49.02 kg/m       Objective:   Physical Exam Constitutional:      Appearance: She is well-developed.  Neck:     Thyroid: No thyromegaly.  Cardiovascular:     Rate and Rhythm: Normal rate and regular rhythm.     Heart sounds: Normal heart sounds. No murmur.  Pulmonary:     Effort: Pulmonary effort is normal. No respiratory distress.     Breath sounds: Normal breath sounds. No wheezing.  Musculoskeletal:     Cervical back: Neck supple.     Comments: No reproducible anterior chest wall tenderness  Skin:    General: Skin is warm and dry.  Neurological:     Mental Status: [name] and oriented to person, place, and time.  Psychiatric:        Behavior: Behavior normal.        Thought Content: Thought content normal.        Judgment: Judgment normal.           Assessment & Plan:  Atypical chest pain- EKG is repeated today. EKG tracing is personally reviewed.  EKG notes NSR.  No acute changes. Since cardiac enzymes were not checked in the ED I will send a high sensitivity troponin.  She is advised to go to the ED if new symptoms or worsening chest pain. Will also arrange consultation with cardiology. Pt verbalizes understanding.   HTN- bp is mildly elevated today. Will plan to recheck in 1 month.  Continue current meds.   This visit occurred during the SARS-CoV-2 public  health emergency.  Safety protocols were in place, including screening questions prior to the visit, additional usage of staff PPE, and extensive cleaning of exam room while observing  appropriate contact time as indicated for disinfecting solutions.

## 2020-02-18 NOTE — Progress Notes (Signed)
CARDIOLOGY CONSULT NOTE       Patient ID: Shelia Mcgee MRN: [id number] DOB/AGE: [birth year] 44 y.o.  Admit date: (Not on file) Referring Physician: Peggyann Juba Primary Physician: Sandford Craze, NP Primary Cardiologist: New Reason for Consultation: Chest pain  Active Problems:   * No active hospital problems. *   HPI:  44 y.o. referred by DR Peggyann Juba for chest pain. History of HTN  Seen in ER 02/04/20 with point tenderness of left anterior chest wall. She has associated mild paresthesias down left arm Pain radiates to right Lots of life stress. Condo she is living in being sold, looking for work and broke up with her significant other Pressure in chest worse with exercise  She had a normal stress myovue in 2018 She did not have troponin's in ER but ECG normal and Troponin/ECG ok in office visit 02/11/20  Has not had any real pains since ER visit Still looking for work in customer service   ROS All other systems reviewed and negative except as noted above  Past Medical History:  Diagnosis Date  . Allergic rhinitis 03/10/2015  . Arthritis 11/08/2015  . HTN (hypertension) 04/29/2014  . Hypertension   . Vaginitis 04/29/2014  . Vitamin D deficiency 05/31/2015    Family History  Problem Relation Age of Onset  . Hypertension Mother        died in MVA in her 27's  . Hypertension Father        had ESRD, die due to infection from his graft  . Diabetes Father   . Stroke Father   . Heart attack Maternal Uncle     Social History   Socioeconomic History  . Marital status: Single    Spouse name: Not on file  . Number of children: Not on file  . Years of education: Not on file  . Highest education level: Not on file  Occupational History  . Not on file  Tobacco Use  . Smoking status: Never Smoker  . Smokeless tobacco: Never Used  Substance and Sexual Activity  . Alcohol use: No  . Drug use: No  . Sexual activity: Never    Birth control/protection: Pill  Other Topics Concern    . Not on file  Social History Narrative   Lives alone, single   She is currently unemployed   Completed bachelors in [employer]   Enjoys bowling, spending time with friends, church   Social Determinants of Health   Financial Resource Strain:   . Difficulty of Paying Living Expenses:   Food Insecurity:   . Worried About Programme researcher, broadcasting/film/video in the Last Year:   . Barista in the Last Year:   Transportation Needs:   . Freight forwarder (Medical):   Marland Kitchen Lack of Transportation (Non-Medical):   Physical Activity:   . Days of Exercise per Week:   . Minutes of Exercise per Session:   Stress:   . Feeling of Stress :   Social Connections:   . Frequency of Communication with Friends and Family:   . Frequency of Social Gatherings with Friends and Family:   . Attends Religious Services:   . Active Member of Clubs or Organizations:   . Attends Banker Meetings:   Marland Kitchen Marital Status:   Intimate Partner Violence:   . Fear of Current or Ex-Partner:   . Emotionally Abused:   Marland Kitchen Physically Abused:   . Sexually Abused:     Past Surgical History:  Procedure Laterality Date  .  HIP SURGERY Right 1992   States that bones were slipping. Stabilized it with 3 screws x 1 yr then had screws removed.      Current Outpatient Medications:  .  amLODipine (NORVASC) 10 MG tablet, Take 1 tablet (10 mg total) by mouth daily., Disp: 90 tablet, Rfl: 1 .  Cholecalciferol (VITAMIN D3) 3000 UNITS TABS, Take 1 tablet by mouth daily., Disp: 30 tablet, Rfl:  .  metoprolol succinate (TOPROL XL) 50 MG 24 hr tablet, Take 1 tablet (50 mg total) by mouth daily. Take with or immediately following a meal., Disp: 90 tablet, Rfl: 1 .  Multiple Vitamins-Minerals (MULTIVITAMIN WITH MINERALS) tablet, Take 1 tablet by mouth daily., Disp: , Rfl:  .  rizatriptan (MAXALT) 10 MG tablet, rizatriptan 10 mg tablet, Disp: , Rfl:  .  topiramate (TOPAMAX) 100 MG tablet, Take 100 mg by mouth daily., Disp: ,  Rfl:  .  MICROGESTIN FE 1.5/30 1.5-30 MG-MCG tablet, Take 1 tablet by mouth daily., Disp: 84 tablet, Rfl: 3    Physical Exam: Height 5\' 7"  (1.702 m).   Affect appropriate Overweight black female  HEENT: normal Neck supple with no adenopathy JVP normal no bruits no thyromegaly Lungs clear with no wheezing and good diaphragmatic motion Heart:  S1/S2 no murmur, no rub, gallop or click PMI normal Abdomen: benighn, BS positve, no tenderness, no AAA no bruit.  No HSM or HJR Distal pulses intact with no bruits No edema Neuro non-focal Skin warm and dry No muscular weakness   Labs:   Lab Results  Component Value Date   WBC 6.3 12/22/2019   HGB 12.7 12/22/2019   HCT 38.7 12/22/2019   MCV 83.7 12/22/2019   PLT 351.0 12/22/2019   No results for input(s): NA, K, CL, CO2, BUN, CREATININE, CALCIUM, PROT, BILITOT, ALKPHOS, ALT, AST, GLUCOSE in the last 168 hours.  Invalid input(s): LABALBU Lab Results  Component Value Date   TROPONINI <0.30 01/13/2014    Lab Results  Component Value Date   CHOL 169 12/22/2019   CHOL 166 09/03/2018   CHOL 164 06/01/2017   Lab Results  Component Value Date   HDL 48.60 12/22/2019   HDL 57.10 09/03/2018   HDL 52.20 06/01/2017   Lab Results  Component Value Date   LDLCALC 104 (H) 12/22/2019   LDLCALC 94 09/03/2018   LDLCALC 96 06/01/2017   Lab Results  Component Value Date   TRIG 83.0 12/22/2019   TRIG 72.0 09/03/2018   TRIG 79.0 06/01/2017   Lab Results  Component Value Date   CHOLHDL 3 12/22/2019   CHOLHDL 3 09/03/2018   CHOLHDL 3 06/01/2017   No results found for: LDLDIRECT    Radiology: DG Chest 2 View  Result Date: 02/04/2020 CLINICAL DATA:  Left chest wall pain 3 days EXAM: CHEST - 2 VIEW COMPARISON:  01/13/2014 FINDINGS: The heart size and mediastinal contours are within normal limits. Both lungs are clear. The visualized skeletal structures are unremarkable. IMPRESSION: No active cardiopulmonary disease. Electronically  Signed   By: 03/15/2014 M.D.   On: 02/04/2020 11:17    EKG: NSR rate 64 normal 02/11/20    ASSESSMENT AND PLAN:   1. Chest Pain:  Atypical previously normal myovue 2016 and 2018 although significant breast attenuation will order ETT for starters Not ideal for cardiac CT due to body habitus. Discussed calcium score but not covered by insurance and has some financial stress now  2. HTN: Well controlled.  Continue current medications and low sodium Dash  type diet.   3. Anxiety:  Playing a role in symptoms f/u primary Life stressors significant  4. Migraines:  PRN Topamax and Maxalt   F/U PRN if ETT normal   Signed: Jenkins Rouge 02/23/2020, 2:39 PM

## 2020-02-23 ENCOUNTER — Encounter: Payer: Self-pay | Admitting: Cardiovascular Disease

## 2020-02-23 ENCOUNTER — Other Ambulatory Visit: Payer: Self-pay

## 2020-02-23 ENCOUNTER — Ambulatory Visit (INDEPENDENT_AMBULATORY_CARE_PROVIDER_SITE_OTHER): Payer: 59 | Admitting: Cardiovascular Disease

## 2020-02-23 VITALS — BP 132/90 | HR 71 | Ht 67.0 in | Wt 320.0 lb

## 2020-02-23 DIAGNOSIS — R079 Chest pain, unspecified: Secondary | ICD-10-CM | POA: Diagnosis not present

## 2020-02-23 NOTE — Patient Instructions (Addendum)
Medication Instructions:  *If you need a refill on your cardiac medications before your next appointment, please call your pharmacy*  Lab Work: If you have labs (blood work) drawn today and your tests are completely normal, you will receive your results only by: Marland Kitchen MyChart Message (if you have MyChart) OR . A paper copy in the mail If you have any lab test that is abnormal or we need to change your treatment, we will call you to review the results.  Testing/Procedures: Your physician has requested that you have an exercise tolerance test. For further information please visit [email or web address] Please also follow instruction sheet, as given.    Follow-Up: At [facility], you and your health needs are our priority.  As part of our continuing mission to provide you with exceptional heart care, we have created designated Provider Care Teams.  These Care Teams include your primary Cardiologist (physician) and Advanced Practice Providers (APPs -  Physician Assistants and Nurse Practitioners) who all work together to provide you with the care you need, when you need it.  We recommend signing up for the patient portal called "MyChart".  Sign up information is provided on this After Visit Summary.  MyChart is used to connect with patients for Virtual Visits (Telemedicine).  Patients are able to view lab/test results, encounter notes, upcoming appointments, etc.  Non-urgent messages can be sent to your provider as well.   To learn more about what you can do with MyChart, go to [email or web address].    Your next appointment:   As needed  The format for your next appointment:   In Person  Provider:   You may see Dr. Eden Emms or one of the following Advanced Practice Providers on your designated Care Team:    Norma Fredrickson, NP  Nada Boozer, NP  Georgie Chard, NP

## 2020-03-23 ENCOUNTER — Ambulatory Visit (INDEPENDENT_AMBULATORY_CARE_PROVIDER_SITE_OTHER): Payer: 59

## 2020-03-23 ENCOUNTER — Other Ambulatory Visit: Payer: Self-pay

## 2020-03-23 DIAGNOSIS — R079 Chest pain, unspecified: Secondary | ICD-10-CM | POA: Diagnosis not present

## 2020-03-23 LAB — EXERCISE TOLERANCE TEST
Estimated workload: 7 METS
Exercise duration (min): 5 min
Exercise duration (sec): 0 s
MPHR: 179 {beats}/min
Peak HR: 164 {beats}/min
Percent HR: 91 %
RPE: 15
Rest HR: 76 {beats}/min

## 2020-03-24 ENCOUNTER — Telehealth: Payer: Self-pay | Admitting: Cardiovascular Disease

## 2020-03-24 NOTE — Telephone Encounter (Signed)
Provided GXT results.  Pt verbalizes understanding and appreciation for call.

## 2020-03-24 NOTE — Telephone Encounter (Signed)
Patient returning call for stress test results. 

## 2020-04-07 ENCOUNTER — Encounter: Payer: Self-pay | Admitting: Family

## 2020-04-08 NOTE — Telephone Encounter (Signed)
Forms placed in PCP folder for completion.

## 2020-05-14 ENCOUNTER — Other Ambulatory Visit: Payer: Self-pay

## 2020-06-23 ENCOUNTER — Encounter: Payer: Self-pay | Admitting: Family

## 2020-06-23 ENCOUNTER — Other Ambulatory Visit: Payer: Self-pay

## 2020-06-23 ENCOUNTER — Ambulatory Visit (INDEPENDENT_AMBULATORY_CARE_PROVIDER_SITE_OTHER): Payer: 59 | Admitting: Family

## 2020-06-23 VITALS — BP 138/87 | HR 59 | Temp 98.3°F | Resp 16 | Ht 67.0 in | Wt 317.0 lb

## 2020-06-23 DIAGNOSIS — I10 Essential (primary) hypertension: Secondary | ICD-10-CM

## 2020-06-23 DIAGNOSIS — M25511 Pain in right shoulder: Secondary | ICD-10-CM

## 2020-06-23 DIAGNOSIS — G43909 Migraine, unspecified, not intractable, without status migrainosus: Secondary | ICD-10-CM

## 2020-06-23 DIAGNOSIS — Z23 Encounter for immunization: Secondary | ICD-10-CM

## 2020-06-23 DIAGNOSIS — E559 Vitamin D deficiency, unspecified: Secondary | ICD-10-CM

## 2020-06-23 DIAGNOSIS — M25512 Pain in left shoulder: Secondary | ICD-10-CM

## 2020-06-23 NOTE — Addendum Note (Signed)
Addended by: Mervin Kung A on: 06/23/2020 09:51 AM   Modules accepted: Orders

## 2020-06-23 NOTE — Progress Notes (Signed)
Subjective:    Patient ID: Shelia Mcgee, female    DOB: May 07, 1976, 43 y.o.   MRN: [id number]  HPI  Patient is a 44 yr old female who presents today for follow up.  HTN- maintained on amlodipine 10mg , toprol xl 50mg  once daily. Denies LE edema.   BP Readings from Last 3 Encounters:  06/23/20 138/87  02/23/20 132/90  02/11/20 (!) 133/96   Vit d deficiency- continues vit D3 3000 iu  tab once daily.   Migraines- maintained on topamax and prn maxalt. She is followed at [facility] for neurology.  Reports migraines have been stable.   Bilateral shoulder pain- wakes up in the AM with aching.  Heat will help her pain.  Shoulders feel stiff. Requesting referral to orthopedics.   Review of Systems See HPI  Past Medical History:  Diagnosis Date  . Allergic rhinitis 03/10/2015  . Arthritis 11/08/2015  . HTN (hypertension) 04/29/2014  . Hypertension   . Vaginitis 04/29/2014  . Vitamin D deficiency 05/31/2015     Social History   Socioeconomic History  . Marital status: Single    Spouse name: Not on file  . Number of children: Not on file  . Years of education: Not on file  . Highest education level: Not on file  Occupational History  . Not on file  Tobacco Use  . Smoking status: Never Smoker  . Smokeless tobacco: Never Used  Substance and Sexual Activity  . Alcohol use: No  . Drug use: No  . Sexual activity: Never    Birth control/protection: Pill  Other Topics Concern  . Not on file  Social History Narrative   Lives alone, single   She is currently unemployed   Completed bachelors in 05/01/2014   Enjoys bowling, spending time with friends, church   Social Determinants of Health   Financial Resource Strain:   . Difficulty of Paying Living Expenses: Not on file  Food Insecurity:   . Worried About 06/02/2015 in the Last Year: Not on file  . Ran Out of Food in the Last Year: Not on file  Transportation Needs:   . Lack of Transportation (Medical): Not on file   . Lack of Transportation (Non-Medical): Not on file  Physical Activity:   . Days of Exercise per Week: Not on file  . Minutes of Exercise per Session: Not on file  Stress:   . Feeling of Stress : Not on file  Social Connections:   . Frequency of Communication with Friends and Family: Not on file  . Frequency of Social Gatherings with Friends and Family: Not on file  . Attends Religious Services: Not on file  . Active Member of Clubs or Organizations: Not on file  . Attends [employer] Meetings: Not on file  . Marital Status: Not on file  Intimate Partner Violence:   . Fear of Current or Ex-Partner: Not on file  . Emotionally Abused: Not on file  . Physically Abused: Not on file  . Sexually Abused: Not on file    Past Surgical History:  Procedure Laterality Date  . HIP SURGERY Right 1992   States that bones were slipping. Stabilized it with 3 screws x 1 yr then had screws removed.    Family History  Problem Relation Age of Onset  . Hypertension Mother        died in MVA in her 91's  . Hypertension Father        had ESRD, die  due to infection from his graft  . Diabetes Father   . Stroke Father   . Heart attack Maternal Uncle     No Known Allergies  Current Outpatient Medications on File Prior to Visit  Medication Sig Dispense Refill  . amLODipine (NORVASC) 10 MG tablet Take 1 tablet (10 mg total) by mouth daily. 90 tablet 1  . Cholecalciferol (VITAMIN D3) 3000 UNITS TABS Take 1 tablet by mouth daily. 30 tablet   . metoprolol succinate (TOPROL XL) 50 MG 24 hr tablet Take 1 tablet (50 mg total) by mouth daily. Take with or immediately following a meal. 90 tablet 1  . Multiple Vitamins-Minerals (MULTIVITAMIN WITH MINERALS) tablet Take 1 tablet by mouth daily.    . rizatriptan (MAXALT) 10 MG tablet rizatriptan 10 mg tablet    . topiramate (TOPAMAX) 100 MG tablet Take 100 mg by mouth daily.     No current facility-administered medications on file prior to visit.     BP 138/87 (BP Location: Right Arm, Patient Position: Sitting, Cuff Size: Large)   Pulse (!) 59   Temp 98.3 F (36.8 C) (Oral)   Resp 16   Ht 5\' 7"  (1.702 m)   Wt (!) 317 lb (143.8 kg)   SpO2 100%   BMI 49.65 kg/m       Objective:   Physical Exam Constitutional:      Appearance: She is well-developed.  Cardiovascular:     Rate and Rhythm: Normal rate and regular rhythm.     Heart sounds: Normal heart sounds. No murmur heard.   Pulmonary:     Effort: Pulmonary effort is normal. No respiratory distress.     Breath sounds: Normal breath sounds. No wheezing.  Musculoskeletal:     Comments: Slight decreased ROM of bilateral shoulders  Skin:    General: Skin is warm and dry.  Psychiatric:        Behavior: Behavior normal.        Thought Content: Thought content normal.        Judgment: Judgment normal.           Assessment & Plan:  Bilateral shoulder pain- new. Will refer to orthopedics.  HTN- bp stable.  Continue amlodipine 10mg , toprol xl 50mg  once daily.  Migraines- stable. Followed by neurology.  Vit D deficiency- stable. Continue vit D 3000 iu once daily.  Flu shot today.

## 2020-06-23 NOTE — Patient Instructions (Signed)
Please complete lab work prior to leaving.   

## 2020-07-02 NOTE — Addendum Note (Signed)
Addended by: Rosita Kea on: 07/02/2020 03:32 PM   Modules accepted: Orders

## 2020-07-05 ENCOUNTER — Other Ambulatory Visit (INDEPENDENT_AMBULATORY_CARE_PROVIDER_SITE_OTHER): Payer: 59

## 2020-07-05 ENCOUNTER — Other Ambulatory Visit: Payer: Self-pay

## 2020-07-05 DIAGNOSIS — I10 Essential (primary) hypertension: Secondary | ICD-10-CM | POA: Diagnosis not present

## 2020-07-06 ENCOUNTER — Encounter: Payer: Self-pay | Admitting: Family

## 2020-07-06 LAB — BASIC METABOLIC PANEL
BUN/Creatinine Ratio: 17 (calc) (ref 6–22)
BUN: 21 mg/dL (ref 7–25)
CO2: 22 mmol/L (ref 20–32)
Calcium: 9.6 mg/dL (ref 8.6–10.2)
Chloride: 106 mmol/L (ref 98–110)
Creat: 1.27 mg/dL — ABNORMAL HIGH (ref 0.50–1.10)
Glucose, Bld: 72 mg/dL (ref 65–99)
Potassium: 4.2 mmol/L (ref 3.5–5.3)
Sodium: 138 mmol/L (ref 135–146)

## 2020-08-02 ENCOUNTER — Other Ambulatory Visit: Payer: Self-pay

## 2020-08-02 ENCOUNTER — Encounter: Payer: Self-pay | Admitting: Family

## 2020-08-02 MED ORDER — METOPROLOL SUCCINATE ER 50 MG PO TB24: 50.0000 mg | ORAL_TABLET | Freq: Every day | ORAL | 1 refills | Status: DC

## 2020-08-02 MED ORDER — AMLODIPINE BESYLATE 10 MG PO TABS: 10.0000 mg | ORAL_TABLET | Freq: Every day | ORAL | 1 refills | Status: DC

## 2020-12-22 ENCOUNTER — Ambulatory Visit (INDEPENDENT_AMBULATORY_CARE_PROVIDER_SITE_OTHER): Payer: BC Managed Care – PPO | Admitting: Family

## 2020-12-22 ENCOUNTER — Other Ambulatory Visit (HOSPITAL_COMMUNITY)
Admission: RE | Admit: 2020-12-22 | Discharge: 2020-12-22 | Disposition: A | Discharge status: Home / Self Care | Payer: BC Managed Care – PPO | Source: Ambulatory Visit | Attending: Family | Admitting: Family

## 2020-12-22 ENCOUNTER — Other Ambulatory Visit: Payer: Self-pay

## 2020-12-22 ENCOUNTER — Encounter: Payer: Self-pay | Admitting: Family

## 2020-12-22 VITALS — BP 117/76 | HR 67 | Temp 98.2°F | Resp 16 | Ht 67.0 in | Wt 304.0 lb

## 2020-12-22 DIAGNOSIS — Z01419 Encounter for gynecological examination (general) (routine) without abnormal findings: Secondary | ICD-10-CM | POA: Insufficient documentation

## 2020-12-22 DIAGNOSIS — Z0000 Encounter for general adult medical examination without abnormal findings: Secondary | ICD-10-CM | POA: Diagnosis not present

## 2020-12-22 DIAGNOSIS — Z1159 Encounter for screening for other viral diseases: Secondary | ICD-10-CM | POA: Diagnosis not present

## 2020-12-22 DIAGNOSIS — N289 Disorder of kidney and ureter, unspecified: Secondary | ICD-10-CM | POA: Diagnosis not present

## 2020-12-22 LAB — COMPREHENSIVE METABOLIC PANEL
ALT: 20 U/L (ref 0–35)
AST: 26 U/L (ref 0–37)
Albumin: 3.9 g/dL (ref 3.5–5.2)
Alkaline Phosphatase: 47 U/L (ref 39–117)
BUN: 16 mg/dL (ref 6–23)
CO2: 27 mEq/L (ref 19–32)
Calcium: 9.1 mg/dL (ref 8.4–10.5)
Chloride: 103 mEq/L (ref 96–112)
Creatinine, Ser: 1.03 mg/dL (ref 0.40–1.20)
GFR: 67.02 mL/min (ref >60.00)
Glucose, Bld: 75 mg/dL (ref 70–99)
Potassium: 4 mEq/L (ref 3.5–5.1)
Sodium: 137 mEq/L (ref 135–145)
Total Bilirubin: 0.4 mg/dL (ref 0.2–1.2)
Total Protein: 7 g/dL (ref 6.0–8.3)

## 2020-12-22 NOTE — Patient Instructions (Addendum)
Please schedule eye exam and dental exam. Continue your work on healthy diet, exercise and weight loss.

## 2020-12-22 NOTE — Progress Notes (Signed)
Subjective:   By signing my name below, I, [name], attest that this documentation has been prepared under the direction and in the presence of Sandford Craze. 12/22/2020    Patient ID: Shelia Mcgee, female    DOB: Oct 13, 1975, 45 y.o.   MRN: [id number]  No chief complaint on file.   HPI Patient is in today for a comprehensive physical exam. She reports her throat has been itchy past couple of days.She also reports she occasionally has a small amount of blood in her stools due to passing hard stools. She attributes this to her diet. She denies cough, and cold symptoms, diarrhea, dysuria, frequency, hematuria , joint pain, headaches, migraines, depression, and anxiety at this time.  Hypertension- She is managing her hypertension by taking 10 mg amlodipine daily PO.  BP Readings from Last 3 Encounters:  12/22/20 117/76  06/23/20 138/87  02/23/20 132/90    Immunizations: She is UTD on her Covid 19 vaccinations. She has completed her tetanus vacine on 12/14/2020. Diet: She reports she has lost 5 pounds on her diet but has not found consistent weight loss.  Exercise: She participates in light exercise daily by following along videos of exercises and walking during work. Colonoscopy: Not yet completed. Dexa: Not yet completed Pap Smear: Last completed 06/03/2018. Results normal. Repeat in 3 years. Mammogram: Last completed 12/29/2019. Results normal. Repeat in 1 year. Dental: Not yet completed. Vision: Last completed 1 year ago. She said she is willing to make a new appointment.  Past Medical History:  Diagnosis Date  . Allergic rhinitis 03/10/2015  . Arthritis 11/08/2015  . HTN (hypertension) 04/29/2014  . Hypertension   . Vaginitis 04/29/2014  . Vitamin D deficiency 05/31/2015    Past Surgical History:  Procedure Laterality Date  . HIP SURGERY Right 1992   States that bones were slipping. Stabilized it with 3 screws x 1 yr then had screws removed.    Family History   Problem Relation Age of Onset  . Hypertension Mother        died in MVA in her 4's  . Hypertension Father        had ESRD, die due to infection from his graft  . Diabetes Father   . Stroke Father   . Heart attack Maternal Uncle     Social History   Socioeconomic History  . Marital status: Single    Spouse name: Not on file  . Number of children: Not on file  . Years of education: Not on file  . Highest education level: Not on file  Occupational History  . Not on file  Tobacco Use  . Smoking status: Never Smoker  . Smokeless tobacco: Never Used  Substance and Sexual Activity  . Alcohol use: No  . Drug use: No  . Sexual activity: Never    Birth control/protection: Pill  Other Topics Concern  . Not on file  Social History Narrative   Lives alone, single   She is currently unemployed   Completed bachelors in [employer]   Enjoys bowling, spending time with friends, church   Social Determinants of Health   Financial Resource Strain: Not on file  Food Insecurity: Not on file  Transportation Needs: Not on file  Physical Activity: Not on file  Stress: Not on file  Social Connections: Not on file  Intimate Partner Violence: Not on file    Outpatient Medications Prior to Visit  Medication Sig Dispense Refill  . amLODipine (NORVASC) 10 MG tablet Take  1 tablet (10 mg total) by mouth daily. 90 tablet 1  . Cholecalciferol (VITAMIN D3) 3000 UNITS TABS Take 1 tablet by mouth daily. 30 tablet   . metoprolol succinate (TOPROL XL) 50 MG 24 hr tablet Take 1 tablet (50 mg total) by mouth daily. Take with or immediately following a meal. 90 tablet 1  . Multiple Vitamins-Minerals (MULTIVITAMIN WITH MINERALS) tablet Take 1 tablet by mouth daily.    . rizatriptan (MAXALT) 10 MG tablet rizatriptan 10 mg tablet    . topiramate (TOPAMAX) 100 MG tablet Take 100 mg by mouth daily.     No facility-administered medications prior to visit.    No Known Allergies  ROS      Objective:    Physical Exam Exam conducted with a chaperone present.  Constitutional:      Appearance: She is well-developed.  HENT:     Right Ear: Tympanic membrane and external ear normal.     Left Ear: Tympanic membrane and external ear normal.     Ears:     Comments: Small amount of cerumen noted in right ear canal Eyes:     Extraocular Movements: Extraocular movements intact.     Pupils: Pupils are equal, round, and reactive to light.     Comments: No nystagmus.  Neck:     Thyroid: No thyromegaly.  Cardiovascular:     Rate and Rhythm: Normal rate and regular rhythm.     Heart sounds: Normal heart sounds. No murmur heard.   Pulmonary:     Effort: Pulmonary effort is normal. No respiratory distress.     Breath sounds: Normal breath sounds. No wheezing.  Chest:  Breasts:     Right: Absent.     Left: Absent.    Genitourinary:    Labia:        Right: No rash.        Left: No rash.      Vagina: Normal.     Cervix: Normal.     Uterus: Normal.      Adnexa: Right adnexa normal and left adnexa normal.  Musculoskeletal:     Cervical back: Neck supple.     Comments: 5/5 strength in upper extremities.  Skin:    General: Skin is warm and dry.  Neurological:     Mental Status: [name] and oriented to person, place, and time.  Psychiatric:        Behavior: Behavior normal.        Thought Content: Thought content normal.        Judgment: Judgment normal.     There were no vitals taken for this visit. Wt Readings from Last 3 Encounters:  06/23/20 (!) 317 lb (143.8 kg)  02/23/20 (!) 320 lb (145.2 kg)  02/11/20 (!) 313 lb (142 kg)    Diabetic Foot Exam - Simple   No data filed    Lab Results  Component Value Date   WBC 6.3 12/22/2019   HGB 12.7 12/22/2019   HCT 38.7 12/22/2019   PLT 351.0 12/22/2019   GLUCOSE 72 07/05/2020   CHOL 169 12/22/2019   TRIG 83.0 12/22/2019   HDL 48.60 12/22/2019   LDLCALC 104 (H) 12/22/2019   ALT 10 12/22/2019   AST 14  12/22/2019   NA 138 07/05/2020   K 4.2 07/05/2020   CL 106 07/05/2020   CREATININE 1.27 (H) 07/05/2020   BUN 21 07/05/2020   CO2 22 07/05/2020   TSH 1.03 12/22/2019   INR 0.97 01/13/2014  Lab Results  Component Value Date   TSH 1.03 12/22/2019   Lab Results  Component Value Date   WBC 6.3 12/22/2019   HGB 12.7 12/22/2019   HCT 38.7 12/22/2019   MCV 83.7 12/22/2019   PLT 351.0 12/22/2019   Lab Results  Component Value Date   NA 138 07/05/2020   K 4.2 07/05/2020   CO2 22 07/05/2020   GLUCOSE 72 07/05/2020   BUN 21 07/05/2020   CREATININE 1.27 (H) 07/05/2020   BILITOT 0.6 12/22/2019   ALKPHOS 39 12/22/2019   AST 14 12/22/2019   ALT 10 12/22/2019   PROT 6.7 12/22/2019   ALBUMIN 3.9 12/22/2019   CALCIUM 9.6 07/05/2020   GFR 70.45 12/22/2019   Lab Results  Component Value Date   CHOL 169 12/22/2019   Lab Results  Component Value Date   HDL 48.60 12/22/2019   Lab Results  Component Value Date   LDLCALC 104 (H) 12/22/2019   Lab Results  Component Value Date   TRIG 83.0 12/22/2019   Lab Results  Component Value Date   CHOLHDL 3 12/22/2019   No results found for: HGBA1C     Assessment & Plan:   Problem List Items Addressed This Visit   None      No orders of the defined types were placed in this encounter.   I, Shehryar Tyson Alias, personally preformed the services described in this documentation.  All medical record entries made by the scribe were at my direction and in my presence.  I have reviewed the chart and discharge instructions (if applicable) and agree that the record reflects my personal performance and is accurate and complete. 12/22/2020   I,[name],acting as a Neurosurgeon for Lemont Fillers, NP.,have documented all relevant documentation on the behalf of Lemont Fillers, NP,as directed by  Lemont Fillers, NP while in the presence of Lemont Fillers, NP.    Shehryar [employer]

## 2020-12-22 NOTE — Assessment & Plan Note (Signed)
Discussed healthy diet, exercise, weight loss. Recommended that she schedule routine dental and eye exams.  Pap performed today.  Immunizations reviewed and up to date.

## 2020-12-23 LAB — HEPATITIS C ANTIBODY
Hepatitis C Ab: NONREACTIVE
SIGNAL TO CUT-OFF: 0 (ref <1.00)

## 2020-12-23 LAB — CYTOLOGY - PAP
Comment: NEGATIVE
Diagnosis: NEGATIVE
High risk HPV: NEGATIVE

## 2021-01-14 ENCOUNTER — Encounter: Payer: Self-pay | Admitting: Family

## 2021-01-14 ENCOUNTER — Other Ambulatory Visit: Payer: Self-pay

## 2021-01-14 MED ORDER — METOPROLOL SUCCINATE ER 50 MG PO TB24: 50.0000 mg | ORAL_TABLET | Freq: Every day | ORAL | 1 refills | Status: DC

## 2021-01-14 MED ORDER — AMLODIPINE BESYLATE 10 MG PO TABS: 10.0000 mg | ORAL_TABLET | Freq: Every day | ORAL | 1 refills | Status: DC

## 2021-02-07 ENCOUNTER — Ambulatory Visit (HOSPITAL_BASED_OUTPATIENT_CLINIC_OR_DEPARTMENT_OTHER)
Admission: RE | Admit: 2021-02-07 | Discharge: 2021-02-07 | Disposition: A | Discharge status: Home / Self Care | Payer: BC Managed Care – PPO | Source: Ambulatory Visit | Attending: Family | Admitting: Family

## 2021-02-07 ENCOUNTER — Other Ambulatory Visit: Payer: Self-pay

## 2021-02-07 ENCOUNTER — Encounter (HOSPITAL_BASED_OUTPATIENT_CLINIC_OR_DEPARTMENT_OTHER): Payer: Self-pay

## 2021-02-07 DIAGNOSIS — Z0000 Encounter for general adult medical examination without abnormal findings: Secondary | ICD-10-CM

## 2021-02-07 DIAGNOSIS — Z1231 Encounter for screening mammogram for malignant neoplasm of breast: Secondary | ICD-10-CM | POA: Diagnosis not present

## 2021-03-22 ENCOUNTER — Encounter (HOSPITAL_BASED_OUTPATIENT_CLINIC_OR_DEPARTMENT_OTHER): Payer: Self-pay | Admitting: *Deleted

## 2021-03-22 ENCOUNTER — Other Ambulatory Visit: Payer: Self-pay

## 2021-03-22 ENCOUNTER — Emergency Department (HOSPITAL_BASED_OUTPATIENT_CLINIC_OR_DEPARTMENT_OTHER)
Admission: EM | Admit: 2021-03-22 | Discharge: 2021-03-22 | Disposition: A | Discharge status: Home / Self Care | Payer: BC Managed Care – PPO | Attending: Emergency Medicine | Admitting: Emergency Medicine

## 2021-03-22 ENCOUNTER — Emergency Department (HOSPITAL_BASED_OUTPATIENT_CLINIC_OR_DEPARTMENT_OTHER): Payer: BC Managed Care – PPO

## 2021-03-22 DIAGNOSIS — I10 Essential (primary) hypertension: Secondary | ICD-10-CM | POA: Insufficient documentation

## 2021-03-22 DIAGNOSIS — R079 Chest pain, unspecified: Secondary | ICD-10-CM | POA: Diagnosis not present

## 2021-03-22 DIAGNOSIS — R0789 Other chest pain: Secondary | ICD-10-CM | POA: Diagnosis not present

## 2021-03-22 DIAGNOSIS — Z79899 Other long term (current) drug therapy: Secondary | ICD-10-CM | POA: Insufficient documentation

## 2021-03-22 DIAGNOSIS — R072 Precordial pain: Secondary | ICD-10-CM | POA: Diagnosis not present

## 2021-03-22 DIAGNOSIS — R0602 Shortness of breath: Secondary | ICD-10-CM | POA: Diagnosis not present

## 2021-03-22 LAB — TROPONIN I (HIGH SENSITIVITY)
Troponin I (High Sensitivity): 3 ng/L (ref <18)
Troponin I (High Sensitivity): 4 ng/L (ref <18)

## 2021-03-22 LAB — BASIC METABOLIC PANEL
Anion gap: 8 (ref 5–15)
BUN: 16 mg/dL (ref 6–20)
CO2: 25 mmol/L (ref 22–32)
Calcium: 9 mg/dL (ref 8.9–10.3)
Chloride: 105 mmol/L (ref 98–111)
Creatinine, Ser: 1.08 mg/dL — ABNORMAL HIGH (ref 0.44–1.00)
GFR, Estimated: >60 mL/min (ref >60)
Glucose, Bld: 88 mg/dL (ref 70–99)
Potassium: 3.9 mmol/L (ref 3.5–5.1)
Sodium: 138 mmol/L (ref 135–145)

## 2021-03-22 LAB — CBC
HCT: 39.6 % (ref 36.0–46.0)
Hemoglobin: 13.2 g/dL (ref 12.0–15.0)
MCH: 27.2 pg (ref 26.0–34.0)
MCHC: 33.3 g/dL (ref 30.0–36.0)
MCV: 81.6 fL (ref 80.0–100.0)
Platelets: 334 10*3/uL (ref 150–400)
RBC: 4.85 MIL/uL (ref 3.87–5.11)
RDW: 15.3 % (ref 11.5–15.5)
WBC: 6.5 10*3/uL (ref 4.0–10.5)
nRBC: 0 % (ref 0.0–0.2)

## 2021-03-22 LAB — PREGNANCY, URINE: Preg Test, Ur: NEGATIVE

## 2021-03-22 NOTE — ED Triage Notes (Signed)
C/o mid sternal chest pain x 2 days , SOB x 1 day

## 2021-03-22 NOTE — ED Provider Notes (Signed)
[facility] EMERGENCY DEPARTMENT Provider Note   CSN: [id number] Arrival date & time: 03/22/21  1756     History Chief Complaint  Patient presents with   Chest Pain    Shelia Mcgee is a 45 y.o. female.   Chest Pain Pain location:  L chest and R chest Pain quality: aching   Pain radiates to:  Does not radiate Pain severity:  Mild Onset quality:  Gradual Duration:  2 days Timing:  Intermittent Progression:  Waxing and waning Chronicity:  New Context: at rest   Worsened by:  Nothing Ineffective treatments:  None tried Associated symptoms: no abdominal pain, no altered mental status, no anorexia, no anxiety, no back pain, no claudication, no cough, no diaphoresis, no dizziness, no dysphagia, no fatigue, no fever, no headache, no heartburn, no lower extremity edema, no nausea, no near-syncope, no numbness, no palpitations, no shortness of breath and no vomiting   Risk factors: hypertension   Risk factors: no birth control, no coronary artery disease, no diabetes mellitus, no high cholesterol, no prior DVT/PE and no smoking       Past Medical History:  Diagnosis Date   Allergic rhinitis 03/10/2015   Arthritis 11/08/2015   HTN (hypertension) 04/29/2014   Hypertension    Vaginitis 04/29/2014   Vitamin D deficiency 05/31/2015    Patient Active Problem List   Diagnosis Date Noted   Arthritis 11/08/2015   Vitamin D deficiency 05/31/2015   Allergic rhinitis 03/10/2015   Contraceptive management 01/22/2015   Routine general medical examination at a health care facility 05/22/2014   Vaginitis 04/29/2014   HTN (hypertension) 04/29/2014    Past Surgical History:  Procedure Laterality Date   HIP SURGERY Right 1992   States that bones were slipping. Stabilized it with 3 screws x 1 yr then had screws removed.     OB History   No obstetric history on file.     Family History  Problem Relation Age of Onset   Hypertension Mother        died in MVA in her 57's    Hypertension Father        had ESRD, die due to infection from his graft   Diabetes Father    Stroke Father    Heart attack Maternal Uncle     Social History   Tobacco Use   Smoking status: Never   Smokeless tobacco: Never  Substance Use Topics   Alcohol use: No   Drug use: No    Home Medications Prior to Admission medications   Medication Sig Start Date End Date Taking? Authorizing Provider  amLODipine (NORVASC) 10 MG tablet Take 1 tablet (10 mg total) by mouth daily. 01/14/21   Sandford Craze, NP  Cholecalciferol (VITAMIN D3) 3000 UNITS TABS Take 1 tablet by mouth daily. 08/17/15   Sandford Craze, NP  metoprolol succinate (TOPROL XL) 50 MG 24 hr tablet Take 1 tablet (50 mg total) by mouth daily. Take with or immediately following a meal. 01/14/21   Sandford Craze, NP  Multiple Vitamins-Minerals (MULTIVITAMIN WITH MINERALS) tablet Take 1 tablet by mouth daily.    [provider]  rizatriptan (MAXALT) 10 MG tablet rizatriptan 10 mg tablet 03/26/18   [provider]  topiramate (TOPAMAX) 100 MG tablet Take 100 mg by mouth daily.    [provider]    Allergies    Patient has no known allergies.  Review of Systems   Review of Systems  Constitutional:  Negative for chills, diaphoresis, fatigue  and fever.  HENT:  Negative for ear pain, sore throat and trouble swallowing.   Eyes:  Negative for pain and visual disturbance.  Respiratory:  Negative for cough and shortness of breath.   Cardiovascular:  Positive for chest pain. Negative for palpitations, claudication and near-syncope.  Gastrointestinal:  Negative for abdominal pain, anorexia, heartburn, nausea and vomiting.  Genitourinary:  Negative for dysuria and hematuria.  Musculoskeletal:  Negative for arthralgias and back pain.  Skin:  Negative for color change and rash.  Neurological:  Negative for dizziness, seizures, syncope, numbness and headaches.  All other systems reviewed and are  negative.  Physical Exam Updated Vital Signs  ED Triage Vitals  Enc Vitals Group     BP 03/22/21 1806 (!) 130/94     Pulse Rate 03/22/21 1806 (!) 58     Resp 03/22/21 1806 18     Temp 03/22/21 1806 98.4 F (36.9 C)     Temp Source 03/22/21 1806 Oral     SpO2 03/22/21 1805 100 %     Weight 03/22/21 1802 293 lb (132.9 kg)     Height 03/22/21 1802 5\' 7"  (1.702 m)     Head Circumference --      Peak Flow --      Pain Score 03/22/21 1802 8     Pain Loc --      Pain Edu? --      Excl. in GC? --      Physical Exam Vitals and nursing note reviewed.  Constitutional:      General: Shelia Mcgee is not in acute distress.    Appearance: Shelia Mcgee is well-developed. Shelia Mcgee is not ill-appearing.  HENT:     Head: Normocephalic and atraumatic.     Mouth/Throat:     Mouth: Mucous membranes are moist.  Eyes:     Extraocular Movements: Extraocular movements intact.     Conjunctiva/sclera: Conjunctivae normal.     Pupils: Pupils are equal, round, and reactive to light.  Cardiovascular:     Rate and Rhythm: Normal rate and regular rhythm.     Pulses: Normal pulses.          Radial pulses are 2+ on the right side and 2+ on the left side.     Heart sounds: Normal heart sounds. No murmur heard. Pulmonary:     Effort: Pulmonary effort is normal. No respiratory distress.     Breath sounds: Normal breath sounds. No decreased breath sounds, wheezing or rhonchi.  Abdominal:     Palpations: Abdomen is soft.     Tenderness: There is no abdominal tenderness.  Musculoskeletal:     Cervical back: Normal range of motion and neck supple.     Right lower leg: No edema.     Left lower leg: No edema.  Skin:    General: Skin is warm and dry.     Capillary Refill: Capillary refill takes less than 2 seconds.  Neurological:     General: No focal deficit present.     Mental Status: Shelia Mcgee is alert.  Psychiatric:        Mood and Affect: Mood normal.    ED Results / Procedures / Treatments   Labs (all labs ordered are  listed, but only abnormal results are displayed) Labs Reviewed  BASIC METABOLIC PANEL - Abnormal; Notable for the following components:      Result Value   Creatinine, Ser 1.08 (*)    All other components within normal limits  CBC  PREGNANCY, URINE  TROPONIN I (HIGH SENSITIVITY)  TROPONIN I (HIGH SENSITIVITY)    EKG EKG Interpretation  Date/Time:  Tuesday March 22 2021 18:08:50 EDT Ventricular Rate:  57 PR Interval:  172 QRS Duration: 74 QT Interval:  420 QTC Calculation: 408 R Axis:   89 Text Interpretation: Sinus bradycardia Low voltage QRS Borderline ECG Confirmed by Virgina Norfolk ([id number]) on 03/22/2021 6:25:10 PM  Radiology DG Chest 2 View  Result Date: 03/22/2021 CLINICAL DATA:  Midsternal chest pain for 2 days, short of breath EXAM: CHEST - 2 VIEW COMPARISON:  02/04/2020 FINDINGS: Frontal and lateral views of the chest demonstrate an unremarkable cardiac silhouette. No acute airspace disease, effusion, or pneumothorax. No acute bony abnormalities. IMPRESSION: 1. No acute intrathoracic process. Electronically Signed   By: Sharlet Salina M.D.   On: 03/22/2021 18:58    Procedures Procedures   Medications Ordered in ED Medications - No data to display  ED Course  I have reviewed the triage vital signs and the nursing notes.  Pertinent labs & imaging results that were available during my care of the patient were reviewed by me and considered in my medical decision making (see chart for details).    MDM Rules/Calculators/A&P                           Kassaundra Hair is here with chest pain.  History of hypertension.  Normal vitals.  No fever.  EKG shows sinus rhythm.  No ischemic changes.  Noncardiac sounding chest pain.  Heart score of 1.  PERC negative doubt PE.  Initial troponin within normal limits.  No significant anemia, electrolyte ab Mody, kidney injury.  Chest x-ray with no evidence of pneumonia.  No infectious symptoms.  Will check second troponin and have her  follow-up outpatient with her primary care doctor.  Suspect may be musculoskeletal source or GI source.  Not having any abdominal pain.  Repeat troponin is unremarkable.  We will have her follow-up with PCP.  Discharged in good condition.  This chart was dictated using voice recognition software.  Despite best efforts to proofread,  errors can occur which can change the documentation meaning.   Final Clinical Impression(s) / ED Diagnoses Final diagnoses:  Nonspecific chest pain    Rx / DC Orders ED Discharge Orders     None        Virgina Norfolk, DO 03/22/21 2149

## 2021-03-25 DIAGNOSIS — Z20822 Contact with and (suspected) exposure to covid-19: Secondary | ICD-10-CM | POA: Diagnosis not present

## 2021-06-20 ENCOUNTER — Ambulatory Visit: Payer: BC Managed Care – PPO | Admitting: Family

## 2021-06-20 ENCOUNTER — Other Ambulatory Visit: Payer: Self-pay

## 2021-06-20 VITALS — BP 113/78 | HR 66 | Temp 98.2°F | Resp 16 | Ht 67.0 in | Wt 285.0 lb

## 2021-06-20 DIAGNOSIS — I10 Essential (primary) hypertension: Secondary | ICD-10-CM

## 2021-06-20 NOTE — Assessment & Plan Note (Addendum)
BP Readings from Last 3 Encounters:  06/20/21 113/78  03/22/21 122/84  12/22/20 117/76   Stable on current regimen. Continue amlodipine 10mg  and toprol xl 50mg .   Wt Readings from Last 3 Encounters:  06/20/21 285 lb (129.3 kg)  03/22/21 293 lb (132.9 kg)  12/22/20 (!) 304 lb (137.9 kg)

## 2021-06-20 NOTE — Patient Instructions (Signed)
Please keep up the great work with diet/exercise/weight loss.

## 2021-06-20 NOTE — Assessment & Plan Note (Signed)
Commended pt on her weight loss. Continue diet/exercise and weight loss efforts.

## 2021-06-20 NOTE — Progress Notes (Addendum)
Subjective:   By signing my name below, I, Shelia Mcgee, attest that this documentation has been prepared under the direction and in the presence of Shelia Mcgee. 06/20/2021    Patient ID: Shelia Mcgee, female    DOB: March 30, 1976, 45 y.o.   MRN: [id number]  Chief Complaint  Patient presents with   Hypertension    Here for follow up    Hypertension  Patient is in today for a office visit.   Blood pressure- Her blood pressure is doing well during this visit. She has a blood pressure measuring device at home and occasionally measures it. She continues taking 10 mg amlodipine daily PO, 50 mg metoprolol succinate daily PO and reports no new issues while taking it.   BP Readings from Last 3 Encounters:  06/20/21 113/78  03/22/21 122/84  12/22/20 117/76   Pulse Readings from Last 3 Encounters:  06/20/21 66  03/22/21 66  12/22/20 67   Weight- She has lost weight since her last visit. She reports being more conscious of her diet and is limiting her intake of junk food and increasing her vegetable and fruit intake. She is planning on exercising.   Wt Readings from Last 3 Encounters:  06/20/21 285 lb (129.3 kg)  03/22/21 293 lb (132.9 kg)  12/22/20 (!) 304 lb (137.9 kg)   Chest pain- She reports going to the ER for chest pain on 03/22/2021 and found no new issues while there. She has no episodes of chest pain since then.  Immunizations- She is UTD on flu vaccines at this time.    Health Maintenance Due  Topic Date Due   COVID-19 Vaccine (4 - Booster for Pfizer series) 11/19/2020     Past Medical History:  Diagnosis Date   Allergic rhinitis 03/10/2015   Arthritis 11/08/2015   HTN (hypertension) 04/29/2014   Hypertension    Vaginitis 04/29/2014   Vitamin D deficiency 05/31/2015    Past Surgical History:  Procedure Laterality Date   HIP SURGERY Right 1992   States that bones were slipping. Stabilized it with 3 screws x 1 yr then had screws removed.    Family  History  Problem Relation Age of Onset   Hypertension Mother        died in MVA in her 80's   Hypertension Father        had ESRD, die due to infection from his graft   Diabetes Father    Stroke Father    Heart attack Maternal Uncle     Social History   Socioeconomic History   Marital status: Single    Spouse name: Not on file   Number of children: Not on file   Years of education: Not on file   Highest education level: Not on file  Occupational History   Not on file  Tobacco Use   Smoking status: Never   Smokeless tobacco: Never  Substance and Sexual Activity   Alcohol use: No   Drug use: No   Sexual activity: Never    Birth control/protection: None  Other Topics Concern   Not on file  Social History Narrative   Lives alone, single   She is currently unemployed   Completed bachelors in Investment banker, corporate   Enjoys bowling, spending time with friends, church   Social Determinants of Health   Financial Resource Strain: Not on file  Food Insecurity: Not on file  Transportation Needs: Not on file  Physical Activity: Not on file  Stress: Not on file  Social Connections: Not on file  Intimate Partner Violence: Not on file    Outpatient Medications Prior to Visit  Medication Sig Dispense Refill   amLODipine (NORVASC) 10 MG tablet Take 1 tablet (10 mg total) by mouth daily. 90 tablet 1   Cholecalciferol (VITAMIN D3) 3000 UNITS TABS Take 1 tablet by mouth daily. 30 tablet    metoprolol succinate (TOPROL XL) 50 MG 24 hr tablet Take 1 tablet (50 mg total) by mouth daily. Take with or immediately following a meal. 90 tablet 1   Multiple Vitamins-Minerals (MULTIVITAMIN WITH MINERALS) tablet Take 1 tablet by mouth daily.     rizatriptan (MAXALT) 10 MG tablet rizatriptan 10 mg tablet     topiramate (TOPAMAX) 100 MG tablet Take 100 mg by mouth daily.     No facility-administered medications prior to visit.    No Known Allergies  ROS    See HPI Objective:    Physical  Exam Constitutional:      General: [name].    Appearance: Normal appearance. She is not ill-appearing.  HENT:     Head: Normocephalic and atraumatic.     Right Ear: External ear normal.     Left Ear: External ear normal.  Eyes:     Extraocular Movements: Extraocular movements intact.     Pupils: Pupils are equal, round, and reactive to light.  Cardiovascular:     Rate and Rhythm: Normal rate and regular rhythm.     Heart sounds: Normal heart sounds. No murmur heard.   No gallop.  Pulmonary:     Effort: Pulmonary effort is normal. No respiratory distress.     Breath sounds: Normal breath sounds. No wheezing or rales.  Skin:    General: Skin is warm and dry.  Neurological:     Mental Status: [name] and oriented to person, place, and time.  Psychiatric:        Behavior: Behavior normal.    BP 113/78 (BP Location: Right Arm, Patient Position: Sitting, Cuff Size: Large)   Pulse 66   Temp 98.2 F (36.8 C) (Oral)   Resp 16   Ht 5\' 7"  (1.702 m)   Wt 285 lb (129.3 kg)   SpO2 100%   BMI 44.64 kg/m  Wt Readings from Last 3 Encounters:  06/20/21 285 lb (129.3 kg)  03/22/21 293 lb (132.9 kg)  12/22/20 (!) 304 lb (137.9 kg)       Assessment & Plan:   Problem List Items Addressed This Visit       Unprioritized   Morbid obesity (HCC)    Commended pt on her weight loss. Continue diet/exercise and weight loss efforts.       HTN (hypertension) - Primary    BP Readings from Last 3 Encounters:  06/20/21 113/78  03/22/21 122/84  12/22/20 117/76  Stable on current regimen. Continue amlodipine 10mg  and toprol xl 50mg .   Wt Readings from Last 3 Encounters:  06/20/21 285 lb (129.3 kg)  03/22/21 293 lb (132.9 kg)  12/22/20 (!) 304 lb (137.9 kg)          No orders of the defined types were placed in this encounter.   I, 06/22/21 Mcgee, personally preformed the services described in this documentation.  All medical record entries made by  the scribe were at my direction and in my presence.  I have reviewed the chart and discharge instructions (if applicable) and agree that the record reflects my personal performance and  is accurate and complete. 06/20/2021   I,Shelia Mcgee,acting as a Neurosurgeon for Lemont Fillers, Mcgee.,have documented all relevant documentation on the behalf of Lemont Fillers, Mcgee,as directed by  Lemont Fillers, Mcgee while in the presence of Lemont Fillers, Mcgee.   Lemont Fillers, Mcgee

## 2021-08-01 ENCOUNTER — Other Ambulatory Visit: Payer: Self-pay | Admitting: Family

## 2021-08-24 DIAGNOSIS — J300 Vasomotor rhinitis: Secondary | ICD-10-CM | POA: Diagnosis not present

## 2021-10-16 IMAGING — CR DG CHEST 2V
2 series · 2 of 2 positions shown · non-contrast
Comparison: 02/04/2020

CLINICAL DATA: Midsternal chest pain for 2 days, short of breath

EXAM:
CHEST - 2 VIEW

[w chest pa *]
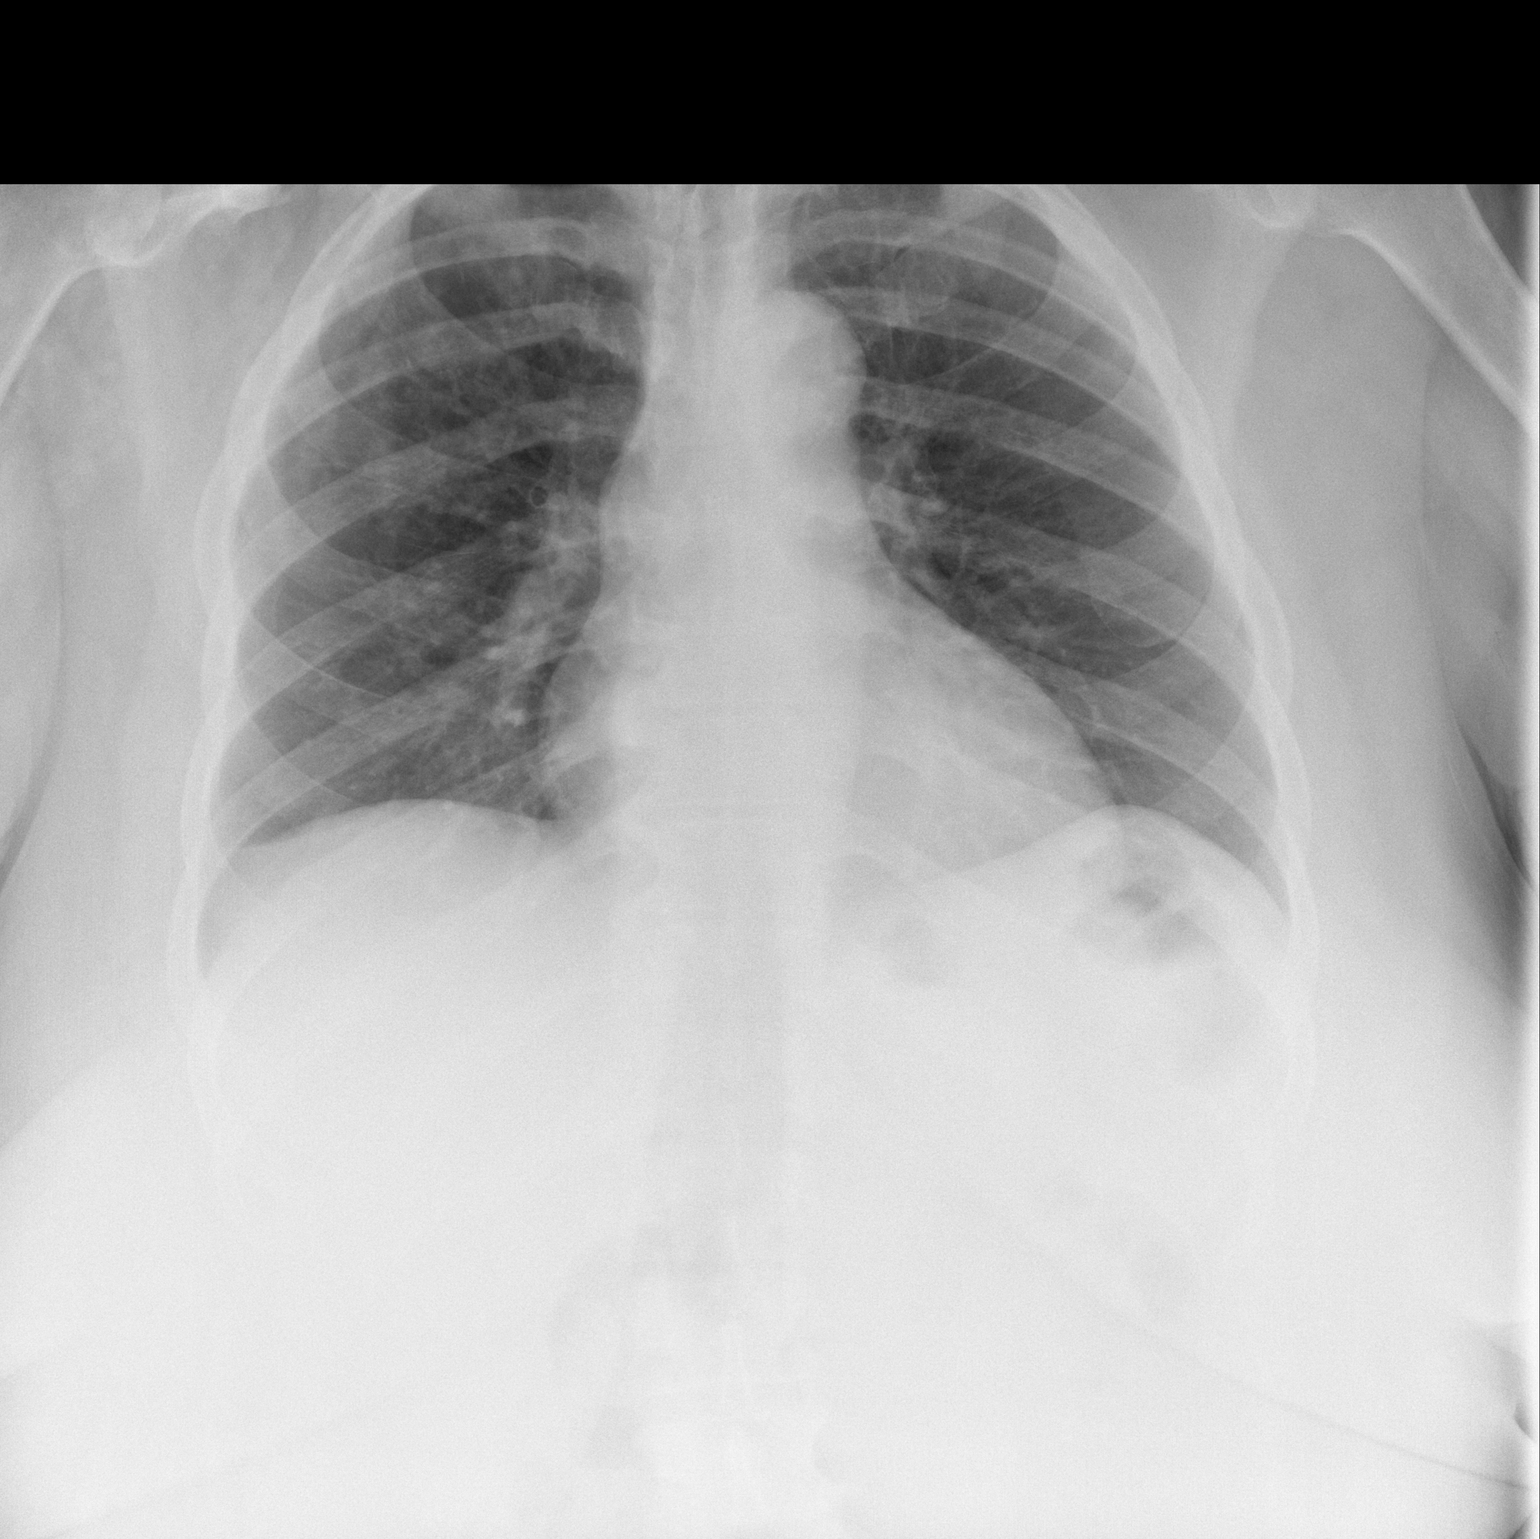

[w chest lat *]
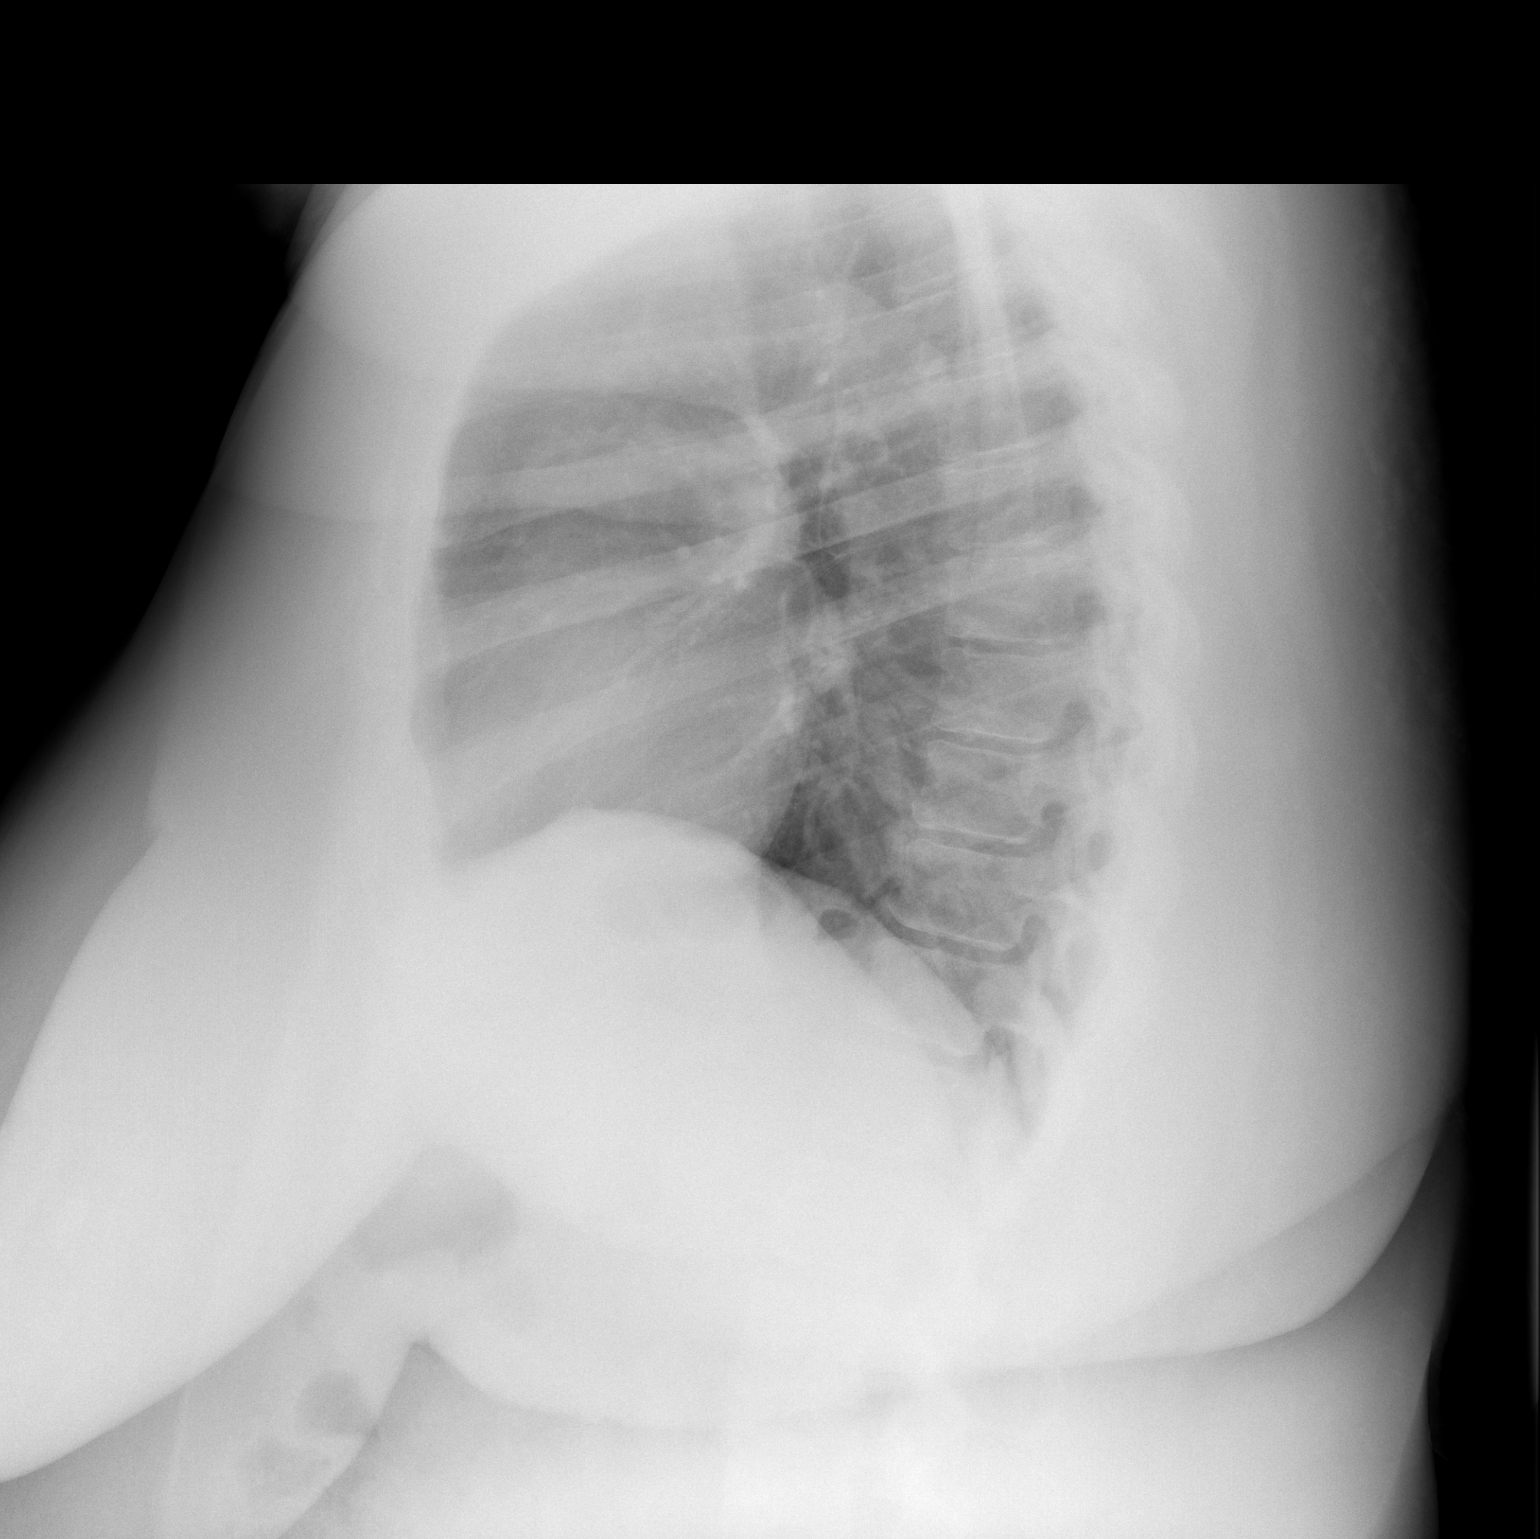

[2 of 2 positions shown; findings below may reference images not displayed]

FINDINGS: Frontal and lateral views of the chest demonstrate an unremarkable
cardiac silhouette. No acute airspace disease, effusion, or
pneumothorax. No acute bony abnormalities.
IMPRESSION: 1. No acute intrathoracic process.

## 2021-10-17 DIAGNOSIS — G43009 Migraine without aura, not intractable, without status migrainosus: Secondary | ICD-10-CM | POA: Diagnosis not present

## 2021-12-26 ENCOUNTER — Encounter: Payer: BC Managed Care – PPO | Admitting: Family

## 2022-01-10 ENCOUNTER — Ambulatory Visit (INDEPENDENT_AMBULATORY_CARE_PROVIDER_SITE_OTHER): Payer: BC Managed Care – PPO | Admitting: Family

## 2022-01-10 ENCOUNTER — Encounter: Payer: Self-pay | Admitting: Family

## 2022-01-10 VITALS — BP 113/75 | HR 68 | Temp 98.4°F | Resp 16 | Ht 67.0 in | Wt 291.0 lb

## 2022-01-10 DIAGNOSIS — M1990 Unspecified osteoarthritis, unspecified site: Secondary | ICD-10-CM

## 2022-01-10 DIAGNOSIS — Z0000 Encounter for general adult medical examination without abnormal findings: Secondary | ICD-10-CM

## 2022-01-10 DIAGNOSIS — Z1231 Encounter for screening mammogram for malignant neoplasm of breast: Secondary | ICD-10-CM

## 2022-01-10 DIAGNOSIS — R7989 Other specified abnormal findings of blood chemistry: Secondary | ICD-10-CM | POA: Diagnosis not present

## 2022-01-10 DIAGNOSIS — I10 Essential (primary) hypertension: Secondary | ICD-10-CM

## 2022-01-10 LAB — COMPREHENSIVE METABOLIC PANEL
ALT: 11 U/L (ref 0–35)
AST: 16 U/L (ref 0–37)
Albumin: 4.2 g/dL (ref 3.5–5.2)
Alkaline Phosphatase: 45 U/L (ref 39–117)
BUN: 10 mg/dL (ref 6–23)
CO2: 25 mEq/L (ref 19–32)
Calcium: 8.9 mg/dL (ref 8.4–10.5)
Chloride: 103 mEq/L (ref 96–112)
Creatinine, Ser: 0.93 mg/dL (ref 0.40–1.20)
GFR: 75.2 mL/min (ref >60.00)
Glucose, Bld: 76 mg/dL (ref 70–99)
Potassium: 4 mEq/L (ref 3.5–5.1)
Sodium: 137 mEq/L (ref 135–145)
Total Bilirubin: 0.7 mg/dL (ref 0.2–1.2)
Total Protein: 7.1 g/dL (ref 6.0–8.3)

## 2022-01-10 MED ORDER — AMLODIPINE BESYLATE 10 MG PO TABS: 10.0000 mg | ORAL_TABLET | Freq: Every day | ORAL | 1 refills | Status: DC

## 2022-01-10 MED ORDER — METOPROLOL SUCCINATE ER 50 MG PO TB24: ORAL_TABLET | ORAL | 1 refills | Status: DC

## 2022-01-10 NOTE — Assessment & Plan Note (Addendum)
Wt Readings from Last 3 Encounters:  ?01/10/22 291 lb (132 kg)  ?06/20/21 285 lb (129.3 kg)  ?03/22/21 293 lb (132.9 kg)  ? ?Discussed healthy diet/exercise. She will schedule dental exam. Recommended that she get a bivalent covid booster at her pharmacy.  ?

## 2022-01-10 NOTE — Assessment & Plan Note (Signed)
Advised her to be very sparing in her use of meloxicam and use tylenol for every day use.  ?

## 2022-01-10 NOTE — Progress Notes (Signed)
? ?Subjective:  ? ?By signing my name below, I, Carylon Perches, attest that this documentation has been prepared under the direction and in the presence of [place], NP 01/10/2022   ? ? Patient ID: Tanajah Boulter, female    DOB: 30-Nov-1975, 46 y.o.   MRN: [id number] ? ?Chief Complaint  ?Patient presents with  ? Annual Exam  ? ? ?HPI ?Patient is in today for a comprehensive physical exam. ? ?Joint Pain - She states that she has joint pain in her knee, shoulders and hip.  ? ?Meloxicam - She is inquiring about whether it's okay to use meloxicam.  ? ?She denies having any fever, new muscle pain, new moles, congestion, sinus pain, sore throat, chest pain, palpations, cough, SOB ,wheezing,n/v/d, constipation, blood in stool, dysuria, frequency, hematuria, depression, anxiety, headaches at this time ? ?Social history: She denies any changes to her family history. She reports no recent surgeries.  ?Pap Smear: Last completed on 12/22/2020 ?Mammogram: Last completed on 02/07/2021 ?Immunizations: She has received 3 Covid 19 vaccines. She is UTD on tetanus and influenza vaccine.  ?Diet: She has not been eating a healthy diet. She states that her occupation and coworkers trigger her. ?Exercise: She has not been regularly exercising.  ?Dental: She is UTD on dental exams.  ?Vision: She is UTD on vision exams.  ? ?Health Maintenance Due  ?Topic Date Due  ? COVID-19 Vaccine (4 - Booster for Pfizer series) 11/19/2020  ? ? ?Past Medical History:  ?Diagnosis Date  ? Allergic rhinitis 03/10/2015  ? Arthritis 11/08/2015  ? HTN (hypertension) 04/29/2014  ? Hypertension   ? Vaginitis 04/29/2014  ? Vitamin D deficiency 05/31/2015  ? ? ?Past Surgical History:  ?Procedure Laterality Date  ? HIP SURGERY Right 1992  ? States that bones were slipping. Stabilized it with 3 screws x 1 yr then had screws removed.  ? ? ?Family History  ?Problem Relation Age of Onset  ? Hypertension Mother   ?     died in [place] in her 7's  ? Hypertension Father   ?      had ESRD, die due to infection from his graft  ? Diabetes Father   ? Stroke Father   ? Heart attack Maternal Uncle   ? ? ?Social History  ? ?Socioeconomic History  ? Marital status: Single  ?  Spouse name: Not on file  ? Number of children: Not on file  ? Years of education: Not on file  ? Highest education level: Not on file  ?Occupational History  ? Not on file  ?Tobacco Use  ? Smoking status: Never  ? Smokeless tobacco: Never  ?Substance and Sexual Activity  ? Alcohol use: No  ? Drug use: No  ? Sexual activity: Not Currently  ?  Birth control/protection: None  ?Other Topics Concern  ? Not on file  ?Social History Narrative  ? Lives alone, single  ? 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 Completed bachelors in [employer]  ? Enjoys bowling, spending time with friends, church  ? ?Social Determinants of Health  ? ?Financial Resource Strain: Not on file  ?Food Insecurity: Not on file  ?Transportation Needs: Not on file  ?Physical Activity: Not on file  ?Stress: Not on file  ?Social Connections: Not on file  ?Intimate Partner Violence: Not on file  ? ? ?Outpatient Medications Prior to Visit  ?Medication Sig Dispense Refill  ? Cholecalciferol (VITAMIN D3) 3000 UNITS TABS Take 1 tablet  by mouth daily. 30 tablet   ? meloxicam (MOBIC) 7.5 MG tablet Take by mouth.    ? Multiple Vitamins-Minerals (MULTIVITAMIN WITH MINERALS) tablet Take 1 tablet by mouth daily.    ? prochlorperazine (COMPAZINE) 10 MG/2ML injection     ? rizatriptan (MAXALT) 10 MG tablet rizatriptan 10 mg tablet    ? topiramate (TOPAMAX) 100 MG tablet Take 100 mg by mouth daily.    ? amLODipine (NORVASC) 10 MG tablet TAKE ONE TABLET BY MOUTH ONCE DAILY 90 tablet 1  ? metoprolol succinate (TOPROL-XL) 50 MG 24 hr tablet TAKE ONE TABLET BY MOUTH ONCE DAILY. TAKE WITH OR IMMEDIATELY FOLLOWING A MEAL 90 tablet 1  ? ?No facility-administered medications prior to visit.  ? ? ?No Known Allergies ? ?Review of Systems  ?Constitutional:   Negative for fever.  ?HENT:  Negative for congestion, ear pain, sinus pain and sore throat.   ?Respiratory:  Negative for wheezing.   ?Cardiovascular:  Negative for palpitations.  ?Gastrointestinal:  Negative for blood in stool, constipation, diarrhea, nausea and vomiting.  ?Genitourinary:  Negative for dysuria, frequency and hematuria.  ?Musculoskeletal:  Positive for joint pain (Bilateral Shoulders, Hip, Knee). Negative for myalgias.  ?Skin:   ?     (-) [place]  ?Neurological:  Negative for headaches.  ?Psychiatric/Behavioral:  Negative for depression. The patient is not nervous/anxious.   ? ?   ?Objective:  ?  ?Physical Exam ?Constitutional:   ?   General: [name]. ?   Appearance: Normal appearance. She is not ill-appearing.  ?HENT:  ?   Head: Normocephalic and atraumatic.  ?   Right Ear: Tympanic membrane, ear canal and external ear normal.  ?   Left Ear: Tympanic membrane, ear canal and external ear normal.  ?   Mouth/Throat:  ?   Pharynx: No oropharyngeal exudate.  ?Eyes:  ?   Extraocular Movements: Extraocular movements intact.  ?   Pupils: Pupils are equal, round, and reactive to light.  ?Cardiovascular:  ?   Rate and Rhythm: Normal rate and regular rhythm.  ?   Heart sounds: Normal heart sounds. No murmur heard. ?  No gallop.  ?Pulmonary:  ?   Effort: Pulmonary effort is normal. No respiratory distress.  ?   Breath sounds: Normal breath sounds. No wheezing or rales.  ?Chest:  ?Breasts: ?   Breasts are symmetrical.  ?   Right: No inverted nipple or mass.  ?   Left: No inverted nipple or mass.  ?Abdominal:  ?   General: Bowel sounds are normal. There is no distension.  ?   Palpations: Abdomen is soft.  ?   Tenderness: There is no abdominal tenderness. There is no guarding.  ?Musculoskeletal:  ?   Comments: 5/5 strength upper and lower extremeties  ?Skin: ?   General: Skin is warm and dry.  ?Neurological:  ?   Mental Status: [name] and oriented to person, place, and time.  ?   Deep  Tendon Reflexes:  ?   Reflex Scores: ?     Patellar reflexes are 2+ on the right side and 2+ on the left side. ?Psychiatric:     ?   Mood and Affect: Mood normal.     ?   Behavior: Behavior normal.     ?   Judgment: Judgment normal.  ? ? ?BP 113/75 (BP Location: Right Arm, Patient Position: Sitting, Cuff Size: Large)   Pulse 68   Temp 98.4 ?F (36.9 ?  C) (Oral)   Resp 16   Ht '5\' 7"'  (1.702 m)   Wt 291 lb (132 kg)   SpO2 100%   BMI 45.58 kg/m?  ?Wt Readings from Last 3 Encounters:  ?01/10/22 291 lb (132 kg)  ?06/20/21 285 lb (129.3 kg)  ?03/22/21 293 lb (132.9 kg)  ? ? ?   ?Assessment & Plan:  ? ?Problem List Items Addressed This Visit   ? ?  ? Unprioritized  ? Routine general medical examination at a health care facility  ?  Wt Readings from Last 3 Encounters:  ?01/10/22 291 lb (132 kg)  ?06/20/21 285 lb (129.3 kg)  ?03/22/21 293 lb (132.9 kg)  ?Discussed healthy diet/exercise. She will schedule dental exam. Recommended that she get a bivalent covid booster at her pharmacy.  ?  ?  ? HTN (hypertension)  ?  BP stable on amlodipine and metoprolol.  ? ?  ?  ? Relevant Medications  ? amLODipine (NORVASC) 10 MG tablet  ? metoprolol succinate (TOPROL-XL) 50 MG 24 hr tablet  ? Arthritis  ?  Advised her to be very sparing in her use of meloxicam and use tylenol for every day use.  ? ?  ?  ? Relevant Medications  ? meloxicam (MOBIC) 7.5 MG tablet  ? ?Other Visit Diagnoses   ? ? Encounter for screening mammogram for malignant neoplasm of breast    -  Primary  ? Relevant Orders  ? MM 3D SCREEN BREAST BILATERAL  ? Elevated serum creatinine      ? Relevant Orders  ? Comp Met (CMET)  ? ?  ? ? ? ? ?Meds ordered this encounter  ?Medications  ? amLODipine (NORVASC) 10 MG tablet  ?  Sig: Take 1 tablet (10 mg total) by mouth daily.  ?  Dispense:  90 tablet  ?  Refill:  1  ?  Order Specific Question:   Supervising Provider  ?  Answer:   Penni Homans A [[id number]]  ? metoprolol succinate (TOPROL-XL) 50 MG 24 hr tablet  ?  Sig: TAKE ONE  TABLET BY MOUTH ONCE DAILY. TAKE WITH OR IMMEDIATELY FOLLOWING A MEAL  ?  Dispense:  90 tablet  ?  Refill:  1  ?  Order Specific Question:   Supervising Provider  ?  Answer:   Penni Homans A [[id number]]  ? ? ?I, Melis

## 2022-01-10 NOTE — Assessment & Plan Note (Signed)
BP stable on amlodipine and metoprolol.  ?

## 2022-01-10 NOTE — Patient Instructions (Addendum)
Please schedule routine dental exam. ?Try to add 30 minutes of exercise 5 days a week.  ?Complete lab work prior to leaving. ?Schedule mammogram on the first floor.  ?

## 2022-02-13 ENCOUNTER — Ambulatory Visit (HOSPITAL_BASED_OUTPATIENT_CLINIC_OR_DEPARTMENT_OTHER)
Admission: RE | Admit: 2022-02-13 | Discharge: 2022-02-13 | Disposition: A | Discharge status: Home / Self Care | Payer: BC Managed Care – PPO | Source: Ambulatory Visit | Attending: Family | Admitting: Family

## 2022-02-13 ENCOUNTER — Encounter (HOSPITAL_BASED_OUTPATIENT_CLINIC_OR_DEPARTMENT_OTHER): Payer: Self-pay

## 2022-02-13 DIAGNOSIS — Z1231 Encounter for screening mammogram for malignant neoplasm of breast: Secondary | ICD-10-CM | POA: Insufficient documentation

## 2022-03-09 ENCOUNTER — Telehealth: Payer: Self-pay | Admitting: Family

## 2022-03-09 DIAGNOSIS — M79671 Pain in right foot: Secondary | ICD-10-CM

## 2022-03-09 NOTE — Telephone Encounter (Signed)
Call but no answer, lvm for patient to call and provide reason for request. Is she having any Gout symptoms?  The [facility] confirmed they will run labs at no cost to patient if we send order.

## 2022-03-09 NOTE — Addendum Note (Signed)
Addended by: Thelma Barge D on: 03/09/2022 03:51 PM   Modules accepted: Orders

## 2022-03-09 NOTE — Telephone Encounter (Signed)
Left detailed message on machine for patient to call back if she would like an appointment, but order was faxed anyway.

## 2022-03-09 NOTE — Telephone Encounter (Signed)
Patient reports right food joint swelling and pain and will like to get uric acid test. This [facility] does not charge for labs.  Please advise if ok to order and fax.

## 2022-03-09 NOTE — Telephone Encounter (Signed)
Pt called stating she would like an order for blood work to be done to check for gout to the following practice:  [facility] [address] [place], [place], [place] [address] P: [phone] F: [phone]

## 2022-07-14 ENCOUNTER — Ambulatory Visit: Payer: BC Managed Care – PPO | Admitting: Family

## 2022-07-14 ENCOUNTER — Telehealth: Payer: Self-pay | Admitting: Family

## 2022-07-16 NOTE — Telephone Encounter (Signed)
Please schedule follow up

## 2022-07-19 NOTE — Telephone Encounter (Signed)
Lvm2 sched  

## 2022-07-22 ENCOUNTER — Ambulatory Visit
Admission: EM | Admit: 2022-07-22 | Discharge: 2022-07-22 | Disposition: A | Discharge status: Home / Self Care | Payer: BC Managed Care – PPO | Attending: Emergency Medicine | Admitting: Emergency Medicine

## 2022-07-22 ENCOUNTER — Ambulatory Visit (INDEPENDENT_AMBULATORY_CARE_PROVIDER_SITE_OTHER): Payer: BC Managed Care – PPO

## 2022-07-22 DIAGNOSIS — J309 Allergic rhinitis, unspecified: Secondary | ICD-10-CM | POA: Diagnosis not present

## 2022-07-22 DIAGNOSIS — K5900 Constipation, unspecified: Secondary | ICD-10-CM | POA: Diagnosis not present

## 2022-07-22 DIAGNOSIS — R1031 Right lower quadrant pain: Secondary | ICD-10-CM

## 2022-07-22 DIAGNOSIS — R1911 Absent bowel sounds: Secondary | ICD-10-CM | POA: Diagnosis not present

## 2022-07-22 MED ORDER — FLUTICASONE PROPIONATE 50 MCG/ACT NA SUSP: 1.0000 | Freq: Every day | NASAL | 2 refills | Status: AC

## 2022-07-22 MED ORDER — IBUPROFEN 400 MG PO TABS: 400.0000 mg | ORAL_TABLET | Freq: Three times a day (TID) | ORAL | 0 refills | Status: DC | PRN

## 2022-07-22 MED ORDER — METHYLPREDNISOLONE 8 MG PO TABS: 8.0000 mg | ORAL_TABLET | Freq: Every day | ORAL | 0 refills | Status: AC

## 2022-07-22 MED ORDER — CETIRIZINE HCL 10 MG PO TABS: 10.0000 mg | ORAL_TABLET | Freq: Every day | ORAL | 1 refills | Status: DC

## 2022-07-22 NOTE — ED Triage Notes (Signed)
Pt reports having headache, sinus pressure, and lower abd pain.  Started: 3 days ago   Home interventions:  tylenol, gas-x

## 2022-07-22 NOTE — ED Provider Notes (Addendum)
[facility]    CSN: [id number] Arrival date & time: 07/22/22  0850    HISTORY   Chief Complaint  Patient presents with   Abdominal Pain   Headache   HPI Shelia Mcgee is a pleasant, 46 y.o. female who presents to urgent care today. Return precautions advised. Patient complains of sinus pressure and headache for the past several days.  Patient reports a history of allergies, states she takes cetirizine every day but still having a lot of clear rhinorrhea and nasal congestion.  Patient also complains of bilateral lower abdominal pain that began about 3 days ago.  Patient states has been taking Tylenol and Gas-X, states Gas-X did provide a little bit of relief of bloating sensation.  Patient describes the pain is persistent, occurs in various locations of her right and left sided lower abdomen various times throughout the day.  Patient states pain is not aggravated by eating, denies nausea and vomiting.  Patient denies diarrhea, history of constipation.  Patient states the first day of her last period was November 8, states the pain she is having at this time is different from menstrual cramping.  The history is provided by the patient.   Past Medical History:  Diagnosis Date   Allergic rhinitis 03/10/2015   Arthritis 11/08/2015   HTN (hypertension) 04/29/2014   Hypertension    Vaginitis 04/29/2014   Vitamin D deficiency 05/31/2015   Patient Active Problem List   Diagnosis Date Noted   Morbid obesity (HCC) 06/20/2021   Arthritis 11/08/2015   Vitamin D deficiency 05/31/2015   Allergic rhinitis 03/10/2015   Contraceptive management 01/22/2015   Routine general medical examination at a health care facility 05/22/2014   HTN (hypertension) 04/29/2014   Past Surgical History:  Procedure Laterality Date   HIP SURGERY Right 1992   States that bones were slipping. Stabilized it with 3 screws x 1 yr then had screws removed.   OB History   No obstetric history on file.     Home Medications    Prior to Admission medications   Medication Sig Start Date End Date Taking? Authorizing Provider  amLODipine (NORVASC) 10 MG tablet TAKE 1 TABLET BY MOUTH ONCE DAILY 07/16/22   Sandford Craze, NP  Cholecalciferol (VITAMIN D3) 3000 UNITS TABS Take 1 tablet by mouth daily. 08/17/15   Sandford Craze, NP  meloxicam (MOBIC) 7.5 MG tablet Take by mouth. 06/14/21   [provider]  metoprolol succinate (TOPROL-XL) 50 MG 24 hr tablet TAKE 1 TABLET BY MOUTH ONCE DAILY. TAKE WITH OR IMMEDIATELY FOLLOWING A MEAL 07/16/22   Sandford Craze, NP  Multiple Vitamins-Minerals (MULTIVITAMIN WITH MINERALS) tablet Take 1 tablet by mouth daily.    [provider]  prochlorperazine (COMPAZINE) 10 MG/2ML injection  10/17/21   [provider]  rizatriptan (MAXALT) 10 MG tablet rizatriptan 10 mg tablet 03/26/18   [provider]  topiramate (TOPAMAX) 100 MG tablet Take 100 mg by mouth daily.    [provider]    Family History Family History  Problem Relation Age of Onset   Hypertension Mother        died in MVA in her 55's   Hypertension Father        had ESRD, die due to infection from his graft   Diabetes Father    Stroke Father    Heart attack Maternal Uncle    Social History Social History   Tobacco Use   Smoking status: Never   Smokeless tobacco: Never  Substance Use Topics   Alcohol use: No   Drug use: No   Allergies   Patient has no known allergies.  Review of Systems Review of Systems Pertinent findings revealed after performing a 14 point review of systems has been noted in the history of present illness.  Physical Exam Triage Vital Signs ED Triage Vitals  Enc Vitals Group     BP 07/01/21 0827 (!) 147/82     Pulse Rate 07/01/21 0827 72     Resp 07/01/21 0827 18     Temp 07/01/21 0827 98.3 F (36.8 C)     Temp Source 07/01/21 0827 Oral     SpO2 07/01/21 0827 98 %     Weight --      Height --       Head Circumference --      Peak Flow --      Pain Score 07/01/21 0826 5     Pain Loc --      Pain Edu? --      Excl. in [place]? --   No data found.  Updated Vital Signs BP (!) 136/98 (BP Location: Right Arm)   Pulse 80   Temp 98.2 F (36.8 C) (Oral)   Resp 16   LMP 07/12/2022   SpO2 98%   Physical Exam  Visual Acuity Right Eye Distance:   Left Eye Distance:   Bilateral Distance:    Right Eye Near:   Left Eye Near:    Bilateral Near:     [facility] / Diagnostics / Procedures:     Radiology DG Abd 1 View  Result Date: 07/22/2022 CLINICAL DATA:  Decreased bowel sounds in the right lower quadrant. EXAM: ABDOMEN - 1 VIEW COMPARISON:  None Available. FINDINGS: The bowel gas pattern is normal. No radio-opaque calculi or other significant radiographic abnormality are seen. IMPRESSION: Negative. Electronically Signed   By: Dorise Bullion III M.D.   On: 07/22/2022 10:56    Procedures Procedures (including critical care time) EKG  Pending results:  Labs Reviewed - No data to display  Medications Ordered in UC: Medications - No data to display  UC Diagnoses / Final Clinical Impressions(s)   I have reviewed the triage vital signs and the nursing notes.  Pertinent labs & imaging results that were available during my care of the patient were reviewed by me and considered in my medical decision making (see chart for details).    Final diagnoses:  Allergic rhinitis, unspecified seasonality, unspecified trigger  Constipation, unspecified constipation type   Moderate burden appreciated on abdominal plain film x-ray per my personal review.  Patient advised to perform bowel cleanout with MiraLAX and Gatorade, please see instructions provided to patient in discharge instructions below.  Patient provided with Flonase to take in addition to her cetirizine daily and advised to take ibuprofen 40 mg 3 times daily to also reduce respiratory inflammation.   ED Prescriptions      Medication Sig Dispense Auth. Provider   cetirizine (ZYRTEC ALLERGY) 10 MG tablet Take 1 tablet (10 mg total) by mouth at bedtime. 90 tablet Lynden Oxford Scales, PA-C   fluticasone (FLONASE) 50 MCG/ACT nasal spray Place 1 spray into both nostrils daily. Begin by using 2 sprays in each nare daily for 3 to 5 days, then decrease to 1 spray in each nare daily. 15.8 mL Lynden Oxford Scales, PA-C   ibuprofen (ADVIL) 400 MG tablet Take 1 tablet (400 mg total) by mouth every 8 (eight) hours as needed for  up to 30 doses. 30 tablet Lynden Oxford Scales, PA-C   methylPREDNISolone (MEDROL) 8 MG tablet Take 1 tablet (8 mg total) by mouth daily for 3 days. 3 tablet Lynden Oxford Scales, PA-C      PDMP not reviewed this encounter.  Disposition Upon Discharge:  Condition: stable for discharge home Home: take medications as prescribed; routine discharge instructions as discussed; follow up as advised.  Patient presented with an acute illness with associated systemic symptoms and significant discomfort requiring urgent management. In my opinion, this is a condition that a prudent lay person (someone who possesses an average knowledge of health and medicine) may potentially expect to result in complications if not addressed urgently such as respiratory distress, impairment of bodily function or dysfunction of bodily organs.   Routine symptom specific, illness specific and/or disease specific instructions were discussed with the patient and/or caregiver at length.   As such, the patient has been evaluated and assessed, work-up was performed and treatment was provided in alignment with urgent care protocols and evidence based medicine.  Patient/parent/caregiver has been advised that the patient may require follow up for further testing and treatment if the symptoms continue in spite of treatment, as clinically indicated and appropriate.  If the patient was tested for COVID-19, Influenza and/or RSV, then the  patient/parent/guardian was advised to isolate at home pending the results of his/her diagnostic coronavirus test and potentially longer if they're positive. I have also advised pt that if his/her COVID-19 test returns positive, it's recommended to self-isolate for at least 10 days after symptoms first appeared AND until fever-free for 24 hours without fever reducer AND other symptoms have improved or resolved. Discussed self-isolation recommendations as well as instructions for household member/close contacts as per the [facility] and [place] [facility], and also gave patient the [place] packet with this information.  Patient/parent/caregiver has been advised to return to the [facility] or PCP in 3-5 days if no better; to PCP or the Emergency Department if new signs and symptoms develop, or if the current signs or symptoms continue to change or worsen for further workup, evaluation and treatment as clinically indicated and appropriate  The patient will follow up with their current PCP if and as advised. If the patient does not currently have a PCP we will assist them in obtaining one.   The patient may need specialty follow up if the symptoms continue, in spite of conservative treatment and management, for further workup, evaluation, consultation and treatment as clinically indicated and appropriate.  Patient/parent/caregiver verbalized understanding and agreement of plan as discussed.  All questions were addressed during visit.  Please see discharge instructions below for further details of plan.  Discharge Instructions:   Discharge Instructions      Your symptoms and my physical exam findings are concerning for exacerbation of your underlying allergies.     Please see the list below for recommended medications, dosages and frequencies to provide relief of current symptoms:     Medrol (methylprednisolone): This is a steroid that will significantly calm your upper and lower airways, please take the daily recommended  quantity of tablets daily with your breakfast meal starting tomorrow morning until the prescription is complete.      Zyrtec (cetirizine): This is an excellent second-generation antihistamine that helps to reduce respiratory inflammatory response to environmental allergens.  In some patients, this medication can cause daytime sleepiness so I recommend that you take 1 tablet daily at bedtime.    Flonase (fluticasone): This is a steroid nasal  spray that you use once daily, 1 spray in each nare.  This medication does not work well if you decide to use it only used as you feel you need to, it works best used on a daily basis.  After 3 to 5 days of use, you will notice significant reduction of the inflammation and mucus production that is currently being caused by exposure to allergens, whether seasonal or environmental.  The most common side effect of this medication is nosebleeds.  If you experience a nosebleed, please discontinue use for 1 week, then feel free to resume.  I have provided you with a prescription.     If you find that your health insurance will not pay for allergy medications, please consider downloading the GoodRx app and using to get a better price than the "off the shelf" price.       Your symptoms and my physical exam findings are also concerning for constipation.  Your x-ray confirms that you have a significant amount of retained stool that needs to be "moved along".  I recommend the following treatment.  Please purchase 32 ounces of your favorite flavor of Gatorade and mix in 250 g of MiraLAX, also known as polyethylene glycol.  It is not important to buy the brand name, the generic version will do.  When you have mixed them together and the MiraLAX powder is completely dissolved, please consume the entire 32 ounces within 1 hour.  Within a few hours, perhaps a little more or a little less, you should feel a significant urge to move your bowels and be able to produce a significant  amount of stool.  If you feel you have had significant production of stool there is no need to repeat this process but if you feel that your results have been less than satisfying, you can certainly repeat this process 6 hours after the first round.   Thank you for visiting urgent care.  Please let us know if there is anything else we can do for you.      This office note has been dictated using Museum/gallery curator.  Unfortunately, this method of dictation can sometimes lead to typographical or grammatical errors.  I apologize for your inconvenience in advance if this occurs.  Please do not hesitate to reach out to me if clarification is needed.      Lynden Oxford Scales, PA-C 07/22/22 1042    Lynden Oxford Stokes, PA-C 07/22/22 1045    Lynden Oxford [place], [place] 07/22/22 1059

## 2022-07-22 NOTE — Discharge Instructions (Addendum)
Your symptoms and my physical exam findings are concerning for exacerbation of your underlying allergies.     Please see the list below for recommended medications, dosages and frequencies to provide relief of current symptoms:     Medrol (methylprednisolone): This is a steroid that will significantly calm your upper and lower airways, please take the daily recommended quantity of tablets daily with your breakfast meal starting tomorrow morning until the prescription is complete.      Zyrtec (cetirizine): This is an excellent second-generation antihistamine that helps to reduce respiratory inflammatory response to environmental allergens.  In some patients, this medication can cause daytime sleepiness so I recommend that you take 1 tablet daily at bedtime.    Flonase (fluticasone): This is a steroid nasal spray that you use once daily, 1 spray in each nare.  This medication does not work well if you decide to use it only used as you feel you need to, it works best used on a daily basis.  After 3 to 5 days of use, you will notice significant reduction of the inflammation and mucus production that is currently being caused by exposure to allergens, whether seasonal or environmental.  The most common side effect of this medication is nosebleeds.  If you experience a nosebleed, please discontinue use for 1 week, then feel free to resume.  I have provided you with a prescription.     If you find that your health insurance will not pay for allergy medications, please consider downloading the GoodRx app and using to get a better price than the "off the shelf" price.       Your symptoms and my physical exam findings are also concerning for constipation.  Your x-ray confirms that you have a significant amount of retained stool that needs to be "moved along".  I recommend the following treatment.  Please purchase 32 ounces of your favorite flavor of Gatorade and mix in 250 g of MiraLAX, also known as  polyethylene glycol.  It is not important to buy the brand name, the generic version will do.  When you have mixed them together and the MiraLAX powder is completely dissolved, please consume the entire 32 ounces within 1 hour.  Within a few hours, perhaps a little more or a little less, you should feel a significant urge to move your bowels and be able to produce a significant amount of stool.  If you feel you have had significant production of stool there is no need to repeat this process but if you feel that your results have been less than satisfying, you can certainly repeat this process 6 hours after the first round.   Thank you for visiting urgent care.  Please let us know if there is anything else we can do for you.

## 2022-08-22 ENCOUNTER — Encounter: Payer: Self-pay | Admitting: Family

## 2022-08-22 ENCOUNTER — Ambulatory Visit: Payer: BC Managed Care – PPO | Admitting: Family

## 2022-08-22 VITALS — BP 131/76 | HR 69 | Temp 97.9°F | Resp 16 | Ht 67.0 in | Wt 316.0 lb

## 2022-08-22 DIAGNOSIS — E559 Vitamin D deficiency, unspecified: Secondary | ICD-10-CM | POA: Diagnosis not present

## 2022-08-22 DIAGNOSIS — I10 Essential (primary) hypertension: Secondary | ICD-10-CM

## 2022-08-22 DIAGNOSIS — G43909 Migraine, unspecified, not intractable, without status migrainosus: Secondary | ICD-10-CM | POA: Diagnosis not present

## 2022-08-22 LAB — BASIC METABOLIC PANEL
BUN: 10 mg/dL (ref 6–23)
CO2: 27 mEq/L (ref 19–32)
Calcium: 9 mg/dL (ref 8.4–10.5)
Chloride: 102 mEq/L (ref 96–112)
Creatinine, Ser: 0.92 mg/dL (ref 0.40–1.20)
GFR: 75.85 mL/min (ref >60.00)
Glucose, Bld: 79 mg/dL (ref 70–99)
Potassium: 3.8 mEq/L (ref 3.5–5.1)
Sodium: 138 mEq/L (ref 135–145)

## 2022-08-22 LAB — VITAMIN D 25 HYDROXY (VIT D DEFICIENCY, FRACTURES): VITD: 51.86 ng/mL (ref 30.00–100.00)

## 2022-08-22 MED ORDER — METOPROLOL SUCCINATE ER 50 MG PO TB24: ORAL_TABLET | ORAL | 1 refills | Status: DC

## 2022-08-22 MED ORDER — AMLODIPINE BESYLATE 10 MG PO TABS: 10.0000 mg | ORAL_TABLET | Freq: Every day | ORAL | 1 refills | Status: DC

## 2022-08-22 NOTE — Assessment & Plan Note (Signed)
On supplement. Check follow up vit D.

## 2022-08-22 NOTE — Assessment & Plan Note (Signed)
Stable on topamax and prn maxalt- being managed by [facility].

## 2022-08-22 NOTE — Progress Notes (Signed)
Subjective:   By signing my name below, I, Cassell Clement, attest that this documentation has been prepared under the direction and in the presence of Alma Downs' [place], NP 08/22/2022   Patient ID: Shelia Mcgee, female    DOB: Jul 16, 1976, 46 y.o.   MRN: [id number]  Chief Complaint  Patient presents with   Hypertension    Here for follow up    HPI Patient is in today for an office visit  Refill: She is requesting a refill of 10 mg of Amlodipine and 50 mg of Metoprolol Succinate.   Blood Pressure: As of today's visit, her blood pressure is normal. She is currently taking 10 mg of Amlodipine and 50 mg of Metoprolol Succinate.  BP Readings from Last 3 Encounters:  08/22/22 131/76  07/22/22 (!) 136/98  01/10/22 113/75   Pulse Readings from Last 3 Encounters:  08/22/22 69  07/22/22 80  01/10/22 68   Migraines: She is regularly following up with Adriana Simas NP for her neurological symptoms. She states that her migraines has been "few and far between." She is currently taking 100 mg Topamax and 10 mg of Maxalt PRN.  Allergies: She denies of any recent allergy issues  Vitamin D: She is continuing to taking 3000 units of Vitamin D3 supplements.   Immunizations: She reports that she is UTD on the Influenza vaccine.   Health Maintenance Due  Topic Date Due   COVID-19 Vaccine (4 - 2023-24 season) 05/05/2022    Past Medical History:  Diagnosis Date   Allergic rhinitis 03/10/2015   Arthritis 11/08/2015   HTN (hypertension) 04/29/2014   Hypertension    Vaginitis 04/29/2014   Vitamin D deficiency 05/31/2015    Past Surgical History:  Procedure Laterality Date   HIP SURGERY Right 1992   States that bones were slipping. Stabilized it with 3 screws x 1 yr then had screws removed.    Family History  Problem Relation Age of Onset   Hypertension Mother        died in MVA in her 33's   Hypertension Father        had ESRD, die due to infection from his graft   Diabetes Father    Stroke  Father    Heart attack Maternal Uncle     Social History   Socioeconomic History   Marital status: Single    Spouse name: Not on file   Number of children: Not on file   Years of education: Not on file   Highest education level: Not on file  Occupational History   Not on file  Tobacco Use   Smoking status: Never   Smokeless tobacco: Never  Substance and Sexual Activity   Alcohol use: No   Drug use: No   Sexual activity: Not Currently    Birth control/protection: None  Other Topics Concern   Not on file  Social History Narrative   Lives alone, single   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 90 tablet 1   Cholecalciferol (VITAMIN D3) 3000 UNITS TABS Take 1 tablet by mouth daily. 30 tablet    fluticasone (FLONASE) 50 MCG/ACT nasal spray Place 1 spray into both nostrils daily. Begin by using 2 sprays in each nare daily for 3 to 5 days, then decrease to 1 spray in each nare daily. 15.8 mL 2   ibuprofen (ADVIL) 400 MG tablet Take 1 tablet (400 mg total) by mouth every 8 (eight) hours as needed for up to 30 doses. 30 tablet 0   Multiple Vitamins-Minerals (MULTIVITAMIN WITH MINERALS) tablet Take 1 tablet by mouth daily.     prochlorperazine (COMPAZINE) 10 MG/2ML injection      rizatriptan (MAXALT) 10 MG tablet rizatriptan 10 mg tablet     topiramate (TOPAMAX) 100 MG tablet Take 100 mg by mouth daily.     amLODipine (NORVASC) 10 MG tablet TAKE 1  TABLET BY MOUTH ONCE DAILY 90 tablet 0   metoprolol succinate (TOPROL-XL) 50 MG 24 hr tablet TAKE 1 TABLET BY MOUTH ONCE DAILY. TAKE WITH OR IMMEDIATELY FOLLOWING A MEAL 90 tablet 0   meloxicam (MOBIC) 7.5 MG tablet Take by mouth.     No facility-administered medications prior to visit.    No Known Allergies  ROS    See HPI Objective:    Physical Exam Constitutional:      General: [name].    Appearance: Normal appearance. She is not ill-appearing.  HENT:     Head: Normocephalic and atraumatic.     Right Ear: External ear normal.     Left Ear: External ear normal.  Eyes:     Extraocular Movements: Extraocular movements intact.     Pupils: Pupils are equal, round, and reactive to light.  Cardiovascular:     Rate and Rhythm: Normal rate and regular rhythm.     Heart sounds: Normal heart sounds. No murmur heard.    No gallop.  Pulmonary:     Effort: Pulmonary effort is normal. No respiratory distress.     Breath sounds: Normal breath sounds. No wheezing or rales.  Skin:    General: Skin is warm and dry.  Neurological:     Mental Status: [name] and oriented to person, place, and time.  Psychiatric:        Judgment: Judgment normal.     BP 131/76 (BP Location: Right Arm, Patient Position: Sitting, Cuff Size: Small)   Pulse 69   Temp 97.9 F (36.6 C) (Oral)   Resp 16   Ht 5\' 7"  (1.702 m)   Wt (!) 316 lb (143.3 kg)   SpO2 100%   BMI 49.49 kg/m  Wt Readings from Last 3 Encounters:  08/22/22 (!) 316 lb (143.3 kg)  01/10/22 291 lb (132 kg)  06/20/21 285 lb (129.3 kg)       Assessment & Plan:   Problem List Items Addressed This Visit       Unprioritized   Vitamin D deficiency    On supplement. Check follow up vit D.       Relevant Orders   VITAMIN D 25 Hydroxy (Vit-D Deficiency, Fractures)   Migraine - Primary    Stable on topamax and prn maxalt- being managed by neuroogy.        Relevant Medications   metoprolol succinate  (TOPROL-XL) 50 MG 24 hr tablet   amLODipine (NORVASC) 10 MG tablet   HTN (hypertension)  Stable on amlodipine and toprol xl. Continue same.       Relevant Medications   metoprolol succinate (TOPROL-XL) 50 MG 24 hr tablet   amLODipine (NORVASC) 10 MG tablet   Other Relevant Orders   Basic Metabolic Panel (BMET)   Meds ordered this encounter  Medications   metoprolol succinate (TOPROL-XL) 50 MG 24 hr tablet    Sig: TAKE 1 TABLET BY MOUTH ONCE DAILY. TAKE WITH OR IMMEDIATELY FOLLOWING A MEAL    Dispense:  90 tablet    Refill:  1    Order Specific Question:   Supervising Provider    Answer:   Danise Edge A [[id number]]   amLODipine (NORVASC) 10 MG tablet    Sig: Take 1 tablet (10 mg total) by mouth daily.    Dispense:  90 tablet    Refill:  1    Order Specific Question:   Supervising Provider    Answer:   Danise Edge A [[id number]]    I, Lemont Fillers, NP, personally preformed the services described in this documentation.  All medical record entries made by the scribe were at my direction and in my presence.  I have reviewed the chart and discharge instructions (if applicable) and agree that the record reflects my personal performance and is accurate and complete. 08/22/2022   I,Amber Collins,acting as a scribe for Lemont Fillers, NP.,have documented all relevant documentation on the behalf of Lemont Fillers, NP,as directed by  Lemont Fillers, NP while in the presence of Lemont Fillers, NP.    Lemont Fillers, NP

## 2022-08-22 NOTE — Assessment & Plan Note (Signed)
Stable on amlodipine and toprol xl. Continue same.

## 2022-09-11 ENCOUNTER — Ambulatory Visit
Admission: EM | Admit: 2022-09-11 | Discharge: 2022-09-11 | Disposition: A | Discharge status: Home / Self Care | Payer: BC Managed Care – PPO | Attending: Emergency Medicine | Admitting: Emergency Medicine

## 2022-09-11 DIAGNOSIS — J4521 Mild intermittent asthma with (acute) exacerbation: Secondary | ICD-10-CM

## 2022-09-11 DIAGNOSIS — J309 Allergic rhinitis, unspecified: Secondary | ICD-10-CM

## 2022-09-11 MED ORDER — ALBUTEROL SULFATE (2.5 MG/3ML) 0.083% IN NEBU
2.5000 mg | INHALATION_SOLUTION | Freq: Once | RESPIRATORY_TRACT | Status: AC
  Administered 2022-09-11: 2.5 mg via RESPIRATORY_TRACT

## 2022-09-11 MED ORDER — ALBUTEROL SULFATE HFA 108 (90 BASE) MCG/ACT IN AERS: 2.0000 | INHALATION_SPRAY | Freq: Four times a day (QID) | RESPIRATORY_TRACT | 1 refills | Status: DC | PRN

## 2022-09-11 MED ORDER — PROMETHAZINE-DM 6.25-15 MG/5ML PO SYRP: 5.0000 mL | ORAL_SOLUTION | Freq: Every evening | ORAL | 0 refills | Status: DC | PRN

## 2022-09-11 MED ORDER — TRIAMCINOLONE ACETONIDE 40 MG/ML IJ SUSP
60.0000 mg | Freq: Once | INTRAMUSCULAR | Status: AC
  Administered 2022-09-11: 60 mg via INTRAMUSCULAR

## 2022-09-11 NOTE — Discharge Instructions (Addendum)
Please read below to learn more about the medications, dosages and frequencies that I recommend to help alleviate your symptoms and to get you feeling better soon:   Kenalog IM (triamcinolone):  To quickly address your significant respiratory inflammation, you were provided with an injection of Kenalog in the office today.  You should continue to feel the full benefit of the steroid for the next 24-36 hours.    Zyrtec (cetirizine): This is an excellent second-generation antihistamine that helps to reduce respiratory inflammatory response to environmental allergens.  In some patients, this medication can cause daytime sleepiness so I recommend that you take 1 tablet daily at bedtime.     Flonase (fluticasone): This is a steroid nasal spray that you use once daily, 1 spray in each nare.  This medication does not work well if you decide to use it only used as you feel you need to, it works best used on a daily basis.  After 3 to 5 days of use, you will notice significant reduction of the inflammation and mucus production that is currently being caused by exposure to allergens, whether seasonal or environmental.  The most common side effect of this medication is nosebleeds.  If you experience a nosebleed, please discontinue use for 1 week, then feel free to resume.  I have provided you with a prescription.     ProAir, Ventolin, Proventil (albuterol): This inhaled medication contains a short acting beta agonist bronchodilator.  This medication works on the smooth muscle that opens and constricts of your airways by relaxing the muscle.  The result of relaxation of the smooth muscle is increased air movement and improved work of breathing.  This is a short acting medication that can be used every 4-6 hours as needed for increased work of breathing, shortness of breath, wheezing and excessive coughing.  I have provided you with a prescription.   Promethazine DM: Promethazine is both a nasal decongestant and an  antinausea medication that makes most patients feel fairly sleepy.  The DM is dextromethorphan, a cough suppressant found in many over-the-counter cough medications.  Please take 5 mL before bedtime to minimize your cough which will help you sleep better.  I have sent a prescription for this medication to your pharmacy.   If you find that you have not had improvement of your symptoms in the next 5 to 7 days, please follow-up with your primary care provider or return here to urgent care for repeat evaluation and further recommendations.   Thank you for visiting urgent care today.  We appreciate the opportunity to participate in your care.

## 2022-09-11 NOTE — ED Provider Notes (Signed)
[facility]    CSN: [id number] Arrival date & time: 09/11/22  1723    HISTORY   Chief Complaint  Patient presents with   Cough   Facial Pain   Sore Throat   Chills   HPI Shelia Mcgee is a pleasant, 47 y.o. female who presents to urgent care today. Patient complains of a 3-day history of bilateral facial pain and pressure behind and around both eyes and a sore throat that feels like razor blades as well as a nonproductive cough that began today.  Patient has a temperature of 99 on arrival with elevated blood pressure, vital signs are otherwise normal.  Patient was seen by me at this location July 22, 2022, at that time she was diagnosed with and treated for allergic rhinitis and advised to take cetirizine and Flonase.  Today, patient states she is still taking both medications.  Patient states she is also been taking Mucinex but does not feel like this is helping very much.  The history is provided by the patient.   Past Medical History:  Diagnosis Date   Allergic rhinitis 03/10/2015   Arthritis 11/08/2015   HTN (hypertension) 04/29/2014   Hypertension    Vaginitis 04/29/2014   Vitamin D deficiency 05/31/2015   Patient Active Problem List   Diagnosis Date Noted   Migraine 08/22/2022   Morbid obesity (HCC) 06/20/2021   Arthritis 11/08/2015   Vitamin D deficiency 05/31/2015   Allergic rhinitis 03/10/2015   Contraceptive management 01/22/2015   Routine general medical examination at a health care facility 05/22/2014   HTN (hypertension) 04/29/2014   Past Surgical History:  Procedure Laterality Date   HIP SURGERY Right 1992   States that bones were slipping. Stabilized it with 3 screws x 1 yr then had screws removed.   OB History   No obstetric history on file.    Home Medications    Prior to Admission medications   Medication Sig Start Date End Date Taking? Authorizing Provider  amLODipine (NORVASC) 10 MG tablet Take 1 tablet (10 mg total) by mouth daily.  08/22/22   Sandford Craze, NP  cetirizine (ZYRTEC ALLERGY) 10 MG tablet Take 1 tablet (10 mg total) by mouth at bedtime. 07/22/22 01/18/23  Theadora Rama Scales, PA-C  Cholecalciferol (VITAMIN D3) 3000 UNITS TABS Take 1 tablet by mouth daily. 08/17/15   Sandford Craze, NP  fluticasone (FLONASE) 50 MCG/ACT nasal spray Place 1 spray into both nostrils daily. Begin by using 2 sprays in each nare daily for 3 to 5 days, then decrease to 1 spray in each nare daily. 07/22/22   Theadora Rama Scales, PA-C  ibuprofen (ADVIL) 400 MG tablet Take 1 tablet (400 mg total) by mouth every 8 (eight) hours as needed for up to 30 doses. 07/22/22   Theadora Rama Scales, PA-C  metoprolol succinate (TOPROL-XL) 50 MG 24 hr tablet TAKE 1 TABLET BY MOUTH ONCE DAILY. TAKE WITH OR IMMEDIATELY FOLLOWING A MEAL 08/22/22   Sandford Craze, NP  Multiple Vitamins-Minerals (MULTIVITAMIN WITH MINERALS) tablet Take 1 tablet by mouth daily.    [provider]  prochlorperazine (COMPAZINE) 10 MG/2ML injection  10/17/21   [provider]  rizatriptan (MAXALT) 10 MG tablet rizatriptan 10 mg tablet 03/26/18   [provider]  topiramate (TOPAMAX) 100 MG tablet Take 100 mg by mouth daily.    [provider]    Family History Family History  Problem Relation Age of Onset   Hypertension Mother  died in MVA in her 30's   Hypertension Father        had ESRD, die due to infection from his graft   Diabetes Father    Stroke Father    Heart attack Maternal Uncle    Social History Social History   Tobacco Use   Smoking status: Never   Smokeless tobacco: Never  Substance Use Topics   Alcohol use: No   Drug use: No   Allergies   Patient has no known allergies.  Review of Systems Review of Systems Pertinent findings revealed after performing a 14 point review of systems has been noted in the history of present illness.  Physical Exam Triage Vital Signs ED Triage Vitals   Enc Vitals Group     BP 07/01/21 0827 (!) 147/82     Pulse Rate 07/01/21 0827 72     Resp 07/01/21 0827 18     Temp 07/01/21 0827 98.3 F (36.8 C)     Temp Source 07/01/21 0827 Oral     SpO2 07/01/21 0827 98 %     Weight --      Height --      Head Circumference --      Peak Flow --      Pain Score 07/01/21 0826 5     Pain Loc --      Pain Edu? --      Excl. in [place]? --   No data found.  Updated Vital Signs BP (!) 134/90 (BP Location: Right Arm)   Pulse 82   Temp 99 F (37.2 C) (Oral)   Resp 18   LMP 09/02/2022   SpO2 97%   Physical Exam Vitals and nursing note reviewed.  Constitutional:      General: [name].    Appearance: Normal appearance. She is not ill-appearing.  HENT:     Head: Normocephalic and atraumatic.     Salivary Glands: Right salivary gland is not diffusely enlarged or tender. Left salivary gland is not diffusely enlarged or tender.     Right Ear: Hearing, ear canal and external ear normal. No drainage. No middle ear effusion. There is no impacted cerumen. Tympanic membrane is bulging. Tympanic membrane is not injected or erythematous.     Left Ear: Hearing, ear canal and external ear normal. No drainage.  No middle ear effusion. There is no impacted cerumen. Tympanic membrane is bulging. Tympanic membrane is not injected or erythematous.     Ears:     Comments: Bilateral EACs normal, both TMs bulging with clear fluid    Nose: Rhinorrhea present. No nasal deformity, septal deviation, signs of injury, nasal tenderness, mucosal edema or congestion. Rhinorrhea is clear.     Right Nostril: Occlusion present. No foreign body, epistaxis or septal hematoma.     Left Nostril: Occlusion present. No foreign body, epistaxis or septal hematoma.     Right Turbinates: Enlarged, swollen and pale.     Left Turbinates: Enlarged, swollen and pale.     Right Sinus: Maxillary sinus tenderness and frontal sinus tenderness present.     Left Sinus: Maxillary  sinus tenderness and frontal sinus tenderness present.     Mouth/Throat:     Lips: Pink. No lesions.     Mouth: Mucous membranes are moist. No oral lesions.     Tongue: No lesions. Tongue does not deviate from midline.     Palate: No mass and lesions.     Pharynx: Uvula midline. Pharyngeal  swelling and posterior oropharyngeal erythema present. No oropharyngeal exudate or uvula swelling.     Tonsils: No tonsillar exudate. 0 on the right. 0 on the left.  Eyes:     General: Lids are normal.        Right eye: No discharge.        Left eye: No discharge.     Extraocular Movements: Extraocular movements intact.     Conjunctiva/sclera: Conjunctivae normal.     Right eye: Right conjunctiva is not injected.     Left eye: Left conjunctiva is not injected.  Neck:     Trachea: Trachea and phonation normal.  Cardiovascular:     Rate and Rhythm: Normal rate and regular rhythm.     Pulses: Normal pulses.     Heart sounds: Normal heart sounds, S1 normal and S2 normal. Heart sounds not distant. No murmur heard.    No friction rub. No gallop.  Pulmonary:     Effort: Pulmonary effort is normal. No tachypnea, bradypnea, accessory muscle usage, prolonged expiration, respiratory distress or retractions.     Breath sounds: No stridor, decreased air movement or transmitted upper airway sounds. Examination of the right-upper field reveals decreased breath sounds. Examination of the left-upper field reveals decreased breath sounds. Examination of the right-middle field reveals decreased breath sounds. Examination of the left-middle field reveals decreased breath sounds. Examination of the right-lower field reveals decreased breath sounds. Examination of the left-lower field reveals decreased breath sounds. Decreased breath sounds present. No wheezing, rhonchi or rales.  Chest:     Chest wall: No tenderness.  Musculoskeletal:        General: Normal range of motion.     Cervical back: Full passive range of motion  without pain, normal range of motion and neck supple. Normal range of motion.  Lymphadenopathy:     Cervical: No cervical adenopathy.  Skin:    General: Skin is warm and dry.     Findings: No erythema or rash.  Neurological:     General: No focal deficit present.     Mental Status: [name] and oriented to person, place, and time.  Psychiatric:        Mood and Affect: Mood normal.        Behavior: Behavior normal.     Visual Acuity Right Eye Distance:   Left Eye Distance:   Bilateral Distance:    Right Eye Near:   Left Eye Near:    Bilateral Near:     UC Couse / Diagnostics / Procedures:     Radiology No results found.  Procedures Procedures (including critical care time) EKG  Pending results:  Labs Reviewed - No data to display  Medications Ordered in UC: Medications  triamcinolone acetonide (KENALOG-40) injection 60 mg (has no administration in time range)  albuterol (PROVENTIL) (2.5 MG/3ML) 0.083% nebulizer solution 2.5 mg (2.5 mg Nebulization Given 09/11/22 1919)    UC Diagnoses / Final Clinical Impressions(s)   I have reviewed the triage vital signs and the nursing notes.  Pertinent labs & imaging results that were available during my care of the patient were reviewed by me and considered in my medical decision making (see chart for details).    Final diagnoses:  Allergic rhinitis, unspecified seasonality, unspecified trigger  Mild intermittent reactive airway disease with acute exacerbation   Breath sounds improved after nebulized albuterol treatment.  Plan to continue albuterol 2 puffs every 4-6 hours as needed.  Promethazine DM provided for nighttime cough.  Patient  provided with a Kenalog injection during her visit today to calm respiratory inflammation.  Patient advised to continue using Zyrtec and Flonase as prescribed previously.  Return precautions advised. Please see discharge instructions below for further details of plan of care as provided to  patient. ED Prescriptions     Medication Sig Dispense Auth. Provider   albuterol (VENTOLIN HFA) 108 (90 Base) MCG/ACT inhaler Inhale 2 puffs into the lungs every 6 (six) hours as needed for wheezing or shortness of breath (Cough). 36 g Theadora Rama Scales, PA-C   promethazine-dextromethorphan (PROMETHAZINE-DM) 6.25-15 MG/5ML syrup Take 5 mLs by mouth at bedtime as needed for cough. 60 mL Theadora Rama Scales, PA-C      PDMP not reviewed this encounter.  Disposition Upon Discharge:  Condition: stable for discharge home Home: take medications as prescribed; routine discharge instructions as discussed; follow up as advised.  Patient presented with an acute illness with associated systemic symptoms and significant discomfort requiring urgent management. In my opinion, this is a condition that a prudent lay person (someone who possesses an average knowledge of health and medicine) may potentially expect to result in complications if not addressed urgently such as respiratory distress, impairment of bodily function or dysfunction of bodily organs.   Routine symptom specific, illness specific and/or disease specific instructions were discussed with the patient and/or caregiver at length.   As such, the patient has been evaluated and assessed, work-up was performed and treatment was provided in alignment with urgent care protocols and evidence based medicine.  Patient/parent/caregiver has been advised that the patient may require follow up for further testing and treatment if the symptoms continue in spite of treatment, as clinically indicated and appropriate.  If the patient was tested for COVID-19, Influenza and/or RSV, then the patient/parent/guardian was advised to isolate at home pending the results of his/her diagnostic coronavirus test and potentially longer if they're positive. I have also advised pt that if his/her COVID-19 test returns positive, it's recommended to self-isolate for at  least 10 days after symptoms first appeared AND until fever-free for 24 hours without fever reducer AND other symptoms have improved or resolved. Discussed self-isolation recommendations as well as instructions for household member/close contacts as per the [facility] and [place] DHHS, and also gave patient the COVID packet with this information.  Patient/parent/caregiver has been advised to return to the [facility] or PCP in 3-5 days if no better; to PCP or the Emergency Department if new signs and symptoms develop, or if the current signs or symptoms continue to change or worsen for further workup, evaluation and treatment as clinically indicated and appropriate  The patient will follow up with their current PCP if and as advised. If the patient does not currently have a PCP we will assist them in obtaining one.   The patient may need specialty follow up if the symptoms continue, in spite of conservative treatment and management, for further workup, evaluation, consultation and treatment as clinically indicated and appropriate.  Patient/parent/caregiver verbalized understanding and agreement of plan as discussed.  All questions were addressed during visit.  Please see discharge instructions below for further details of plan.  Discharge Instructions:   Discharge Instructions      Please read below to learn more about the medications, dosages and frequencies that I recommend to help alleviate your symptoms and to get you feeling better soon:   Kenalog IM (triamcinolone):  To quickly address your significant respiratory inflammation, you were provided with an injection of Kenalog in the  office today.  You should continue to feel the full benefit of the steroid for the next 24-36 hours.    Zyrtec (cetirizine): This is an excellent second-generation antihistamine that helps to reduce respiratory inflammatory response to environmental allergens.  In some patients, this medication can cause daytime sleepiness so I  recommend that you take 1 tablet daily at bedtime.     Flonase (fluticasone): This is a steroid nasal spray that you use once daily, 1 spray in each nare.  This medication does not work well if you decide to use it only used as you feel you need to, it works best used on a daily basis.  After 3 to 5 days of use, you will notice significant reduction of the inflammation and mucus production that is currently being caused by exposure to allergens, whether seasonal or environmental.  The most common side effect of this medication is nosebleeds.  If you experience a nosebleed, please discontinue use for 1 week, then feel free to resume.  I have provided you with a prescription.     ProAir, Ventolin, Proventil (albuterol): This inhaled medication contains a short acting beta agonist bronchodilator.  This medication works on the smooth muscle that opens and constricts of your airways by relaxing the muscle.  The result of relaxation of the smooth muscle is increased air movement and improved work of breathing.  This is a short acting medication that can be used every 4-6 hours as needed for increased work of breathing, shortness of breath, wheezing and excessive coughing.  I have provided you with a prescription.    If you find that you have not had improvement of your symptoms in the next 5 to 7 days, please follow-up with your primary care provider or return here to urgent care for repeat evaluation and further recommendations.   Thank you for visiting urgent care today.  We appreciate the opportunity to participate in your care.       This office note has been dictated using Teaching laboratory technician.  Unfortunately, this method of dictation can sometimes lead to typographical or grammatical errors.  I apologize for your inconvenience in advance if this occurs.  Please do not hesitate to reach out to me if clarification is needed.      Theadora Rama Scales, [place] 09/11/22 1931

## 2022-09-11 NOTE — ED Triage Notes (Signed)
Pt c/o cough (today), sinus pressure (Saturday) and sore throat.   Home interventions: mucinex

## 2022-09-12 ENCOUNTER — Telehealth: Payer: Self-pay

## 2022-09-12 DIAGNOSIS — U071 COVID-19: Secondary | ICD-10-CM

## 2022-09-12 MED ORDER — PAXLOVID (150/100) 10 X 150 MG & 10 X 100MG PO TBPK: 3.0000 | ORAL_TABLET | Freq: Two times a day (BID) | ORAL | 0 refills | Status: AC

## 2022-09-12 MED ORDER — PAXLOVID (150/100) 10 X 150 MG & 10 X 100MG PO TBPK: 3.0000 | ORAL_TABLET | Freq: Two times a day (BID) | ORAL | 0 refills | Status: DC

## 2022-09-12 NOTE — Telephone Encounter (Signed)
Positive covid 19 test.

## 2022-09-12 NOTE — Telephone Encounter (Signed)
Patient called stating her Covid test was positive and is requesting paxlovid. Provider made aware.

## 2022-11-27 ENCOUNTER — Encounter: Payer: Self-pay | Admitting: Nurse Practitioner

## 2022-11-27 ENCOUNTER — Ambulatory Visit: Payer: 59 | Admitting: Nurse Practitioner

## 2022-11-27 VITALS — BP 138/88 | HR 58 | Temp 98.7°F | Ht 67.0 in | Wt 305.0 lb

## 2022-11-27 DIAGNOSIS — H00012 Hordeolum externum right lower eyelid: Secondary | ICD-10-CM

## 2022-11-27 DIAGNOSIS — E559 Vitamin D deficiency, unspecified: Secondary | ICD-10-CM

## 2022-11-27 DIAGNOSIS — Z8616 Personal history of COVID-19: Secondary | ICD-10-CM

## 2022-11-27 DIAGNOSIS — Z8669 Personal history of other diseases of the nervous system and sense organs: Secondary | ICD-10-CM | POA: Insufficient documentation

## 2022-11-27 DIAGNOSIS — I10 Essential (primary) hypertension: Secondary | ICD-10-CM | POA: Diagnosis not present

## 2022-11-27 DIAGNOSIS — L2489 Irritant contact dermatitis due to other agents: Secondary | ICD-10-CM

## 2022-11-27 DIAGNOSIS — L678 Other hair color and hair shaft abnormalities: Secondary | ICD-10-CM

## 2022-11-27 DIAGNOSIS — Z6841 Body Mass Index (BMI) 40.0 and over, adult: Secondary | ICD-10-CM

## 2022-11-27 DIAGNOSIS — Q159 Congenital malformation of eye, unspecified: Secondary | ICD-10-CM

## 2022-11-27 DIAGNOSIS — Z13220 Encounter for screening for lipoid disorders: Secondary | ICD-10-CM

## 2022-11-27 DIAGNOSIS — R21 Rash and other nonspecific skin eruption: Secondary | ICD-10-CM

## 2022-11-27 DIAGNOSIS — Z79899 Other long term (current) drug therapy: Secondary | ICD-10-CM

## 2022-11-27 MED ORDER — MOMETASONE FUROATE 0.1 % EX CREA: 1.0000 | TOPICAL_CREAM | Freq: Every day | CUTANEOUS | 0 refills | Status: AC

## 2022-11-27 NOTE — Progress Notes (Signed)
I,Sheena H Holbrook,acting as a Education administrator for Minette Brine, FNP.,have documented all relevant documentation on the behalf of Minette Brine, FNP,as directed by  Minette Brine, FNP while in the presence of Minette Brine, [place].   Subjective:     Patient ID: Shelia Mcgee , female    DOB: 1976-06-20 , 47 y.o.   MRN: [id number]   Chief Complaint  Patient presents with   Establish Care    HPI  Patient presents today to establish care. She was seeing Debbrah Alar at [facility] with her last visit in December. She was referred by a friend who is a patient. She is single with no children. She works in Aeronautical engineer.   She has had hypertension for at least 20 years - she has been on benazapril/amlodipine combination. She has a family history of strokes thought to be related to heart issues. She tries to eat a low salt diet. She had lost quite a bit of weight was at 317 in 2021 and was as low as 270 lbs. She has been craving sweets better. She is taking a vitamin d supplement and her last vitamin d was normal. She also has a history of migraines - thought to be stress induced migraines.   Possible stye under right eye, patient states area has been present for > 2 months; denies pain/discharge/swelling/redness. She has been using an organic stye removal ointment that improved but did not go away.   Patient also has rash on the left side of her neck, itches, comes and goes; denies history of eczema/psoriasis. She has been using eczema lotion which helps it to dry out then will appear on other side. Does have seasonal allergies.       Past Medical History:  Diagnosis Date   Allergic rhinitis 03/10/2015   Arthritis 11/08/2015   HTN (hypertension) 04/29/2014   Hypertension    Vaginitis 04/29/2014   Vitamin D deficiency 05/31/2015     Family History  Problem Relation Age of Onset   Hypertension Mother        died in MVA in her 76's   Hypertension Father        had ESRD, die due to  infection from his graft   Diabetes Father    Stroke Father    Heart attack Maternal Uncle      Current Outpatient Medications:    amLODipine (NORVASC) 10 MG tablet, Take 1 tablet (10 mg total) by mouth daily., Disp: 90 tablet, Rfl: 1   Cholecalciferol (VITAMIN D3) 3000 UNITS TABS, Take 1 tablet by mouth daily., Disp: 30 tablet, Rfl:    fluticasone (FLONASE) 50 MCG/ACT nasal spray, Place 1 spray into both nostrils daily. Begin by using 2 sprays in each nare daily for 3 to 5 days, then decrease to 1 spray in each nare daily., Disp: 15.8 mL, Rfl: 2   levocetirizine (XYZAL) 5 MG tablet, Take 5 mg by mouth every evening., Disp: , Rfl:    metoprolol succinate (TOPROL-XL) 50 MG 24 hr tablet, TAKE 1 TABLET BY MOUTH ONCE DAILY. TAKE WITH OR IMMEDIATELY FOLLOWING A MEAL, Disp: 90 tablet, Rfl: 1   mometasone (ELOCON) 0.1 % cream, Apply 1 Application topically daily., Disp: 45 g, Rfl: 0   montelukast (SINGULAIR) 10 MG tablet, Take 10 mg by mouth daily., Disp: , Rfl:    Multiple Vitamins-Minerals (MULTIVITAMIN WITH MINERALS) tablet, Take 1 tablet by mouth daily., Disp: , Rfl:    rizatriptan (MAXALT) 10 MG tablet, rizatriptan 10 mg tablet, Disp: ,  Rfl:    topiramate (TOPAMAX) 100 MG tablet, Take 100 mg by mouth daily., Disp: , Rfl:    No Known Allergies   Review of Systems  Constitutional: Negative.  Negative for fatigue.  Respiratory: Negative.  Negative for wheezing.   Cardiovascular: Negative.  Negative for chest pain and leg swelling.  Musculoskeletal: Negative.   Skin:  Positive for rash.     Today's Vitals   11/27/22 0849  BP: 138/88  Pulse: (!) 58  Temp: 98.7 F (37.1 C)  TempSrc: Oral  SpO2: 93%  Weight: (!) 305 lb (138.3 kg)  Height: 5\' 7"  (1.702 m)   Body mass index is 47.77 kg/m.   Objective:  Physical Exam Vitals reviewed.  Constitutional:      General: [name].    Appearance: Normal appearance. She is well-developed. She is obese.  HENT:      Head: Normocephalic and atraumatic.  Eyes:     Pupils: Pupils are equal, round, and reactive to light.  Cardiovascular:     Rate and Rhythm: Normal rate and regular rhythm.     Pulses: Normal pulses.     Heart sounds: Normal heart sounds. No murmur heard. Pulmonary:     Effort: Pulmonary effort is normal. No respiratory distress.     Breath sounds: Normal breath sounds. No wheezing.  Skin:    General: Skin is warm and dry.     Capillary Refill: Capillary refill takes less than 2 seconds.     Findings: Rash (hyperpigmented skin to bilateral sides of neck on where her earrings are dangling) present.  Neurological:     General: No focal deficit present.     Mental Status: [name] and oriented to person, place, and time.     Cranial Nerves: No cranial nerve deficit.     Motor: No weakness.  Psychiatric:        Mood and Affect: Mood normal.        Behavior: Behavior normal.        Thought Content: Thought content normal.        Judgment: Judgment normal.         Assessment And Plan:     1. Essential hypertension Comments: Repeat BP - , continue current medications, I do not want to increase metoprolol due to HR already low. - BMP8+eGFR  2. Vitamin D deficiency Will check vitamin D level and supplement as needed.    Also encouraged to spend 15 minutes in the sun daily.   3. Abnormal facial hair - Insulin, random  4. Eye abnormality Comments: Has a papule to right lower eyelid, she is to use a warm compress and will refer to opthalmology - Ambulatory referral to Ophthalmology  5. Rash and nonspecific skin eruption Comments: Has a firm raised area to bridge of nose.  6. Irritant contact dermatitis due to other agents Comments: Will treat wtih mometasone. - mometasone (ELOCON) 0.1 % cream; Apply 1 Application topically daily.  Dispense: 45 g; Refill: 0  7. Morbid obesity ([place]) She is encouraged to strive for BMI less than 30 to decrease cardiac risk. Advised to aim  for at least 150 minutes of exercise per week.  8. BMI 45.0-49.9, adult (HCC) - Hemoglobin A1c  9. History of COVID-19 Comments: She was treated with Paxlovid in 09/2022  10. History of migraine  11. Encounter for screening for lipid disorder - Lipid panel  12. Other long term (current) drug therapy - TSH  Patient was given opportunity to ask questions. Patient verbalized understanding of the plan and was able to repeat key elements of the plan. All questions were answered to their satisfaction.   Minette Brine, FNP   I, Minette Brine, FNP, have reviewed all documentation for this visit. The documentation on 11/27/22 for the exam, diagnosis, procedures, and orders are all accurate and complete.   THE PATIENT IS ENCOURAGED TO PRACTICE SOCIAL DISTANCING DUE TO THE COVID-19 PANDEMIC.

## 2022-11-27 NOTE — Patient Instructions (Signed)
Check blood pressure daily for the next 7 days and send readings in my chart before I make any changes to your medications.  Goal to exercise 150 minutes per week with at least 2 days of strength training. Encouraged to park further when at the store, take stairs instead of elevators and to walk in place during commercials.Increase water intake to at least one gallon of water daily.    Nice to meet you!

## 2022-11-28 ENCOUNTER — Encounter: Payer: Self-pay | Admitting: Nurse Practitioner

## 2022-11-28 LAB — BMP8+EGFR
BUN/Creatinine Ratio: 14 (ref 9–23)
BUN: 13 mg/dL (ref 6–24)
CO2: 17 mmol/L — ABNORMAL LOW (ref 20–29)
Calcium: 9.6 mg/dL (ref 8.7–10.2)
Chloride: 106 mmol/L (ref 96–106)
Creatinine, Ser: 0.96 mg/dL (ref 0.57–1.00)
Glucose: 65 mg/dL — ABNORMAL LOW (ref 70–99)
Potassium: 4.4 mmol/L (ref 3.5–5.2)
Sodium: 143 mmol/L (ref 134–144)
eGFR: 75 mL/min/{1.73_m2} (ref >59)

## 2022-11-28 LAB — HEMOGLOBIN A1C
Est. average glucose Bld gHb Est-mCnc: 105 mg/dL
Hgb A1c MFr Bld: 5.3 % (ref 4.8–5.6)

## 2022-11-28 LAB — LIPID PANEL
Chol/HDL Ratio: 2.7 ratio (ref 0.0–4.4)
Cholesterol, Total: 169 mg/dL (ref 100–199)
HDL: 63 mg/dL (ref >39)
LDL Chol Calc (NIH): 93 mg/dL (ref 0–99)
Triglycerides: 66 mg/dL (ref 0–149)
VLDL Cholesterol Cal: 13 mg/dL (ref 5–40)

## 2022-11-28 LAB — TSH: TSH: 1 u[IU]/mL (ref 0.450–4.500)

## 2022-11-28 LAB — INSULIN, RANDOM: INSULIN: 19.1 u[IU]/mL (ref 2.6–24.9)

## 2023-01-23 ENCOUNTER — Other Ambulatory Visit: Payer: Self-pay

## 2023-01-23 MED ORDER — TOPIRAMATE 100 MG PO TABS: 100.0000 mg | ORAL_TABLET | Freq: Every day | ORAL | 2 refills | Status: DC

## 2023-02-06 ENCOUNTER — Encounter: Payer: BC Managed Care – PPO | Admitting: Family

## 2023-02-26 ENCOUNTER — Ambulatory Visit: Payer: 59 | Admitting: Nurse Practitioner

## 2023-03-01 ENCOUNTER — Ambulatory Visit: Payer: Self-pay | Admitting: Nurse Practitioner

## 2023-03-07 ENCOUNTER — Ambulatory Visit: Payer: 59 | Admitting: Nurse Practitioner

## 2023-03-07 ENCOUNTER — Encounter: Payer: Self-pay | Admitting: Nurse Practitioner

## 2023-03-07 VITALS — BP 138/80 | HR 74 | Temp 98.4°F | Ht 67.0 in | Wt 309.6 lb

## 2023-03-07 DIAGNOSIS — E559 Vitamin D deficiency, unspecified: Secondary | ICD-10-CM

## 2023-03-07 DIAGNOSIS — M25512 Pain in left shoulder: Secondary | ICD-10-CM

## 2023-03-07 DIAGNOSIS — I10 Essential (primary) hypertension: Secondary | ICD-10-CM | POA: Diagnosis not present

## 2023-03-07 DIAGNOSIS — Z6841 Body Mass Index (BMI) 40.0 and over, adult: Secondary | ICD-10-CM

## 2023-03-07 DIAGNOSIS — Z23 Encounter for immunization: Secondary | ICD-10-CM

## 2023-03-07 DIAGNOSIS — Z8669 Personal history of other diseases of the nervous system and sense organs: Secondary | ICD-10-CM

## 2023-03-07 DIAGNOSIS — G8929 Other chronic pain: Secondary | ICD-10-CM

## 2023-03-07 DIAGNOSIS — M25511 Pain in right shoulder: Secondary | ICD-10-CM | POA: Diagnosis not present

## 2023-03-07 MED ORDER — METOPROLOL SUCCINATE ER 50 MG PO TB24: ORAL_TABLET | ORAL | 1 refills | Status: DC

## 2023-03-07 MED ORDER — PREDNISONE 10 MG (21) PO TBPK: ORAL_TABLET | ORAL | 0 refills | Status: DC

## 2023-03-07 NOTE — Progress Notes (Signed)
Madelaine Bhat, CMA,acting as a Neurosurgeon for Arnette Felts, FNP.,have documented all relevant documentation on the behalf of Arnette Felts, FNP,as directed by  Arnette Felts, FNP while in the presence of Arnette Felts, FNP.  Subjective:  Patient ID: Shelia Mcgee , female    DOB: [birth year] , 47 y.o.   MRN: [id number]  Chief Complaint  Patient presents with   Hypertension    HPI  Patient presents today for a BP check, Patient reports compliance with medications. Patient denies any chest pain, SOB, or headaches.   Patient reports having pain in her shoulders when extending her arms up and when laying in bed at night, patient also reports she has forearm pain as well. Patient reports having the pain for at least one year, noticing is worse in the last few months beginning to wake her up in the middle of the night. She has taken tylenol arthritis for her hip which has been helpful. Biofreeze is effective as well. Does not limit her from doing any activity. Denies any falls. Shoulder pain is affecting her sleep.   Patient also reports she has margines as well often before her cycle. She was seeing a neurologist in the past in [place]. Her migraines are normally triggered by stressed.   BP Readings from Last 3 Encounters: 03/07/23 : 138/80 11/27/22 : 138/88 09/11/22 : (!) 134/90       Past Medical History:  Diagnosis Date   Allergic rhinitis 03/10/2015   Arthritis 11/08/2015   HTN (hypertension) 04/29/2014   Hypertension    Vaginitis 04/29/2014   Vitamin D deficiency 05/31/2015     Family History  Problem Relation Age of Onset   Hypertension Mother        died in MVA in her 55's   Hypertension Father        had ESRD, die due to infection from his graft   Diabetes Father    Stroke Father    Heart attack Maternal Uncle      Current Outpatient Medications:    amLODipine (NORVASC) 10 MG tablet, Take 1 tablet (10 mg total) by mouth daily., Disp: 90 tablet, Rfl: 1   Cholecalciferol (VITAMIN D3)  3000 UNITS TABS, Take 1 tablet by mouth daily., Disp: 30 tablet, Rfl:    fluticasone (FLONASE) 50 MCG/ACT nasal spray, Place 1 spray into both nostrils daily. Begin by using 2 sprays in each nare daily for 3 to 5 days, then decrease to 1 spray in each nare daily., Disp: 15.8 mL, Rfl: 2   levocetirizine (XYZAL) 5 MG tablet, Take 5 mg by mouth every evening., Disp: , Rfl:    mometasone (ELOCON) 0.1 % cream, Apply 1 Application topically daily., Disp: 45 g, Rfl: 0   montelukast (SINGULAIR) 10 MG tablet, Take 10 mg by mouth daily., Disp: , Rfl:    Multiple Vitamins-Minerals (MULTIVITAMIN WITH MINERALS) tablet, Take 1 tablet by mouth daily., Disp: , Rfl:    predniSONE (STERAPRED UNI-PAK 21 TAB) 10 MG (21) TBPK tablet, Take as directed, Disp: 21 tablet, Rfl: 0   rizatriptan (MAXALT) 10 MG tablet, rizatriptan 10 mg tablet, Disp: , Rfl:    topiramate (TOPAMAX) 100 MG tablet, Take 1 tablet (100 mg total) by mouth daily., Disp: 90 tablet, Rfl: 2   metoprolol succinate (TOPROL-XL) 50 MG 24 hr tablet, TAKE 1 TABLET BY MOUTH ONCE DAILY. TAKE WITH OR IMMEDIATELY FOLLOWING A MEAL, Disp: 90 tablet, Rfl: 1   No Known Allergies   Review of Systems  Constitutional: Negative.  Negative for fatigue.  Respiratory: Negative.  Negative for wheezing.   Cardiovascular: Negative.  Negative for chest pain, palpitations and leg swelling.  Musculoskeletal:  Positive for arthralgias.  Skin:  Negative for rash.  Neurological:  Positive for headaches (prior to menstrual cycle or stressed).     Today's Vitals   03/07/23 1513  BP: 138/80  Pulse: 74  Temp: 98.4 F (36.9 C)  TempSrc: Oral  Weight: (!) 309 lb 9.6 oz (140.4 kg)  Height: 5\' 7"  (1.702 m)  PainSc: 0-No pain   Body mass index is 48.49 kg/m.  Wt Readings from Last 3 Encounters:  03/07/23 (!) 309 lb 9.6 oz (140.4 kg)  11/27/22 (!) 305 lb (138.3 kg)  08/22/22 (!) 316 lb (143.3 kg)     Objective:  Physical Exam Vitals reviewed.  Constitutional:       General: [name].    Appearance: Normal appearance. She is well-developed. She is obese.  HENT:     Head: Normocephalic and atraumatic.  Eyes:     Pupils: Pupils are equal, round, and reactive to light.  Cardiovascular:     Rate and Rhythm: Normal rate and regular rhythm.     Pulses: Normal pulses.     Heart sounds: Normal heart sounds. No murmur heard. Pulmonary:     Effort: Pulmonary effort is normal. No respiratory distress.     Breath sounds: Normal breath sounds. No wheezing.  Musculoskeletal:        General: Normal range of motion.  Skin:    General: Skin is warm and dry.     Capillary Refill: Capillary refill takes less than 2 seconds.     Findings: No rash.  Neurological:     General: No focal deficit present.     Mental Status: [name] and oriented to person, place, and time.     Cranial Nerves: No cranial nerve deficit.     Motor: No weakness.  Psychiatric:        Mood and Affect: Mood normal.        Behavior: Behavior normal.        Thought Content: Thought content normal.        Judgment: Judgment normal.         Assessment And Plan:  Essential hypertension Assessment & Plan: B/P is controlled.  CMP ordered to check renal function.  The importance of regular exercise and dietary modification was stressed to the patient.  Stressed importance of losing ten percent of her body weight to help with B/P control.  The weight loss would help with decreasing cardiac and cancer risk as well.     Orders: -     BMP8+eGFR -     Metoprolol Succinate ER; TAKE 1 TABLET BY MOUTH ONCE DAILY. TAKE WITH OR IMMEDIATELY FOLLOWING A MEAL  Dispense: 90 tablet; Refill: 1  History of migraine Assessment & Plan: Will refer to neurology as the current treatment is not been effective  Orders: -     Ambulatory referral to Neurology  COVID-19 vaccine administered Assessment & Plan: Covid 19 vaccine given in office observed for 15 minutes without any  adverse reaction   Orders: [employer] Fall 2023 Covid-19 Vaccine 57yrs and older  Vitamin D deficiency Assessment & Plan: Will check vitamin D level and supplement as needed.    Also encouraged to spend 15 minutes in the sun daily.     Morbid obesity with BMI of 45.0-49.9,  adult [facility]) Assessment & Plan: She is encouraged to strive for BMI less than 30 to decrease cardiac risk. Advised to aim for at least 150 minutes of exercise per week.    Chronic pain of both shoulders Assessment & Plan: Treat with steroid taper.  May be related to bursitis versus arthritis.  If not better return call to office.    Orders: -     predniSONE; Take as directed  Dispense: 21 tablet; Refill: 0    Return for 6 month bp check and HM.  Patient was given opportunity to ask questions. Patient verbalized understanding of the plan and was able to repeat key elements of the plan. All questions were answered to their satisfaction.    Jeanell Sparrow, FNP, have reviewed all documentation for this visit. The documentation on 03/07/23 for the exam, diagnosis, procedures, and orders are all accurate and complete.   IF YOU HAVE BEEN REFERRED TO A SPECIALIST, IT MAY TAKE 1-2 WEEKS TO SCHEDULE/PROCESS THE REFERRAL. IF YOU HAVE NOT HEARD FROM US/SPECIALIST IN TWO WEEKS, PLEASE GIVE [place] A CALL AT [phone].

## 2023-03-08 LAB — BMP8+EGFR
BUN/Creatinine Ratio: 10 (ref 9–23)
BUN: 9 mg/dL (ref 6–24)
CO2: 16 mmol/L — ABNORMAL LOW (ref 20–29)
Calcium: 9.3 mg/dL (ref 8.7–10.2)
Chloride: 102 mmol/L (ref 96–106)
Creatinine, Ser: 0.89 mg/dL (ref 0.57–1.00)
Glucose: 70 mg/dL (ref 70–99)
Potassium: 4.4 mmol/L (ref 3.5–5.2)
Sodium: 138 mmol/L (ref 134–144)
eGFR: 82 mL/min/{1.73_m2} (ref >59)

## 2023-03-21 DIAGNOSIS — G8929 Other chronic pain: Secondary | ICD-10-CM | POA: Insufficient documentation

## 2023-03-21 NOTE — Assessment & Plan Note (Signed)
B/P is controlled.  CMP ordered to check renal function.  The importance of regular exercise and dietary modification was stressed to the patient.  Stressed importance of losing ten percent of her body weight to help with B/P control.  The weight loss would help with decreasing cardiac and cancer risk as well.

## 2023-03-21 NOTE — Assessment & Plan Note (Addendum)
Treat with steroid taper.  May be related to bursitis versus arthritis.  If not better return call to office.

## 2023-03-21 NOTE — Assessment & Plan Note (Signed)
Covid 19 vaccine given in office observed for 15 minutes without any adverse reaction  

## 2023-03-21 NOTE — Assessment & Plan Note (Signed)
Will refer to neurology as the current treatment is not been effective

## 2023-03-21 NOTE — Assessment & Plan Note (Signed)
She is encouraged to strive for BMI less than 30 to decrease cardiac risk. Advised to aim for at least 150 minutes of exercise per week.  

## 2023-03-21 NOTE — Assessment & Plan Note (Signed)
Will check vitamin D level and supplement as needed.    Also encouraged to spend 15 minutes in the sun daily.   

## 2023-03-22 ENCOUNTER — Other Ambulatory Visit (HOSPITAL_BASED_OUTPATIENT_CLINIC_OR_DEPARTMENT_OTHER): Payer: Self-pay | Admitting: Nurse Practitioner

## 2023-03-22 DIAGNOSIS — Z1231 Encounter for screening mammogram for malignant neoplasm of breast: Secondary | ICD-10-CM

## 2023-03-26 ENCOUNTER — Ambulatory Visit (HOSPITAL_BASED_OUTPATIENT_CLINIC_OR_DEPARTMENT_OTHER)
Admission: RE | Admit: 2023-03-26 | Discharge: 2023-03-26 | Disposition: A | Discharge status: Home / Self Care | Payer: Self-pay | Source: Ambulatory Visit | Attending: Nurse Practitioner | Admitting: Nurse Practitioner

## 2023-03-26 ENCOUNTER — Encounter (HOSPITAL_BASED_OUTPATIENT_CLINIC_OR_DEPARTMENT_OTHER): Payer: Self-pay

## 2023-03-26 ENCOUNTER — Ambulatory Visit (HOSPITAL_BASED_OUTPATIENT_CLINIC_OR_DEPARTMENT_OTHER): Payer: BC Managed Care – PPO

## 2023-03-26 DIAGNOSIS — Z1231 Encounter for screening mammogram for malignant neoplasm of breast: Secondary | ICD-10-CM | POA: Insufficient documentation

## 2023-05-22 ENCOUNTER — Other Ambulatory Visit: Payer: Self-pay | Admitting: Family

## 2023-05-29 ENCOUNTER — Other Ambulatory Visit: Payer: Self-pay

## 2023-05-29 MED ORDER — AMLODIPINE BESYLATE 10 MG PO TABS: 10.0000 mg | ORAL_TABLET | Freq: Every day | ORAL | 2 refills | Status: DC

## 2023-06-21 ENCOUNTER — Ambulatory Visit: Payer: 59 | Admitting: Neurology

## 2023-06-21 ENCOUNTER — Encounter: Payer: Self-pay | Admitting: Neurology

## 2023-06-21 VITALS — BP 139/83 | HR 58 | Ht 66.0 in | Wt 319.0 lb

## 2023-06-21 DIAGNOSIS — G43009 Migraine without aura, not intractable, without status migrainosus: Secondary | ICD-10-CM

## 2023-06-21 MED ORDER — ONDANSETRON 4 MG PO TBDP
4.0000 mg | ORAL_TABLET | Freq: Three times a day (TID) | ORAL | 3 refills | Status: DC | PRN
Start: 2023-06-21 — End: 2023-12-05

## 2023-06-21 MED ORDER — UBRELVY 100 MG PO TABS
100.0000 mg | ORAL_TABLET | ORAL | Status: DC | PRN
Start: 2023-06-21 — End: 2024-07-08

## 2023-06-21 MED ORDER — QULIPTA 60 MG PO TABS: 60.0000 mg | ORAL_TABLET | Freq: Every day | ORAL | Status: DC

## 2023-06-21 NOTE — Patient Instructions (Addendum)
Prevention: Start Qulipta once daily Acute management; Ubrelvy : Please take one tablet at the onset of your headache. If it does not improve the symptoms please take one additional tablet in 2 hours Nausea: Ondansetron  Ubrogepant Tablets What is this medication? UBROGEPANT (ue BROE je pant) treats migraines. It works by blocking a substance in the body that causes migraines. It is not used to prevent migraines. This medicine may be used for other purposes; ask your health care provider or pharmacist if you have questions. COMMON BRAND NAME(S): Bernita Raisin What should I tell my care team before I take this medication? They need to know if you have any of these conditions: Kidney disease Liver disease An unusual or allergic reaction to ubrogepant, other medications, foods, dyes, or preservatives Pregnant or trying to get pregnant Breast-feeding How should I use this medication? Take this medication by mouth with a glass of water. Take it as directed on the prescription label. You can take it with or without food. If it upsets your stomach, take it with food. Keep taking it unless your care team tells you to stop. Talk to your care team about the use of this medication in children. Special care may be needed. Overdosage: If you think you have taken too much of this medicine contact a poison control center or emergency room at once. NOTE: This medicine is only for you. Do not share this medicine with others. What if I miss a dose? This does not apply. This medication is not for regular use. What may interact with this medication? Do not take this medication with any of the following: Adagrasib Ceritinib Certain antibiotics, such as chloramphenicol, clarithromycin, telithromycin Certain antivirals for HIV, such as atazanavir, cobicistat, darunavir, delavirdine, fosamprenavir, indinavir, ritonavir Certain medications for fungal infections, such as itraconazole, ketoconazole, posaconazole,  voriconazole Conivaptan Grapefruit Idelalisib Mifepristone Nefazodone Ribociclib This medication may also interact with the following: Carvedilol Certain medications for seizures, such as phenobarbital, phenytoin Ciprofloxacin Cyclosporine Eltrombopag Fluconazole Fluvoxamine Quinidine Rifampin St. John's wort Verapamil This list may not describe all possible interactions. Give your health care provider a list of all the medicines, herbs, non-prescription drugs, or dietary supplements you use. Also tell them if you smoke, drink alcohol, or use illegal drugs. Some items may interact with your medicine. What should I watch for while using this medication? Visit your care team for regular checks on your progress. Tell your care team if your symptoms do not start to get better or if they get worse. Your mouth may get dry. Chewing sugarless gum or sucking hard candy and drinking plenty of water may help. Contact your care team if the problem does not go away or is severe. What side effects may I notice from receiving this medication? Side effects that you should report to your care team as soon as possible: Allergic reactions--skin rash, itching, hives, swelling of the face, lips, tongue, or throat Side effects that usually do not require medical attention (report to your care team if they continue or are bothersome): Drowsiness Dry mouth Fatigue Nausea This list may not describe all possible side effects. Call your doctor for medical advice about side effects. You may report side effects to FDA at 1-800-FDA-1088. Where should I keep my medication? Keep out of the reach of children and pets. Store between 15 and 30 degrees C (59 and 86 degrees F). Get rid of any unused medication after the expiration date. To get rid of medications that are no longer needed or have  expired: Take the medication to a medication take-back program. Check with your pharmacy or law enforcement to find a  location. If you cannot return the medication, check the label or package insert to see if the medication should be thrown out in the garbage or flushed down the toilet. If you are not sure, ask your care team. If it is safe to put it in the trash, pour the medication out of the container. Mix the medication with cat litter, dirt, coffee grounds, or other unwanted substance. Seal the mixture in a bag or container. Put it in the trash. NOTE: This sheet is a summary. It may not cover all possible information. If you have questions about this medicine, talk to your doctor, pharmacist, or health care provider.  2024 Elsevier/Gold Standard (2021-10-14 00:00:00) Atogepant Tablets What is this medication? ATOGEPANT (a TOE je pant) prevents migraines. It works by blocking a substance in the body that causes migraines. This medicine may be used for other purposes; ask your health care provider or pharmacist if you have questions. COMMON BRAND NAME(S): QULIPTA What should I tell my care team before I take this medication? They need to know if you have any of these conditions: Kidney disease Liver disease An unusual or allergic reaction to atogepant, other medications, foods, dyes, or preservatives Pregnant or trying to get pregnant Breast-feeding How should I use this medication? Take this medication by mouth with water. Take it as directed on the prescription label at the same time every day. You can take it with or without food. If it upsets your stomach, take it with food. Keep taking it unless your care team tells you to stop. Talk to your care team about the use of this medication in children. Special care may be needed. Overdosage: If you think you have taken too much of this medicine contact a poison control center or emergency room at once. NOTE: This medicine is only for you. Do not share this medicine with others. What if I miss a dose? If you miss a dose, take it as soon as you can. If it is  almost time for your next dose, take only that dose. Do not take double or extra doses. What may interact with this medication? Carbamazepine Certain medications for fungal infections, such as itraconazole, ketoconazole Clarithromycin Cyclosporine Efavirenz Etravirine Phenytoin Rifampin St. John's wort This list may not describe all possible interactions. Give your health care provider a list of all the medicines, herbs, non-prescription drugs, or dietary supplements you use. Also tell them if you smoke, drink alcohol, or use illegal drugs. Some items may interact with your medicine. What should I watch for while using this medication? Visit your care team for regular checks on your progress. Tell your care team if your symptoms do not start to get better or if they get worse. What side effects may I notice from receiving this medication? Side effects that you should report to your care team as soon as possible: Allergic reactions--skin rash, itching, hives, swelling of the face, lips, tongue, or throat Side effects that usually do not require medical attention (report to your care team if they continue or are bothersome): Constipation Fatigue Loss of appetite with weight loss Nausea This list may not describe all possible side effects. Call your doctor for medical advice about side effects. You may report side effects to FDA at 1-800-FDA-1088. Where should I keep my medication? Keep out of the reach of children and pets. Store at room temperature  between 20 and 25 degrees C (68 and 77 degrees F). Get rid of any unused medication after the expiration date. To get rid of medications that are no longer needed or have expired: Take the medication to a medication take-back program. Check with your pharmacy or law enforcement to find a location. If you cannot return the medication, check the label or package insert to see if the medication should be thrown out in the garbage or flushed down  the toilet. If you are not sure, ask your care team. If it is safe to put it in the trash, take the medication out of the container. Mix the medication with cat litter, dirt, coffee grounds, or other unwanted substance. Seal the mixture in a bag or container. Put it in the trash. NOTE: This sheet is a summary. It may not cover all possible information. If you have questions about this medicine, talk to your doctor, pharmacist, or health care provider.  2024 Elsevier/Gold Standard (2021-10-10 00:00:00)

## 2023-06-21 NOTE — Progress Notes (Signed)
[facility]    Provider:  Dr Lucia Gaskins Requesting Provider: Arnette Felts, FNP Primary Care Provider:  Arnette Felts, FNP  CC:  Migraines  HPI:  Shelia Mcgee is a 47 y.o. female here as requested by Arnette Felts, FNP for migraines. has Essential hypertension; Routine general medical examination at a health care facility; Contraceptive management; Allergic rhinitis; Vitamin D deficiency; Arthritis; Morbid obesity (HCC); Migraine; Pain in joint of right hip; Migraine without aura and without status migrainosus, not intractable; BMI 45.0-49.9, adult (HCC); History of migraine; COVID-19 vaccine administered; and Chronic pain of both shoulders on their problem list.  Migraines started 4-5 years ago and mainly from stress. Throbbing, nausea, osmophobia, phonophobia (has to put in ear bud to cut out the sound) hurts to move, starts on the left, unilateral, behind the eyes, she had a sleep study, no brain imaging, no vision changes, not positional or exertional. No morning headaches but can wake up with some pressure in the head, often. No known food, wine, sleep triggers or other inciting events or triggers. Topiramate helps. Can last 2-3 days. She has daily headaches. She has morning headaches and she wakes up with the headaches. They improve with medication. No other focal neurologic deficits, associated symptoms, inciting events or modifiable factors.    Reviewed notes, labs and imaging from outside physicians, which showed:     Latest Ref Rng & Units 03/07/2023    3:58 PM 11/27/2022    9:57 AM 08/22/2022    8:02 AM  CMP  Glucose 70 - 99 mg/dL 70  65  79   BUN 6 - 24 mg/dL 9  13  10    Creatinine 0.57 - 1.00 mg/dL 5.40  9.81  1.91   Sodium 134 - 144 mmol/L 138  143  138   Potassium 3.5 - 5.2 mmol/L 4.4  4.4  3.8   Chloride 96 - 106 mmol/L 102  106  102   CO2 20 - 29 mmol/L 16  17  27    Calcium 8.7 - 10.2 mg/dL 9.3  9.6  9.0       Latest Ref Rng & Units 03/22/2021    6:17  PM 12/22/2019   10:09 AM 09/03/2018    9:04 AM  CBC  WBC 4.0 - 10.5 K/uL 6.5  6.3  7.0   Hemoglobin 12.0 - 15.0 g/dL 47.8  29.5  62.1   Hematocrit 36.0 - 46.0 % 39.6  38.7  40.7   Platelets 150 - 400 K/uL 334  351.0  351.0      Medications tried > 3 months that can be used in migraines: Amlodipine, metoprolol, topamax, aimovig, amitriptyline, gabapentin and below (from a thorough review of records)  ANALGESICS: Tylenol ANTI-MIGRAINE: Maxalt HEART/BP: Metoprolol, amlodipine DECONGESTANT/ANTIHISTAMINE: ANTI-NAUSEANT NSAIDS: diclofenac, Advil MUSCLE RELAXANTS: Flexeril, Tizanidine ANTI-CONVULSANTS: Gabapentin, Topamax STEROIDS: SLEEPING PILLS/TRANQUILIZERS: ANTI-DEPRESSANTS: HERBAL:  FIBROMYALGIA:  HORMONAL: OTHER:  PROCEDURES FOR HEADACHES:   Reviewed Liborio Nixon Moore's npotes: Patient reports having pain in her shoulders when extending her arms up and when laying in bed at night, patient also reports she has forearm pain as well. Patient reports having the pain for at least one year, noticing is worse in the last few months beginning to wake her up in the middle of the night. She has taken tylenol arthritis for her hip which has been helpful. Biofreeze is effective as well. Does not limit her from doing any activity. Denies any falls. Shoulder pain is affecting her sleep.    Patient also reports  she has margines as well often before her cycle. She was seeing a neurologist in the past in [place]. Her migraines are normally triggered by stressed.      Latest Ref Rng & Units 03/07/2023    3:58 PM 11/27/2022    9:57 AM 08/22/2022    8:02 AM  BMP  Glucose 70 - 99 mg/dL 70  65  79   BUN 6 - 24 mg/dL 9  13  10    Creatinine 0.57 - 1.00 mg/dL 4.09  8.11  9.14   BUN/Creat Ratio 9 - 23 10  14     Sodium 134 - 144 mmol/L 138  143  138   Potassium 3.5 - 5.2 mmol/L 4.4  4.4  3.8   Chloride 96 - 106 mmol/L 102  106  102   CO2 20 - 29 mmol/L 16  17  27    Calcium 8.7 - 10.2 mg/dL 9.3  9.6  9.0        Latest Ref Rng & Units 03/22/2021    6:17 PM 12/22/2019   10:09 AM 09/03/2018    9:04 AM  CBC  WBC 4.0 - 10.5 K/uL 6.5  6.3  7.0   Hemoglobin 12.0 - 15.0 g/dL 78.2  95.6  21.3   Hematocrit 36.0 - 46.0 % 39.6  38.7  40.7   Platelets 150 - 400 K/uL 334  351.0  351.0    11/27/2022: tsh 1 nml, hgba1c 5.3, vit d 51.6   Review of Systems: Patient complains of symptoms per HPI as well as the following symptoms none. Pertinent negatives and positives per HPI. All others negative.  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 [employer]   Enjoys bowling, spending time with friends, church   Social Determinants of Health   Financial Resource Strain: Low Risk  (03/06/2023)   Overall Financial Resource Strain (CARDIA)    Difficulty of Paying Living Expenses: Not hard at all  Food Insecurity: No Food Insecurity (03/06/2023)   Hunger Vital Sign    Worried About Running Out of Food in the Last Year: Never true    Ran Out of Food in the Last Year: Never true  Transportation Needs: No Transportation Needs (03/06/2023)   PRAPARE - Administrator, Civil Service (Medical): No    Lack of Transportation (Non-Medical): No  Physical Activity: Unknown (03/06/2023)   Exercise Vital Sign    Days of Exercise per Week: Patient declined    Minutes of Exercise per Session: Not on file  Stress: No Stress Concern  Present (03/06/2023)   [employer] of Occupational Health - Occupational Stress Questionnaire    Feeling of Stress : Not at all  Social Connections: Moderately Integrated (03/06/2023)   Social Connection and Isolation Panel [NHANES]    Frequency of  Communication with Friends and Family: More than three times a week    Frequency of Social Gatherings with Friends and Family: Once a week    Attends Religious Services: More than 4 times per year    Active Member of Clubs or Organizations: Yes    Attends Banker Meetings: More than 4 times per year    Marital Status: Never married  Intimate Partner Violence: Unknown (12/08/2021)   Received from [employer], [facility]   HITS    Physically Hurt: Not on file    Insult or Talk Down To: Not on file    Threaten Physical Harm: Not on file    Scream or Curse: Not on file    Family History  Problem Relation Age of Onset   Hypertension Mother        died in MVA in her 25's   Hypertension Father        had ESRD, die due to infection from his graft   Diabetes Father    Stroke Father    Heart attack Maternal Uncle    Migraines Neg Hx     Past Medical History:  Diagnosis Date   Allergic rhinitis 03/10/2015   Arthritis 11/08/2015   HTN (hypertension) 04/29/2014   Hypertension    Vaginitis 04/29/2014   Vitamin D deficiency 05/31/2015    Patient Active Problem List   Diagnosis Date Noted   Chronic pain of both shoulders 03/21/2023   COVID-19 vaccine administered 03/07/2023   History of migraine 11/27/2022   Migraine 08/22/2022   Morbid obesity (HCC) 06/20/2021   Pain in joint of right hip 04/09/2018   Migraine without aura and without status migrainosus, not intractable 03/26/2018   BMI 45.0-49.9, adult (HCC) 03/26/2018   Arthritis 11/08/2015   Vitamin D deficiency 05/31/2015   Allergic rhinitis 03/10/2015   Contraceptive management 01/22/2015   Routine general medical examination at a health care facility 05/22/2014    Essential hypertension 04/29/2014    Past Surgical History:  Procedure Laterality Date   HIP SURGERY Right 1992   States that bones were slipping. Stabilized it with 3 screws x 1 yr then had screws removed.    Current Outpatient Medications  Medication Sig Dispense Refill   amLODipine (NORVASC) 10 MG tablet Take 1 tablet (10 mg total) by mouth daily. 90 tablet 2   Atogepant (QULIPTA) 60 MG TABS Take 1 tablet (60 mg total) by mouth daily.     Atogepant (QULIPTA) 60 MG TABS Take 1 tablet (60 mg total) by mouth daily. Please use copay card: BIN [id number] PCN [facility] GRP [id number] ID [id number] 30 tablet 11   Cholecalciferol (VITAMIN D3) 3000 UNITS TABS Take 1 tablet by mouth daily. 30 tablet    fluticasone (FLONASE) 50 MCG/ACT nasal spray Place 1 spray into both nostrils daily. Begin by using 2 sprays in each nare daily for 3 to 5 days, then decrease to 1 spray in each nare daily. 15.8 mL 2   levocetirizine (XYZAL) 5 MG tablet Take 5 mg by mouth every evening.     metoprolol succinate (TOPROL-XL) 50 MG 24 hr tablet TAKE 1 TABLET BY MOUTH ONCE DAILY. TAKE WITH OR IMMEDIATELY FOLLOWING A MEAL 90 tablet 1   mometasone (ELOCON) 0.1 % cream Apply 1 Application topically daily. 45 g 0   montelukast (SINGULAIR) 10 MG tablet Take 10 mg by mouth daily.     Multiple Vitamins-Minerals (MULTIVITAMIN WITH MINERALS) tablet Take 1 tablet by mouth daily.  ondansetron (ZOFRAN-ODT) 4 MG disintegrating tablet Take 1-2 tablets (4-8 mg total) by mouth every 8 (eight) hours as needed. 30 tablet 3   rizatriptan (MAXALT) 10 MG tablet rizatriptan 10 mg tablet     tiZANidine (ZANAFLEX) 4 MG capsule tiZANidine HCl     topiramate (TOPAMAX) 100 MG tablet Take 1 tablet (100 mg total) by mouth daily. 90 tablet 2   Ubrogepant (UBRELVY) 100 MG TABS Take 1 tablet (100 mg total) by mouth every 2 (two) hours as needed. Maximum 200mg  a day.     Ubrogepant (UBRELVY) 100 MG TABS Take 1 tablet (100 mg total) by mouth every 2 (two)  hours as needed. Maximum 200mg  a day.Please use copay card: [facility] PCN 54 Grp [id number] ID [id number] 16 tablet 11   No current facility-administered medications for this visit.    Allergies as of 06/21/2023   (No Known Allergies)    Vitals: BP 139/83   Pulse (!) 58   Ht 5\' 6"  (1.676 m)   Wt (!) 319 lb (144.7 kg)   BMI 51.49 kg/m  Last Weight:  Wt Readings from Last 1 Encounters:  06/21/23 (!) 319 lb (144.7 kg)   Last Height:   Ht Readings from Last 1 Encounters:  06/21/23 5\' 6"  (1.676 m)     Physical exam: Exam: Gen: NAD, conversant, well nourised, obese, well groomed                     CV: RRR, no MRG. No Carotid Bruits. No peripheral edema, warm, nontender Eyes: Conjunctivae clear without exudates or hemorrhage  Neuro: Detailed Neurologic Exam  Speech:    Speech is normal; fluent and spontaneous with normal comprehension.  Cognition:    The patient is oriented to person, place, and time;     recent and remote memory intact;     language fluent;     normal attention, concentration,     fund of knowledge Cranial Nerves:    The pupils are equal, round, and reactive to light. The fundi are normal and spontaneous venous pulsations are present. Visual fields are full to finger confrontation. Extraocular movements are intact. Trigeminal sensation is intact and the muscles of mastication are normal. The face is symmetric. The palate elevates in the midline. Hearing intact. Voice is normal. Shoulder shrug is normal. The tongue has normal motion without fasciculations.   Coordination:    Normal finger to nose and heel to shin. Normal rapid alternating movements.   Gait:    Heel-toe and tandem gait are normal.   Motor Observation:    No asymmetry, no atrophy, and no involuntary movements noted. Tone:    Normal muscle tone.    Posture:    Posture is normal. normal erect    Strength:    Strength is V/V in the upper and lower limbs.      Sensation: intact to  LT     Reflex Exam:  DTR's:    Deep tendon reflexes in the upper and lower extremities are normal bilaterally.   Toes:    The toes are downgoing bilaterally.   Clonus:    Clonus is absent.    Assessment/Plan:  Patient with migraines  Prevention: Start Qulipta once daily Acute management; Ubrelvy : Please take one tablet at the onset of your headache. If it does not improve the symptoms please take one additional tablet in 2 hours Nausea: Ondansetron With her permission, together signed up for copay cards online and included in  prescription  No orders of the defined types were placed in this encounter.  Meds ordered this encounter  Medications   Atogepant (QULIPTA) 60 MG TABS    Sig: Take 1 tablet (60 mg total) by mouth daily.   Ubrogepant (UBRELVY) 100 MG TABS    Sig: Take 1 tablet (100 mg total) by mouth every 2 (two) hours as needed. Maximum 200mg  a day.   ondansetron (ZOFRAN-ODT) 4 MG disintegrating tablet    Sig: Take 1-2 tablets (4-8 mg total) by mouth every 8 (eight) hours as needed.    Dispense:  30 tablet    Refill:  3   Atogepant (QULIPTA) 60 MG TABS    Sig: Take 1 tablet (60 mg total) by mouth daily. Please use copay card: BIN [id number] PCN [facility] GRP [id number] ID [id number]    Dispense:  30 tablet    Refill:  11    BIN [id number] PCN [facility] GRP [id number] ID [id number]   Ubrogepant (UBRELVY) 100 MG TABS    Sig: Take 1 tablet (100 mg total) by mouth every 2 (two) hours as needed. Maximum 200mg  a day.Please use copay card: [facility] PCN 54 Grp [id number] ID [id number]    Dispense:  16 tablet    Refill:  11    Please use copay card: [facility] PCN 54 Grp [id number] ID [id number]    Cc: Arnette Felts, FNP,  Arnette Felts, FNP  Naomie Dean, MD  [facility] [address] [place], [place] [address]  Phone [phone] Fax [phone]

## 2023-06-24 MED ORDER — QULIPTA 60 MG PO TABS: 60.0000 mg | ORAL_TABLET | Freq: Every day | ORAL | 11 refills | Status: DC

## 2023-06-24 MED ORDER — UBRELVY 100 MG PO TABS: 100.0000 mg | ORAL_TABLET | ORAL | 11 refills | Status: DC | PRN

## 2023-07-12 ENCOUNTER — Telehealth: Payer: Self-pay | Admitting: Neurology

## 2023-07-12 NOTE — Telephone Encounter (Signed)
Pt said,insurance requesting a authorization  Atogepant (QULIPTA) 60 MG TABS  and Ubrogepant (UBRELVY) 100 MG TABS before filingl prescriptions.

## 2023-07-13 ENCOUNTER — Telehealth: Payer: Self-pay

## 2023-07-13 ENCOUNTER — Other Ambulatory Visit (HOSPITAL_COMMUNITY): Payer: Self-pay

## 2023-07-13 NOTE — Telephone Encounter (Signed)
PA request has been Submitted. New Encounter created for follow up. For additional info see Pharmacy Prior Auth telephone encounter from 07/13/2023.

## 2023-07-13 NOTE — Telephone Encounter (Signed)
Pharmacy Patient Advocate Encounter   Received notification from Physician's Office that prior authorization for Ubrelvy 100MG  tablets is required/requested.   Insurance verification completed.   The patient is insured through [facility] .   Per test claim: PA required; PA submitted to above mentioned insurance via CoverMyMeds Key/confirmation #/EOC [facility] Status is pending

## 2023-07-13 NOTE — Telephone Encounter (Signed)
Pharmacy Patient Advocate Encounter   Received notification from Physician's Office that prior authorization for Qulipta 60MG  tablets is required/requested.   Insurance verification completed.   The patient is insured through [facility] .   Per test claim: PA required; PA submitted to above mentioned insurance via CoverMyMeds Key/confirmation #/EOC [id number] Status is pending

## 2023-07-17 ENCOUNTER — Encounter: Payer: Self-pay | Admitting: Neurology

## 2023-07-19 ENCOUNTER — Other Ambulatory Visit (HOSPITAL_COMMUNITY): Payer: Self-pay

## 2023-07-19 MED ORDER — ZOLMITRIPTAN 5 MG PO TABS: 5.0000 mg | ORAL_TABLET | ORAL | 0 refills | Status: DC | PRN

## 2023-07-19 NOTE — Telephone Encounter (Signed)
Pt notified about ubrevly denial pt has agreed to try another acute medication

## 2023-07-19 NOTE — Telephone Encounter (Signed)
I spoke with the patient about the Ubrelvy denial.  The patient is amenable to trying another triptan.  She has failed rizatriptan so far.  We agreed that I would communicate with her via MyChart once I have spoken with a work-in provider about a  new prescription.  I spoke with Dr. Pearlean Brownie who provided a verbal order for the zolmitriptan 5 mg x 1 month trial.  I sent this to the pt's pharmacy and notified her via mychart.

## 2023-07-19 NOTE — Telephone Encounter (Signed)
Pharmacy Patient Advocate Encounter  Received notification from [facility] that Prior Authorization for Qulipta 60MG  tablets has been APPROVED from 07/13/2023 to 07/12/2024. Ran test claim, Copay is $0. This test claim was processed through [facility]- copay amounts may vary at other pharmacies due to pharmacy/plan contracts, or as the patient moves through the different stages of their insurance plan.   PA #/Case ID/Reference #: PA Case ID #: [id number]

## 2023-07-19 NOTE — Telephone Encounter (Signed)
Pharmacy Patient Advocate Encounter  Received notification from [facility] that Prior Authorization for Ubrelvy 100MG  tablets has been DENIED.  Full denial letter will be uploaded to the media tab. See denial reason below.   PA #/Case ID/Reference #: PA Case ID #: [id number]

## 2023-07-26 ENCOUNTER — Telehealth: Payer: 59 | Admitting: Nurse Practitioner

## 2023-07-26 ENCOUNTER — Encounter: Payer: Self-pay | Admitting: Nurse Practitioner

## 2023-07-26 DIAGNOSIS — U071 COVID-19: Secondary | ICD-10-CM | POA: Diagnosis not present

## 2023-07-26 MED ORDER — NIRMATRELVIR/RITONAVIR (PAXLOVID)TABLET
3.0000 | ORAL_TABLET | Freq: Two times a day (BID) | ORAL | 0 refills | Status: AC
Start: 2023-07-26 — End: 2023-07-31

## 2023-07-26 NOTE — Progress Notes (Signed)
Virtual Visit via Video Note  Madelaine Bhat, CMA,acting as a scribe for Arnette Felts, FNP.,have documented all relevant documentation on the behalf of Arnette Felts, FNP,as directed by  Arnette Felts, FNP while in the presence of Arnette Felts, FNP.  I connected with Shelia Mcgee on 07/26/23 at  4:20 PM EST by a video enabled telemedicine application and verified that I am speaking with the correct person using two identifiers.  Patient Location: Home Provider Location: Office/Clinic  I discussed the limitations, risks, security, and privacy concerns of performing an evaluation and management service by video and the availability of in person appointments. I also discussed with the patient that there may be a patient responsible charge related to this service. The patient expressed understanding and agreed to proceed.  Subjective: PCP: Arnette Felts, FNP  Chief Complaint  Patient presents with   Covid Positive   Patient presents today for testing positive for Covid today, patient reports someone she was around tested positive Tuesday after being around them on Sunday. She reports her throat started hurting Tuesday. Patient currently has nasal congestion, coughing and chills.  Patient reports no fever. She has taken over the counter mucinex and vitamin c.     ROS: Per HPI  Current Outpatient Medications:    amLODipine (NORVASC) 10 MG tablet, Take 1 tablet (10 mg total) by mouth daily., Disp: 90 tablet, Rfl: 2   Atogepant (QULIPTA) 60 MG TABS, Take 1 tablet (60 mg total) by mouth daily., Disp: , Rfl:    Atogepant (QULIPTA) 60 MG TABS, Take 1 tablet (60 mg total) by mouth daily. Please use copay card: BIN [id number] PCN [facility] GRP [id number] ID [id number], Disp: 30 tablet, Rfl: 11   Cholecalciferol (VITAMIN D3) 3000 UNITS TABS, Take 1 tablet by mouth daily., Disp: 30 tablet, Rfl:    fluticasone (FLONASE) 50 MCG/ACT nasal spray, Place 1 spray into both nostrils daily. Begin by using 2 sprays  in each nare daily for 3 to 5 days, then decrease to 1 spray in each nare daily., Disp: 15.8 mL, Rfl: 2   levocetirizine (XYZAL) 5 MG tablet, Take 5 mg by mouth every evening., Disp: , Rfl:    metoprolol succinate (TOPROL-XL) 50 MG 24 hr tablet, TAKE 1 TABLET BY MOUTH ONCE DAILY. TAKE WITH OR IMMEDIATELY FOLLOWING A MEAL, Disp: 90 tablet, Rfl: 1   mometasone (ELOCON) 0.1 % cream, Apply 1 Application topically daily., Disp: 45 g, Rfl: 0   montelukast (SINGULAIR) 10 MG tablet, Take 10 mg by mouth daily., Disp: , Rfl:    Multiple Vitamins-Minerals (MULTIVITAMIN WITH MINERALS) tablet, Take 1 tablet by mouth daily., Disp: , Rfl:    nirmatrelvir/ritonavir (PAXLOVID) 20 x 150 MG & 10 x 100MG  TABS, Take 3 tablets by mouth 2 (two) times daily for 5 days. Patient GFR is 82. Take nirmatrelvir (150 mg) two tablets twice daily for 5 days and ritonavir (100 mg) one tablet twice daily for 5 days., Disp: 30 tablet, Rfl: 0   ondansetron (ZOFRAN-ODT) 4 MG disintegrating tablet, Take 1-2 tablets (4-8 mg total) by mouth every 8 (eight) hours as needed., Disp: 30 tablet, Rfl: 3   rizatriptan (MAXALT) 10 MG tablet, rizatriptan 10 mg tablet, Disp: , Rfl:    tiZANidine (ZANAFLEX) 4 MG capsule, tiZANidine HCl, Disp: , Rfl:    topiramate (TOPAMAX) 100 MG tablet, Take 1 tablet (100 mg total) by mouth daily., Disp: 90 tablet, Rfl: 2   Ubrogepant (UBRELVY) 100 MG TABS, Take 1 tablet (100 mg total)  by mouth every 2 (two) hours as needed. Maximum 200mg  a day., Disp: , Rfl:    zolmitriptan (ZOMIG) 5 MG tablet, Take 1 tablet (5 mg total) by mouth as needed for migraine. May repeat 1 tablet in 2 hours if headache returns or persists. Max 10 mg in 24 hours. Do not take more than 2 or 3 times per week., Disp: 10 tablet, Rfl: 0  Observations/Objective: There were no vitals filed for this visit. Physical Exam  Assessment and Plan: Positive self-administered antigen test for COVID-19 -     nirmatrelvir/ritonavir; Take 3 tablets by  mouth 2 (two) times daily for 5 days. Patient GFR is 82. Take nirmatrelvir (150 mg) two tablets twice daily for 5 days and ritonavir (100 mg) one tablet twice daily for 5 days.  Dispense: 30 tablet; Refill: 0    Follow Up Instructions: Return for keep same next.   I discussed the assessment and treatment plan with the patient. The patient was provided an opportunity to ask questions, and all were answered. The patient agreed with the plan and demonstrated an understanding of the instructions.   The patient was advised to call back or seek an in-person evaluation if the symptoms worsen or if the condition fails to improve as anticipated.  The above assessment and management plan was discussed with the patient. The patient verbalized understanding of and has agreed to the management plan.   Jeanell Sparrow, FNP, have reviewed all documentation for this visit. The documentation on 07/26/23 for the exam, diagnosis, procedures, and orders are all accurate and complete.

## 2023-07-26 NOTE — Patient Instructions (Addendum)
Take Vitamin C, D, Zinc.  Keep yourself hydrated with a lot of water and rest. Take Delsym for cough and Mucinex as need. Take Tylenol or pain reliever every 4-6 hours as needed for pain/fever/body ache. If you have elevated blood pressure, you can take OTC Corcidin. You can also take OTC oscillococcinum to help with your symptoms.  Educated patient if symptoms get worse or if she experiences any SOB, chest pain or pain in her legs to seek immediate emergency care. Continue to monitor your oxygen levels. Call us if you have any questions.

## 2023-07-26 NOTE — Assessment & Plan Note (Signed)
Advised patient to take Vitamin C, D, Zinc.  Keep yourself hydrated with a lot of water and rest. Take Delsym for cough and Mucinex as need. Take Tylenol or pain reliever every 4-6 hours as needed for pain/fever/body ache. If you have elevated blood pressure, you can take OTC Corcidin. You can also take OTC oscillococcinum to help with your symptoms.  Educated patient if symptoms get worse or if she experiences any SOB, chest pain or pain in her legs to seek immediate emergency care. Continue to monitor your oxygen levels. Call us if you have any questions. Quarantine for 5 days if symptom free for 24 hours may return prior to 5 day quarantine.

## 2023-07-30 ENCOUNTER — Encounter: Payer: Self-pay | Admitting: Nurse Practitioner

## 2023-09-13 ENCOUNTER — Encounter: Payer: 59 | Admitting: Nurse Practitioner

## 2023-09-21 ENCOUNTER — Other Ambulatory Visit: Payer: Self-pay | Admitting: Nurse Practitioner

## 2023-09-21 DIAGNOSIS — I10 Essential (primary) hypertension: Secondary | ICD-10-CM

## 2023-11-21 ENCOUNTER — Telehealth: Payer: 59 | Admitting: Neurology

## 2023-11-21 ENCOUNTER — Telehealth: Payer: Self-pay | Admitting: Neurology

## 2023-11-21 MED ORDER — ZOLMITRIPTAN 5 MG PO TABS: 5.0000 mg | ORAL_TABLET | ORAL | 0 refills | Status: DC | PRN

## 2023-11-21 NOTE — Telephone Encounter (Signed)
 Pt reports that she is out of her zolmitriptan (ZOMIG) 5 MG tablet she would like to know if Dr Lucia Gaskins will call this into [facility] [phone]

## 2023-11-21 NOTE — Telephone Encounter (Signed)
 [facility] cancellation

## 2023-11-21 NOTE — Telephone Encounter (Signed)
 1 refill sent

## 2023-11-28 ENCOUNTER — Telehealth: Payer: Self-pay | Admitting: *Deleted

## 2023-11-28 ENCOUNTER — Encounter: Payer: Self-pay | Admitting: Neurology

## 2023-11-28 NOTE — Telephone Encounter (Signed)
 Can we try to get approval for Bernita Raisin now as tried rizatriptan and zomig.

## 2023-11-28 NOTE — Telephone Encounter (Signed)
 I called pt.  She said that her headaches frontal head, pressure over L eye, cannot function at work.  Is taking qulipta which helped but now not working as well.  She did not use ubrelvy , was not approved, has tried rizatriptan and zomig (not helping).  Will try to get [place] authorized. She stated over the last 2-3 mons gotten worse.  I relayed if symptoms get worse then to proceed to ED/ urgent care.  She did ask about MRI.  I will try to move appt up. Possibly see NP, if opening.  Pt verbalized understanding.

## 2023-11-29 ENCOUNTER — Telehealth: Payer: Self-pay

## 2023-11-29 ENCOUNTER — Other Ambulatory Visit (HOSPITAL_COMMUNITY): Payer: Self-pay

## 2023-11-29 NOTE — Telephone Encounter (Signed)
 Pharmacy Patient Advocate Encounter   Received notification from Patient Pharmacy that prior authorization for Ubrelvy 100MG  tablets is required/requested.   Insurance verification completed.   The patient is insured through [facility] .   Per test claim: PA required; PA submitted to above mentioned insurance via CoverMyMeds Key/confirmation #/EOC [id number] Status is pending

## 2023-11-29 NOTE — Telephone Encounter (Signed)
 Notified patient.

## 2023-11-29 NOTE — Telephone Encounter (Signed)
 Per Dr. Lucia Gaskins ok to see NP.  I will look for sooner appt with NP.

## 2023-11-29 NOTE — Telephone Encounter (Signed)
 Pharmacy Patient Advocate Encounter  Received notification from [facility] that Prior Authorization for Ubrelvy 100MG  tablets has been APPROVED from 11/29/2023 to 11/28/2024. Ran test claim, Copay is $0 per 30DS/ 16 Tabs. This test claim was processed through [facility]- copay amounts may vary at other pharmacies due to pharmacy/plan contracts, or as the patient moves through the different stages of their insurance plan.   PA #/Case ID/Reference #: Shelia Mcgee (Key: [id number]) PA Case ID #: [id number]

## 2023-12-04 NOTE — Progress Notes (Unsigned)
 Patient: Shelia Mcgee Date of Birth: 1975-11-21  Reason for Visit: Follow up History from: Patient Primary Neurologist: Lucia Gaskins   ASSESSMENT AND PLAN 48 y.o. year old female   Chronic migraine headaches Obesity, BMI 52  -5 mild to moderate migraines weekly on Qulipta 60 mg daily -Check MRI of the brain with and without contrast given worsening migraine headache, chronic headache despite multiple medications -Stop Qulipta, switch to Ajovy 225 mg monthly injection for migraine prevention. She is not planning pregnancy, did advise pregnancy not recommend on injectable CGRP's, recommend discontinuing for 6 months prior to pregnancy. -Continue Ubrelvy 100 mg as needed for acute migraine treatment, Take 1 tablet at onset of headache, may repeat in 2 hours if needed. Max is 200 mg in 24 hours.  -Consider referral to sleep consult, previously had PSG in 2020 at [facility], I found in prior neuro note mention mild apnea, not enough to qualify for CPAP.  May consider sleep consult, especially if Ajovy is not helpful, 50 % of headaches occur in the morning. BMI 52 -Previously tried and failed: Qulipta, Topamax, Maxalt, Zomig, metoprolol -Next steps: Nurtec, Botox, Vyepti -Follow-up in 6 months with myself or Dr. Lucia Gaskins   Meds ordered this encounter  Medications   Fremanezumab-vfrm (AJOVY) 225 MG/1.5ML SOAJ    Sig: Inject 225 mg into the skin every 30 (thirty) days.    Dispense:  1.68 mL    Refill:  11   HISTORY OF PRESENT ILLNESS: Today 12/05/23 Saw Dr. Lucia Gaskins in October 2024 for migraines.  Started Nicolasa Ducking as needed.  Bernita Raisin was denied, Zomig sent in (previously failed Maxalt). Bernita Raisin recently approved through March 2026 with $0 coapy. Mentions history of sleep study, in 2020 at [place], was told no sleep apnea (PSG 10-28-2018 Impressions: Mild sleep apnea but not enough to qualify for CPAP). Here for sooner visit for headaches, mentions in the morning when getting day started developed  pressure behind left eye, sometimes goes away, often stay for the day. Has 5 headaches weekly mild to moderate, pressure stays behind left eye, radiates occipitally. 3 severe migraines in the last few months. 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 Thinks the Bennie Pierini has been helpful initially, then benefit has worn off. Stopped the Topamax. Using Zomig without benefit. Was just able to pick up the Ubrelvy. Snores if she is really tired, gets up 2-3 times to urinate at night, has daytime fatigue, takes naps on the weekend. Is not planning pregnancy. Works 2 jobs, and is in school for biblical studies.   HISTORY  06/21/23 Dr. Lucia Gaskins HPI:  Shelia Mcgee is a 48 y.o. female here as requested by Arnette Felts, FNP for migraines. has Essential hypertension; Routine general medical examination at a health care facility; Contraceptive management; Allergic rhinitis; Vitamin D deficiency; Arthritis; Morbid obesity (HCC); Migraine; Pain in joint of right hip; Migraine without aura and without status migrainosus, not intractable; BMI 45.0-49.9, adult (HCC); History of migraine; COVID-19 vaccine administered; and Chronic pain of both shoulders on their problem list.   Migraines started 4-5 years ago and mainly from stress. Throbbing, nausea, osmophobia, phonophobia (has to put in ear bud to cut out the sound) hurts to move, starts on the left, unilateral, behind the eyes, she had a sleep study, no brain imaging, no vision changes, not positional or exertional. No morning headaches but can wake up with some pressure in the head, often. No known food, wine, sleep triggers or other inciting events or triggers. Topiramate helps. Can  last 2-3 days. She has daily headaches. She has morning headaches and she wakes up with the headaches. They improve with medication. No other focal neurologic deficits, associated symptoms, inciting events or modifiable factors.  Medications tried > 3 months that can be used in  migraines: Amlodipine, metoprolol, topamax, aimovig, amitriptyline, gabapentin and below (from a thorough review of records)   ANALGESICS: Tylenol ANTI-MIGRAINE: Maxalt HEART/BP: Metoprolol, amlodipine DECONGESTANT/ANTIHISTAMINE: ANTI-NAUSEANT NSAIDS: diclofenac, Advil MUSCLE RELAXANTS: Flexeril, Tizanidine ANTI-CONVULSANTS: Gabapentin, Topamax STEROIDS: SLEEPING PILLS/TRANQUILIZERS: ANTI-DEPRESSANTS: HERBAL:  FIBROMYALGIA:  HORMONAL: OTHER:  PROCEDURES FOR HEADACHES:   REVIEW OF SYSTEMS: Out of a complete 14 system review of symptoms, the patient complains only of the following symptoms, and all other reviewed systems are negative.  See HPI  ALLERGIES: No Known Allergies  HOME MEDICATIONS: Outpatient Medications Prior to Visit  Medication Sig Dispense Refill   amLODipine (NORVASC) 10 MG tablet Take 1 tablet (10 mg total) by mouth daily. 90 tablet 2   Atogepant (QULIPTA) 60 MG TABS Take 1 tablet (60 mg total) by mouth daily. Please use copay card: BIN [id number] PCN [facility] GRP [id number] ID [id number] 30 tablet 11   Cholecalciferol (VITAMIN D3) 3000 UNITS TABS Take 1 tablet by mouth daily. 30 tablet    fluticasone (FLONASE) 50 MCG/ACT nasal spray Place 1 spray into both nostrils daily. Begin by using 2 sprays in each nare daily for 3 to 5 days, then decrease to 1 spray in each nare daily. 15.8 mL 2   levocetirizine (XYZAL) 5 MG tablet Take 5 mg by mouth every evening.     metoprolol succinate (TOPROL-XL) 50 MG 24 hr tablet TAKE 1 TABLET BY MOUTH ONCE DAILY WITH MEALS OR  IMMEDIATELY  AFTER 90 tablet 0   mometasone (ELOCON) 0.1 % cream Apply 1 Application topically daily. 45 g 0   montelukast (SINGULAIR) 10 MG tablet Take 10 mg by mouth daily.     Multiple Vitamins-Minerals (MULTIVITAMIN WITH MINERALS) tablet Take 1 tablet by mouth daily.     Ubrogepant (UBRELVY) 100 MG TABS Take 1 tablet (100 mg total) by mouth every 2 (two) hours as needed. Maximum 200mg  a day.     zolmitriptan  (ZOMIG) 5 MG tablet Take 1 tablet (5 mg total) by mouth as needed for migraine. May repeat 1 tablet in 2 hours if headache returns or persists. Max 10 mg in 24 hours. Do not take more than 2 or 3 times per week. 10 tablet 0   Atogepant (QULIPTA) 60 MG TABS Take 1 tablet (60 mg total) by mouth daily.     ondansetron (ZOFRAN-ODT) 4 MG disintegrating tablet Take 1-2 tablets (4-8 mg total) by mouth every 8 (eight) hours as needed. 30 tablet 3   rizatriptan (MAXALT) 10 MG tablet rizatriptan 10 mg tablet     tiZANidine (ZANAFLEX) 4 MG capsule tiZANidine HCl     topiramate (TOPAMAX) 100 MG tablet Take 1 tablet (100 mg total) by mouth daily. 90 tablet 2   No facility-administered medications prior to visit.    PAST MEDICAL HISTORY: Past Medical History:  Diagnosis Date   Allergic rhinitis 03/10/2015   Arthritis 11/08/2015   HTN (hypertension) 04/29/2014   Hypertension    Vaginitis 04/29/2014   Vitamin D deficiency 05/31/2015    PAST SURGICAL HISTORY: Past Surgical History:  Procedure Laterality Date   HIP SURGERY Right 1992   States that bones were slipping. Stabilized it with 3 screws x 1 yr then had screws removed.    FAMILY HISTORY: Family  History  Problem Relation Age of Onset   Hypertension Mother        died in MVA in her 27's   Hypertension Father        had ESRD, die due to infection from his graft   Diabetes Father    Stroke Father    Heart attack Maternal Uncle    Migraines Neg Hx     SOCIAL HISTORY: Social History   Socioeconomic History   Marital status: Single    Spouse name: Not on file   Number of children: Not on file   Years of education: Not on file   Highest education level: Bachelor's degree (e.g., BA, AB, BS)  Occupational History   Not on file  Tobacco Use   Smoking status: Never   Smokeless tobacco: Never  Substance and Sexual Activity   Alcohol use: No   Drug use: No   Sexual activity: Not Currently    Birth control/protection: None  Other Topics  Concern   Not on file  Social History Narrative   Lives alone, single   She works 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 [employer]   Enjoys bowling, spending time with friends, church   Social Drivers of Corporate investment banker Strain: Low Risk  (03/06/2023)   Overall Financial Resource Strain (CARDIA)    Difficulty of Paying Living Expenses: Not hard at all  Food Insecurity: No Food Insecurity (03/06/2023)   Hunger Vital Sign    Worried About Running Out of Food in the Last Year: Never true    Ran Out of Food in the Last Year: Never true  Transportation Needs: No Transportation Needs (03/06/2023)   PRAPARE - Administrator, Civil Service (Medical): No    Lack of Transportation (Non-Medical): No  Physical Activity: Unknown (03/06/2023)   Exercise Vital Sign    Days of Exercise per Week: Patient declined    Minutes of Exercise per Session: Not on file  Stress: No Stress Concern Present (03/06/2023)   [employer] of Occupational Health - Occupational Stress Questionnaire    Feeling of Stress : Not at all  Social Connections: Moderately Integrated (03/06/2023)   Social Connection and Isolation Panel [NHANES]    Frequency of Communication with Friends and Family: More than three times a week    Frequency of Social Gatherings with Friends and Family: Once a week    Attends Religious Services: More than 4 times per year    Active Member of [employer] or Organizations: Yes    Attends Banker Meetings: More than 4 times per year    Marital Status: Never married  Intimate Partner Violence: Unknown (12/08/2021)   Received from [employer] [facility], [facility]   HITS    Physically Hurt: Not on file    Insult or Talk Down To: Not on file    Threaten Physical Harm: Not on file    Scream or Curse: Not on file    PHYSICAL EXAM  Vitals:   12/05/23 0956  BP: 119/73  Pulse: 73  Weight: (!) 335 lb (152 kg)  Height: 5\' 7"  (1.702 m)    Body mass index is 52.47 kg/m.  Generalized: Well developed, in no acute distress  Neurological examination  Mentation: Alert oriented to time, place, history taking. Follows all commands speech and language fluent Cranial nerve II-XII: Pupils were equal round reactive to light. Extraocular movements were full, visual field were full on confrontational  test. Facial sensation and strength were normal.  Head turning and shoulder shrug  were normal and symmetric. Motor: The motor testing reveals 5 over 5 strength of all 4 extremities. Good symmetric motor tone is noted throughout.  Sensory: Sensory testing is intact to soft touch on all 4 extremities. No evidence of extinction is noted.  Coordination: Cerebellar testing reveals good finger-nose-finger and heel-to-shin bilaterally.  Gait and station: Gait is normal.  Reflexes: Deep tendon reflexes are symmetric and normal bilaterally.   DIAGNOSTIC DATA (LABS, IMAGING, TESTING) - I reviewed patient records, labs, notes, testing and imaging myself where available.  Lab Results  Component Value Date   WBC 6.5 03/22/2021   HGB 13.2 03/22/2021   HCT 39.6 03/22/2021   MCV 81.6 03/22/2021   PLT 334 03/22/2021      Component Value Date/Time   NA 138 03/07/2023 1558   K 4.4 03/07/2023 1558   CL 102 03/07/2023 1558   CO2 16 (L) 03/07/2023 1558   GLUCOSE 70 03/07/2023 1558   GLUCOSE 79 08/22/2022 0802   BUN 9 03/07/2023 1558   CREATININE 0.89 03/07/2023 1558   CREATININE 1.27 (H) 07/05/2020 0717   CALCIUM 9.3 03/07/2023 1558   PROT 7.1 01/10/2022 0924   ALBUMIN 4.2 01/10/2022 0924   AST 16 01/10/2022 0924   ALT 11 01/10/2022 0924   ALKPHOS 45 01/10/2022 0924   BILITOT 0.7 01/10/2022 0924   GFRNONAA >60 03/22/2021 1817   GFRAA 74 (L) 01/13/2014 1830   Lab Results  Component Value Date   CHOL 169 11/27/2022   HDL 63 11/27/2022   LDLCALC 93 11/27/2022   TRIG 66 11/27/2022   CHOLHDL 2.7 11/27/2022   Lab Results  Component Value  Date   HGBA1C 5.3 11/27/2022   No results found for: "VITAMINB12" Lab Results  Component Value Date   TSH 1.000 11/27/2022    Margie Ege, AGNP-C, DNP 12/05/2023, 10:16 AM [facility] [address], [address] [place], [place] [address] [phone]

## 2023-12-05 ENCOUNTER — Encounter: Payer: Self-pay | Admitting: Neurology

## 2023-12-05 ENCOUNTER — Ambulatory Visit (INDEPENDENT_AMBULATORY_CARE_PROVIDER_SITE_OTHER): Admitting: Neurology

## 2023-12-05 VITALS — BP 119/73 | HR 73 | Ht 67.0 in | Wt 335.0 lb

## 2023-12-05 DIAGNOSIS — G43009 Migraine without aura, not intractable, without status migrainosus: Secondary | ICD-10-CM

## 2023-12-05 MED ORDER — AJOVY 225 MG/1.5ML ~~LOC~~ SOAJ: 225.0000 mg | SUBCUTANEOUS | 11 refills | Status: DC

## 2023-12-05 NOTE — Patient Instructions (Addendum)
 Check MRI of the brain  Stop Qulipta, switch to Ajovy for migraine prevention. 1 injection monthly for migraine prevention Continue Ubrelvy at onset of migraine. Take 1 tablet at onset of headache, may repeat in 2 hours if needed. Max is 200 mg in 24 hours.

## 2023-12-06 ENCOUNTER — Telehealth: Payer: Self-pay | Admitting: *Deleted

## 2023-12-06 ENCOUNTER — Encounter: Payer: Self-pay | Admitting: *Deleted

## 2023-12-06 NOTE — Telephone Encounter (Signed)
error 

## 2023-12-12 ENCOUNTER — Encounter: Payer: Self-pay | Admitting: *Deleted

## 2023-12-12 ENCOUNTER — Telehealth: Payer: Self-pay | Admitting: Neurology

## 2023-12-12 ENCOUNTER — Telehealth: Payer: Self-pay | Admitting: *Deleted

## 2023-12-12 NOTE — Telephone Encounter (Signed)
 [employer] Berkley Harvey: [id number] exp. 12/12/23-01/26/24 sent to GI due to her weight. [phone]

## 2023-12-12 NOTE — Telephone Encounter (Signed)
 Pt fmla form is ready for p/u

## 2023-12-13 ENCOUNTER — Encounter: Payer: Self-pay | Admitting: Neurology

## 2023-12-17 ENCOUNTER — Other Ambulatory Visit: Payer: Self-pay | Admitting: Nurse Practitioner

## 2023-12-17 DIAGNOSIS — I10 Essential (primary) hypertension: Secondary | ICD-10-CM

## 2024-01-08 ENCOUNTER — Telehealth: Admitting: Neurology

## 2024-01-19 ENCOUNTER — Ambulatory Visit
Admission: RE | Admit: 2024-01-19 | Discharge: 2024-01-19 | Disposition: A | Discharge status: Home / Self Care | Payer: Self-pay | Source: Ambulatory Visit | Attending: Neurology | Admitting: Neurology

## 2024-01-19 DIAGNOSIS — G43009 Migraine without aura, not intractable, without status migrainosus: Secondary | ICD-10-CM

## 2024-03-09 ENCOUNTER — Other Ambulatory Visit: Payer: Self-pay | Admitting: Nurse Practitioner

## 2024-03-09 DIAGNOSIS — I10 Essential (primary) hypertension: Secondary | ICD-10-CM

## 2024-03-15 ENCOUNTER — Other Ambulatory Visit: Payer: Self-pay | Admitting: Nurse Practitioner

## 2024-06-02 ENCOUNTER — Other Ambulatory Visit: Payer: Self-pay | Admitting: Nurse Practitioner

## 2024-06-02 DIAGNOSIS — I10 Essential (primary) hypertension: Secondary | ICD-10-CM

## 2024-06-06 ENCOUNTER — Other Ambulatory Visit (HOSPITAL_BASED_OUTPATIENT_CLINIC_OR_DEPARTMENT_OTHER): Payer: Self-pay

## 2024-06-06 MED ORDER — FLUZONE 0.5 ML IM SUSY
0.5000 mL | PREFILLED_SYRINGE | Freq: Once | INTRAMUSCULAR | 0 refills | Status: AC
  Filled 2024-06-06: qty 0.5, 1d supply, fill #0

## 2024-06-09 ENCOUNTER — Telehealth: Payer: Self-pay | Admitting: Neurology

## 2024-06-09 NOTE — Telephone Encounter (Signed)
 LVM and sent mychart msg informing pt of need to reschedule 06/24/24 appt - MD departure

## 2024-06-09 NOTE — Telephone Encounter (Signed)
 Pt called Returning call informed Pt that she will get a call back   Callback number  [phone]

## 2024-06-10 NOTE — Telephone Encounter (Signed)
 Spoke with patient and rescheduled appt with Dr. Gregg for 07/08/24 at 8:45am

## 2024-06-24 ENCOUNTER — Ambulatory Visit: Admitting: Neurology

## 2024-06-26 ENCOUNTER — Other Ambulatory Visit (HOSPITAL_BASED_OUTPATIENT_CLINIC_OR_DEPARTMENT_OTHER): Payer: Self-pay | Admitting: Registered Nurse

## 2024-06-26 DIAGNOSIS — Z1231 Encounter for screening mammogram for malignant neoplasm of breast: Secondary | ICD-10-CM

## 2024-07-03 ENCOUNTER — Encounter (HOSPITAL_BASED_OUTPATIENT_CLINIC_OR_DEPARTMENT_OTHER): Payer: Self-pay

## 2024-07-03 ENCOUNTER — Ambulatory Visit (HOSPITAL_BASED_OUTPATIENT_CLINIC_OR_DEPARTMENT_OTHER)
Admission: RE | Admit: 2024-07-03 | Discharge: 2024-07-03 | Disposition: A | Discharge status: Home / Self Care | Source: Ambulatory Visit | Attending: Registered Nurse | Admitting: Registered Nurse

## 2024-07-03 DIAGNOSIS — Z1231 Encounter for screening mammogram for malignant neoplasm of breast: Secondary | ICD-10-CM | POA: Diagnosis present

## 2024-07-08 ENCOUNTER — Encounter: Payer: Self-pay | Admitting: Neurology

## 2024-07-08 ENCOUNTER — Ambulatory Visit: Admitting: Neurology

## 2024-07-08 DIAGNOSIS — Z8669 Personal history of other diseases of the nervous system and sense organs: Secondary | ICD-10-CM

## 2024-07-08 DIAGNOSIS — G43009 Migraine without aura, not intractable, without status migrainosus: Secondary | ICD-10-CM

## 2024-07-08 DIAGNOSIS — H1045 Other chronic allergic conjunctivitis: Secondary | ICD-10-CM | POA: Insufficient documentation

## 2024-07-08 MED ORDER — ATOGEPANT 60 MG PO TABS: 60.0000 mg | ORAL_TABLET | Freq: Every day | ORAL | 11 refills | Status: AC

## 2024-07-08 MED ORDER — UBRELVY 100 MG PO TABS: 100.0000 mg | ORAL_TABLET | ORAL | 11 refills | Status: DC | PRN

## 2024-07-08 NOTE — Patient Instructions (Signed)
 Continue with Qulipta  daily as migraine prevention Continue with Ubrelvy  as migraine abortive medication Please contact your sleep doctors and repeat a sleep study Also discussed weight management. Keep a headache diary Follow-up in 6 months or sooner if worse

## 2024-07-08 NOTE — Telephone Encounter (Signed)
 Patient had an appointment today and was wanting to pay the $50 for the forms. I wanted to check with you to see if we still have her FMLA forms before taking any money from her. She said to let her know and she will pay the $50 and you can send them to her through mychart

## 2024-07-08 NOTE — Progress Notes (Signed)
 [facility]  PATIENT: Shelia Mcgee DOB: [birth year]  REQUESTING CLINICIAN: Georgina Speaks, FNP HISTORY FROM: Patient and chart review REASON FOR VISIT: Migraine follow-up   HISTORICAL  CHIEF COMPLAINT:  Chief Complaint  Patient presents with   RM13/MIGRAINES    Pt is here Alone. Pt states that her migraines have been controlled since her last appointment.     HISTORY OF PRESENT ILLNESS:  Discussed the use of AI scribe software for clinical note transcription with the patient, who gave verbal consent to proceed.   Shelia Mcgee is a 48 year old female with a history of headaches who presents for management of her headache symptoms.   She experiences headaches approximately two to three times per month, with each episode lasting up to three days, especially if unable to rest due to work commitments. Stress and life events can influence the frequency of her headaches. She manages her headaches with Ubrelvy , sleep, and quiet time, and is on Qulipta  for preventive treatment.  She has tried various medications for her headaches, including Topamax , Maxalt, and Zomig , but has not used Botox. She did not pursue Ajovy  due to cost. Her headaches have decreased in frequency and severity since cutting her hair, which was previously long and caused tension headaches. She also tries to maintain good posture to prevent neck strain.  She was previously told she had very mild sleep apnea and did not require a machine, but has not completed recently a sleep study. Her sleep patterns include waking up twice a night, often to use the restroom. She has been trying to improve her sleep by cutting off the TV and sleeping in total darkness. She does not feel her sleep quality has changed recently.  She monitors her blood pressure at home and reports it is well-controlled.     OTHER MEDICAL CONDITIONS: Migraine headaches, obesity, hypertension   REVIEW OF SYSTEMS: Full 14 system  review of systems performed and negative with exception of: As noted in the HPI  ALLERGIES: No Known Allergies  HOME MEDICATIONS: Outpatient Medications Prior to Visit  Medication Sig Dispense Refill   amLODipine  (NORVASC ) 10 MG tablet Take 1 tablet by mouth once daily 90 tablet 2   Cholecalciferol (VITAMIN D3) 3000 UNITS TABS Take 1 tablet by mouth daily. 30 tablet    fluticasone  (FLONASE ) 50 MCG/ACT nasal spray Place 1 spray into both nostrils daily. Begin by using 2 sprays in each nare daily for 3 to 5 days, then decrease to 1 spray in each nare daily. 15.8 mL 2   levocetirizine (XYZAL) 5 MG tablet Take 5 mg by mouth every evening.     metoprolol  succinate (TOPROL -XL) 50 MG 24 hr tablet TAKE 1 TABLET BY MOUTH ONCE DAILY WITH  MEALS  OR  IMMEDIATELY  AFTER 90 tablet 0   mometasone  (ELOCON ) 0.1 % cream Apply 1 Application topically daily. 45 g 0   montelukast (SINGULAIR) 10 MG tablet Take 10 mg by mouth daily.     Multiple Vitamins-Minerals (MULTIVITAMIN WITH MINERALS) tablet Take 1 tablet by mouth daily.     Atogepant  60 MG TABS Take 60 mg by mouth.     Ubrogepant  (UBRELVY ) 100 MG TABS Take 1 tablet (100 mg total) by mouth every 2 (two) hours as needed. Maximum 200mg  a day.     Fremanezumab -vfrm (AJOVY ) 225 MG/1.5ML SOAJ Inject 225 mg into the skin every 30 (thirty) days. (Patient not taking: Reported on 07/08/2024) 1.68 mL 11   No facility-administered medications prior to  visit.    PAST MEDICAL HISTORY: Past Medical History:  Diagnosis Date   Allergic rhinitis 03/10/2015   Arthritis 11/08/2015   HTN (hypertension) 04/29/2014   Hypertension    Vaginitis 04/29/2014   Vitamin D  deficiency 05/31/2015    PAST SURGICAL HISTORY: Past Surgical History:  Procedure Laterality Date   HIP SURGERY Right 1992   States that bones were slipping. Stabilized it with 3 screws x 1 yr then had screws removed.    FAMILY HISTORY: Family History  Problem Relation Age of Onset   Hypertension Mother         died in MVA in her 21's   Hypertension Father        had ESRD, die due to infection from his graft   Diabetes Father    Stroke Father    Heart attack Maternal Uncle    Migraines Neg Hx     SOCIAL HISTORY: Social History   Socioeconomic History   Marital status: Single    Spouse name: Not on file   Number of children: Not on file   Years of education: Not on file   Highest education level: Bachelor's degree (e.g., BA, AB, BS)  Occupational History   Not on file  Tobacco Use   Smoking status: Never   Smokeless tobacco: Never  Substance and Sexual Activity   Alcohol use: No   Drug use: No   Sexual activity: Not Currently    Birth control/protection: None  Other Topics Concern   Not on file  Social History Narrative   Lives alone, single   She works at [employer]- works in Youth Worker in [employer]   Enjoys bowling, spending time with friends, church   Social Drivers of Corporate Investment Banker Strain: Low Risk  (07/07/2024)   Received from [facility]   Overall Financial Resource Strain (CARDIA)    How hard is it for you to pay for the very basics like food, housing, medical care, and heating?: Not hard at all  Food Insecurity: No Food Insecurity (07/07/2024)   Received from [facility]   Hunger Vital Sign    Within the past 12 months, you worried that your food would run out before you got the money to buy more.: Never true    Within the past 12 months, the food you bought just didn't last and you didn't have money to get more.: Never true  Transportation Needs: No Transportation Needs (07/07/2024)   Received from [facility] - Transportation    In the past 12 months, has lack of transportation kept you from medical appointments or from getting medications?: No    In the past 12 months, has lack of transportation kept you from meetings, work, or from getting things needed for daily living?: No  Physical  Activity: Inactive (07/07/2024)   Received from [facility]   Exercise Vital Sign    On average, how many days per week do you engage in moderate to strenuous exercise (like a brisk walk)?: 0 days    Minutes of Exercise per Session: Not on file  Stress: No Stress Concern Present (07/07/2024)   Received from [facility] - Occupational Stress Questionnaire    Do you feel stress - tense, restless, nervous, or anxious, or unable to sleep at night because your mind is troubled all the time - these days?: Not at all  Social Connections: Socially Integrated (  07/07/2024)   Received from [facility]   Social Network    How would you rate your social network (family, work, friends)?: Good participation with social networks  Intimate Partner Violence: Not At Risk (07/07/2024)   Received from [facility]   HITS    Over the last 12 months how often did your partner physically hurt you?: Never    Over the last 12 months how often did your partner insult you or talk down to you?: Never    Over the last 12 months how often did your partner threaten you with physical harm?: Never    Over the last 12 months how often did your partner scream or curse at you?: Never     PHYSICAL EXAM  GENERAL EXAM/CONSTITUTIONAL: Vitals:  Vitals:   07/08/24 0835  BP: 101/71  Pulse: 89  SpO2: 98%  Weight: (!) 341 lb (154.7 kg)  Height: 5' 7 (1.702 m)   Body mass index is 53.41 kg/m. Wt Readings from Last 3 Encounters:  07/08/24 (!) 341 lb (154.7 kg)  12/05/23 (!) 335 lb (152 kg)  06/21/23 (!) 319 lb (144.7 kg)   Patient is in no distress; well developed, nourished and groomed; neck is supple  MUSCULOSKELETAL: Gait, strength, tone, movements noted in Neurologic exam below  NEUROLOGIC: MENTAL STATUS:      No data to display         awake, alert, oriented to person, place and time recent and remote memory intact normal attention and  concentration language fluent, comprehension intact, naming intact fund of knowledge appropriate  CRANIAL NERVE:  2nd, 3rd, 4th, 6th - Visual fields full to confrontation, extraocular muscles intact, no nystagmus 5th - facial sensation symmetric 7th - facial strength symmetric 8th - hearing intact 9th - palate elevates symmetrically, uvula midline 11th - shoulder shrug symmetric 12th - tongue protrusion midline  MOTOR:  normal bulk and tone, full strength in the BUE, BLE  SENSORY:  normal and symmetric to light touch  COORDINATION:  finger-nose-finger, fine finger movements normal  GAIT/STATION:  normal     DIAGNOSTIC DATA (LABS, IMAGING, TESTING) - I reviewed patient records, labs, notes, testing and imaging myself where available.  Lab Results  Component Value Date   WBC 6.5 03/22/2021   HGB 13.2 03/22/2021   HCT 39.6 03/22/2021   MCV 81.6 03/22/2021   PLT 334 03/22/2021      Component Value Date/Time   NA 138 03/07/2023 1558   K 4.4 03/07/2023 1558   CL 102 03/07/2023 1558   CO2 16 (L) 03/07/2023 1558   GLUCOSE 70 03/07/2023 1558   GLUCOSE 79 08/22/2022 0802   BUN 9 03/07/2023 1558   CREATININE 0.89 03/07/2023 1558   CREATININE 1.27 (H) 07/05/2020 0717   CALCIUM 9.3 03/07/2023 1558   PROT 7.1 01/10/2022 0924   ALBUMIN 4.2 01/10/2022 0924   AST 16 01/10/2022 0924   ALT 11 01/10/2022 0924   ALKPHOS 45 01/10/2022 0924   BILITOT 0.7 01/10/2022 0924   GFRNONAA >60 03/22/2021 1817   GFRAA 74 (L) 01/13/2014 1830   Lab Results  Component Value Date   CHOL 169 11/27/2022   HDL 63 11/27/2022   LDLCALC 93 11/27/2022   TRIG 66 11/27/2022   CHOLHDL 2.7 11/27/2022   Lab Results  Component Value Date   HGBA1C 5.3 11/27/2022   No results found for: [id number] Lab Results  Component Value Date   TSH 1.000 11/27/2022     ASSESSMENT AND PLAN  48  y.o. year old female with    Migraine without aura Chronic migraines occur 2-3 times per month,  lasting up to three days, often triggered by stress and menstrual cycle. Current treatment includes Qulipta  and Ubrelvy , effectively reducing frequency and severity. Previous medications included Topamax , Maxalt, and Zomig . Haircut reduced tension-type headaches. Discussed potential for Botox and Vyepti infusion as future options if headaches increase. - Continue Qulipta  and Ubrelvy  for migraine management. - Provided 11 refills for Qulipta  and Ubrelvy . - Monitor headache frequency and duration - Will consider Botox or Vyepti infusion if headaches increase.  Obstructive sleep apnea (history of) Mild obstructive sleep apnea with no current CPAP use. Sleep patterns unchanged, with occasional nocturnal awakenings. Discussed impact of untreated sleep apnea on headaches and overall health. Emphasized importance of sleep study to confirm diagnosis and guide treatment. - Recommended completing a sleep study to assess current status of sleep apnea. - Discussed potential benefits of CPAP and weight management if sleep apnea is confirmed.     1. Morbid obesity (HCC)   2. Migraine without aura and without status migrainosus, not intractable   3. History of sleep apnea      Patient Instructions  Continue with Qulipta  daily as migraine prevention Continue with Ubrelvy  as migraine abortive medication Please contact your sleep doctors and repeat a sleep study Also discussed weight management. Keep a headache diary Follow-up in 6 months or sooner if worse  No orders of the defined types were placed in this encounter.   Meds ordered this encounter  Medications   Ubrogepant  (UBRELVY ) 100 MG TABS    Sig: Take 1 tablet (100 mg total) by mouth every 2 (two) hours as needed. Maximum 200mg  a day.    Dispense:  8 tablet    Refill:  11   Atogepant  60 MG TABS    Sig: Take 1 tablet (60 mg total) by mouth daily.    Dispense:  30 tablet    Refill:  11    Return in about 6 months (around  01/05/2025).    Pastor Falling, MD 07/08/2024, 9:28 AM  [facility] [address] [place], [place] [address] [phone]

## 2024-07-21 ENCOUNTER — Encounter: Admitting: Obstetrics & Gynecology

## 2024-07-29 ENCOUNTER — Telehealth: Payer: Self-pay

## 2024-07-29 ENCOUNTER — Other Ambulatory Visit (HOSPITAL_COMMUNITY): Payer: Self-pay

## 2024-07-29 NOTE — Telephone Encounter (Signed)
 Pharmacy Patient Advocate Encounter   Received notification from CoverMyMeds that prior authorization for Qulipta  is required/requested.   Insurance verification completed.   The patient is insured through [facility].   Per test claim: PA required; PA submitted to above mentioned insurance via Latent Key/confirmation #/EOC [id number] Status is pending

## 2024-08-04 ENCOUNTER — Other Ambulatory Visit (HOSPITAL_COMMUNITY): Payer: Self-pay

## 2024-08-04 NOTE — Telephone Encounter (Signed)
 Clinical questions have been answered and PA submitted. PA currently Pending. Please be advised that most companies allow up to 30 days to make a decision. We will advise when a determination has been made, or follow up in 1 week.   Please reach out to our team, Rx Prior Auth Pool, if you haven't heard back in a week.

## 2024-08-04 NOTE — Telephone Encounter (Signed)
 Pharmacy Patient Advocate Encounter  Received notification from [employer] that Prior Authorization for Qulipta  60MG  tablets  has been APPROVED from 08/04/2024 to 08/04/2025. Unable to obtain price due to refill too soon rejection, last fill date 08/04/2024 next available fill date12/24/2025   PA #/Case ID/Reference #: [id number]

## 2024-08-12 ENCOUNTER — Ambulatory Visit: Admitting: Family

## 2024-09-30 ENCOUNTER — Telehealth: Payer: Self-pay | Admitting: Neurology

## 2024-09-30 NOTE — Telephone Encounter (Signed)
 Pt called to request to speak to Nurse Pt stated she is having really bad head aches which is causing tension in her neck and not sure what to do

## 2024-10-01 ENCOUNTER — Ambulatory Visit (INDEPENDENT_AMBULATORY_CARE_PROVIDER_SITE_OTHER): Admitting: Neurology

## 2024-10-01 ENCOUNTER — Encounter: Payer: Self-pay | Admitting: Neurology

## 2024-10-01 VITALS — BP 120/82 | HR 67 | Ht 68.0 in | Wt 331.0 lb

## 2024-10-01 DIAGNOSIS — G43009 Migraine without aura, not intractable, without status migrainosus: Secondary | ICD-10-CM | POA: Diagnosis not present

## 2024-10-01 MED ORDER — NURTEC 75 MG PO TBDP: 75.0000 mg | ORAL_TABLET | ORAL | 11 refills | Status: AC | PRN

## 2024-10-01 MED ORDER — TIZANIDINE HCL 2 MG PO TABS: 2.0000 mg | ORAL_TABLET | Freq: Three times a day (TID) | ORAL | 5 refills | Status: AC | PRN

## 2024-10-01 MED ORDER — ONDANSETRON 4 MG PO TBDP: 4.0000 mg | ORAL_TABLET | Freq: Three times a day (TID) | ORAL | 0 refills | Status: AC | PRN

## 2024-10-01 NOTE — Telephone Encounter (Signed)
 I called and spoke to pt continues with headaches. Frontal, sporadic nausea/light sensitivity.  Feels that  headaches more intense.  ? If neck tension cause headaches or vice versa. I made f/u to see NP today at 1245.  She feels that has 10 migraines days/month.

## 2024-10-01 NOTE — Progress Notes (Signed)
 "  Patient: Shelia Mcgee Date of Birth: 07-05-1976  Reason for Visit: Follow up History from: Patient Primary Neurologist: Ahern/Camara  ASSESSMENT AND PLAN 49 y.o. year old female   Chronic migraine headaches Obesity, BMI 50  -Stop Ubrelvy , try Nurtec 75 mg as needed for acute migraine.  May combine with tizanidine , Aleve  for prolonged headache, also Zofran  for nausea - Check home sleep study to rule out sleep apnea.  Reports fatigue, morning headache, nocturia - Continue Qulipta  60 mg daily for migraine prevention - Never had MRI of the brain I ordered, she was anxious - Previously tried and failed:Topamax , Maxalt, Zomig , metoprolol  - Next steps: Ajovy , Botox, Vyepti - Keep follow-up appointment in June with me  Meds ordered this encounter  Medications   Rimegepant Sulfate (NURTEC) 75 MG TBDP    Sig: Take 1 tablet (75 mg total) by mouth as needed. Take 1 tablet at onset of headache, max is 1 tablet in 24 hours.    Dispense:  8 tablet    Refill:  11   tiZANidine  (ZANAFLEX ) 2 MG tablet    Sig: Take 1 tablet (2 mg total) by mouth every 8 (eight) hours as needed for muscle spasms.    Dispense:  90 tablet    Refill:  5   ondansetron  (ZOFRAN -ODT) 4 MG disintegrating tablet    Sig: Take 1 tablet (4 mg total) by mouth every 8 (eight) hours as needed for nausea or vomiting.    Dispense:  20 tablet    Refill:  0   HISTORY OF PRESENT ILLNESS: Today 10/01/24 10/01/24 SS: Here today for sooner visit. Remains on Qulipta  60 mg daily for migraine prevention. Working well, uses Ubrelvy  PRN, takes Ubrelvy  3-4 times a month, doesn't help much. Her headaches can last a week. Typical migraine dull achy, frontal, sensitive to light and and sound, nausea. Tension left occipital area. Right now migraine for 5 days, frontal area, feels like migraine. No illness or new issues. We talked about sleep study, gets up twice to use bathroom, sleeps alone so unclear snoring, morning headache and fatigue. Never  had MRI I ordered, got too scared when she went. Right now 8/10 migraine, resting comfortably.   07/08/24 Dr. Gregg: She experiences headaches approximately two to three times per month, with each episode lasting up to three days, especially if unable to rest due to work commitments. Stress and life events can influence the frequency of her headaches. She manages her headaches with Ubrelvy , sleep, and quiet time, and is on Qulipta  for preventive treatment.   She has tried various medications for her headaches, including Topamax , Maxalt, and Zomig , but has not used Botox. She did not pursue Ajovy  due to cost. Her headaches have decreased in frequency and severity since cutting her hair, which was previously long and caused tension headaches. She also tries to maintain good posture to prevent neck strain.   She was previously told she had very mild sleep apnea and did not require a machine, but has not completed recently a sleep study. Her sleep patterns include waking up twice a night, often to use the restroom. She has been trying to improve her sleep by cutting off the TV and sleeping in total darkness. She does not feel her sleep quality has changed recently.   She monitors her blood pressure at home and reports it is well-controlled.  12/05/23 SS: Saw Dr. Ines in October 2024 for migraines.  Started Qulipta , Ubrelvy  as needed.  Ubrelvy  was denied, Zomig  sent  in (previously failed Maxalt). Ubrelvy  recently approved through March 2026 with $0 coapy. Mentions history of sleep study, in 2020 at [facility], was told no sleep apnea (PSG 10-28-2018 Impressions: Mild sleep apnea but not enough to qualify for CPAP). Here for sooner visit for headaches, mentions in the morning when getting day started developed pressure behind left eye, sometimes goes away, often stay for the day. Has 5 headaches weekly mild to moderate, pressure stays behind left eye, radiates occipitally. 3 severe migraines in the last few months.  Works as copywriter, advertising, desk work with animator. Thinks the Qulipta  has been helpful initially, then benefit has worn off. Stopped the Topamax . Using Zomig  without benefit. Was just able to pick up the Ubrelvy . Snores if she is really tired, gets up 2-3 times to urinate at night, has daytime fatigue, takes naps on the weekend. Is not planning pregnancy. Works 2 jobs, and is in school for biblical studies.   HISTORY  06/21/23 Dr. Ines HPI:  Shelia Mcgee is a 49 y.o. female here as requested by Georgina Speaks, FNP for migraines. has Essential hypertension; Routine general medical examination at a health care facility; Contraceptive management; Allergic rhinitis; Vitamin D  deficiency; Arthritis; Morbid obesity (HCC); Migraine; Pain in joint of right hip; Migraine without aura and without status migrainosus, not intractable; BMI 45.0-49.9, adult (HCC); History of migraine; COVID-19 vaccine administered; and Chronic pain of both shoulders on their problem list.   Migraines started 4-5 years ago and mainly from stress. Throbbing, nausea, osmophobia, phonophobia (has to put in ear bud to cut out the sound) hurts to move, starts on the left, unilateral, behind the eyes, she had a sleep study, no brain imaging, no vision changes, not positional or exertional. No morning headaches but can wake up with some pressure in the head, often. No known food, wine, sleep triggers or other inciting events or triggers. Topiramate  helps. Can last 2-3 days. She has daily headaches. She has morning headaches and she wakes up with the headaches. They improve with medication. No other focal neurologic deficits, associated symptoms, inciting events or modifiable factors.  Medications tried > 3 months that can be used in migraines: Amlodipine , metoprolol , topamax , aimovig, amitriptyline, gabapentin and below (from a thorough review of records)   ANALGESICS: Tylenol  ANTI-MIGRAINE: Maxalt HEART/BP: Metoprolol ,  amlodipine  DECONGESTANT/ANTIHISTAMINE: ANTI-NAUSEANT NSAIDS: diclofenac, Advil  MUSCLE RELAXANTS: Flexeril , Tizanidine  ANTI-CONVULSANTS: Gabapentin, Topamax  STEROIDS: SLEEPING PILLS/TRANQUILIZERS: ANTI-DEPRESSANTS: HERBAL:  FIBROMYALGIA:  HORMONAL: OTHER:  PROCEDURES FOR HEADACHES:   REVIEW OF SYSTEMS: Out of a complete 14 system review of symptoms, the patient complains only of the following symptoms, and all other reviewed systems are negative.  See HPI  ALLERGIES: No Known Allergies  HOME MEDICATIONS: Outpatient Medications Prior to Visit  Medication Sig Dispense Refill   amLODipine  (NORVASC ) 10 MG tablet Take 1 tablet by mouth once daily 90 tablet 2   Atogepant  60 MG TABS Take 1 tablet (60 mg total) by mouth daily. 30 tablet 11   Cholecalciferol (VITAMIN D3) 3000 UNITS TABS Take 1 tablet by mouth daily. 30 tablet    fluticasone  (FLONASE ) 50 MCG/ACT nasal spray Place 1 spray into both nostrils daily. Begin by using 2 sprays in each nare daily for 3 to 5 days, then decrease to 1 spray in each nare daily. 15.8 mL 2   levocetirizine (XYZAL) 5 MG tablet Take 5 mg by mouth every evening.     metoprolol  succinate (TOPROL -XL) 50 MG 24 hr tablet TAKE 1 TABLET BY MOUTH ONCE DAILY WITH  MEALS  OR  IMMEDIATELY  AFTER 90 tablet 0   mometasone  (ELOCON ) 0.1 % cream Apply 1 Application topically daily. 45 g 0   montelukast (SINGULAIR) 10 MG tablet Take 10 mg by mouth daily.     Multiple Vitamins-Minerals (MULTIVITAMIN WITH MINERALS) tablet Take 1 tablet by mouth daily.     Ubrogepant  (UBRELVY ) 100 MG TABS Take 1 tablet (100 mg total) by mouth every 2 (two) hours as needed. Maximum 200mg  a day. 8 tablet 11   No facility-administered medications prior to visit.    PAST MEDICAL HISTORY: Past Medical History:  Diagnosis Date   Allergic rhinitis 03/10/2015   Arthritis 11/08/2015   HTN (hypertension) 04/29/2014   Hypertension    Vaginitis 04/29/2014   Vitamin D  deficiency 05/31/2015    PAST  SURGICAL HISTORY: Past Surgical History:  Procedure Laterality Date   HIP SURGERY Right 1992   States that bones were slipping. Stabilized it with 3 screws x 1 yr then had screws removed.    FAMILY HISTORY: Family History  Problem Relation Age of Onset   Hypertension Mother        died in MVA in her 36's   Hypertension Father        had ESRD, die due to infection from his graft   Diabetes Father    Stroke Father    Heart attack Maternal Uncle    Migraines Neg Hx     SOCIAL HISTORY: Social History   Socioeconomic History   Marital status: Single    Spouse name: Not on file   Number of children: Not on file   Years of education: Not on file   Highest education level: Bachelor's degree (e.g., BA, AB, BS)  Occupational History   Not on file  Tobacco Use   Smoking status: Never   Smokeless tobacco: Never  Vaping Use   Vaping status: Never Used  Substance and Sexual Activity   Alcohol use: No   Drug use: No   Sexual activity: Not Currently    Birth control/protection: None  Other Topics Concern   Not on file  Social History Narrative   Lives alone, single   She works at [employer]- works in Youth Worker in [employer]   Enjoys bowling, spending time with friends, church   Social Drivers of Health   Tobacco Use: Low Risk (10/01/2024)   Patient History    Smoking Tobacco Use: Never    Smokeless Tobacco Use: Never    Passive Exposure: Not on file  Financial Resource Strain: Low Risk (07/25/2024)   Received from [facility]   Overall Financial Resource Strain (CARDIA)    How hard is it for you to pay for the very basics like food, housing, medical care, and heating?: Not hard at all  Food Insecurity: No Food Insecurity (07/25/2024)   Received from [facility]   Epic    Within the past 12 months, you worried that your food would run out before you got the money to buy more.: Never true    Within the past 12 months, the food you  bought just didn't last and you didn't have money to get more.: Never true  Transportation Needs: No Transportation Needs (07/25/2024)   Received from [facility]    In the past 12 months, has lack of transportation kept you from medical appointments or from getting medications?: No    In the past 12 months, has lack of transportation  kept you from meetings, work, or from getting things needed for daily living?: No  Physical Activity: Inactive (07/25/2024)   Received from [facility]   Exercise Vital Sign    On average, how many days per week do you engage in moderate to strenuous exercise (like a brisk walk)?: 0 days    Minutes of Exercise per Session: Not on file  Stress: No Stress Concern Present (07/25/2024)   Received from [facility] - Occupational Stress Questionnaire    Do you feel stress - tense, restless, nervous, or anxious, or unable to sleep at night because your mind is troubled all the time - these days?: Not at all  Social Connections: Socially Integrated (07/25/2024)   Received from [facility]   Social Network    How would you rate your social network (family, work, friends)?: Good participation with social networks  Intimate Partner Violence: Not At Risk (07/25/2024)   Received from [facility]   HITS    Over the last 12 months how often did your partner physically hurt you?: Never    Over the last 12 months how often did your partner insult you or talk down to you?: Never    Over the last 12 months how often did your partner threaten you with physical harm?: Never    Over the last 12 months how often did your partner scream or curse at you?: Never  Depression (PHQ2-9): Low Risk (11/27/2022)   Depression (PHQ2-9)    PHQ-2 Score: 0  Alcohol Screen: Not on file  Housing: Low Risk (07/25/2024)   Received from [facility]    In the last 12 months, was there a time when you were not able to pay the  mortgage or rent on time?: No    In the past 12 months, how many times have you moved where you were living?: 0    At any time in the past 12 months, were you homeless or living in a shelter (including now)?: No  Utilities: Not At Risk (07/25/2024)   Received from [facility]    In the past 12 months has the electric, gas, oil, or water company threatened to shut off services in your home?: No  Health Literacy: Not on file    PHYSICAL EXAM  Vitals:   10/01/24 1243  BP: 120/82  Pulse: 67  SpO2: 96%  Weight: (!) 331 lb (150.1 kg)  Height: 5' 8 (1.727 m)    Body mass index is 50.33 kg/m.  Generalized: Well developed, in no acute distress  Neurological examination  Mentation: Alert oriented to time, place, history taking. Follows all commands speech and language fluent Cranial nerve II-XII: Pupils were equal round reactive to light. Extraocular movements were full, visual field were full on confrontational test. Facial sensation and strength were normal.  Head turning and shoulder shrug  were normal and symmetric. Motor: The motor testing reveals 5 over 5 strength of all 4 extremities. Good symmetric motor tone is noted throughout.  Sensory: Sensory testing is intact to soft touch on all 4 extremities. No evidence of extinction is noted.  Coordination: Cerebellar testing reveals good finger-nose-finger and heel-to-shin bilaterally.  Gait and station: Gait is normal.   DIAGNOSTIC DATA (LABS, IMAGING, TESTING) - I reviewed patient records, labs, notes, testing and imaging myself where available.  Lab Results  Component Value Date   WBC 6.5 03/22/2021   HGB 13.2 03/22/2021  HCT 39.6 03/22/2021   MCV 81.6 03/22/2021   PLT 334 03/22/2021      Component Value Date/Time   NA 138 03/07/2023 1558   K 4.4 03/07/2023 1558   CL 102 03/07/2023 1558   CO2 16 (L) 03/07/2023 1558   GLUCOSE 70 03/07/2023 1558   GLUCOSE 79 08/22/2022 0802   BUN 9 03/07/2023 1558   CREATININE  0.89 03/07/2023 1558   CREATININE 1.27 (H) 07/05/2020 0717   CALCIUM 9.3 03/07/2023 1558   PROT 7.1 01/10/2022 0924   ALBUMIN 4.2 01/10/2022 0924   AST 16 01/10/2022 0924   ALT 11 01/10/2022 0924   ALKPHOS 45 01/10/2022 0924   BILITOT 0.7 01/10/2022 0924   GFRNONAA >60 03/22/2021 1817   GFRAA 74 (L) 01/13/2014 1830   Lab Results  Component Value Date   CHOL 169 11/27/2022   HDL 63 11/27/2022   LDLCALC 93 11/27/2022   TRIG 66 11/27/2022   CHOLHDL 2.7 11/27/2022   Lab Results  Component Value Date   HGBA1C 5.3 11/27/2022   No results found for: [id number] Lab Results  Component Value Date   TSH 1.000 11/27/2022    Lauraine Born, AGNP-C, DNP 10/01/2024, 1:02 PM [facility] [address], [address] [place], [place] [address] [phone]  "

## 2024-10-01 NOTE — Patient Instructions (Signed)
 Try Nurtec as needed for acute migraine, may combine with tizanidine  and or aleve  for severe migraine.  Take 1 tablet at onset of headache, max is 1 tablet in 24 hours.  Order home sleep study

## 2024-10-10 ENCOUNTER — Encounter

## 2024-10-17 ENCOUNTER — Ambulatory Visit: Admitting: Podiatry

## 2025-02-05 ENCOUNTER — Ambulatory Visit: Admitting: Neurology
